# Patient Record
Sex: Male | Born: 1935 | Race: White | Hispanic: No | Marital: Single | Smoking: Never smoker
Health system: Southern US, Community
[De-identification: ages and names within clinical notes are randomized; demographics above are authoritative.]

## PROBLEM LIST (undated history)

## (undated) DIAGNOSIS — N400 Enlarged prostate without lower urinary tract symptoms: Secondary | ICD-10-CM

## (undated) DIAGNOSIS — E039 Hypothyroidism, unspecified: Secondary | ICD-10-CM

## (undated) DIAGNOSIS — F0390 Unspecified dementia without behavioral disturbance: Secondary | ICD-10-CM

## (undated) DIAGNOSIS — E079 Disorder of thyroid, unspecified: Secondary | ICD-10-CM

## (undated) DIAGNOSIS — I482 Chronic atrial fibrillation, unspecified: Secondary | ICD-10-CM

## (undated) DIAGNOSIS — G4733 Obstructive sleep apnea (adult) (pediatric): Secondary | ICD-10-CM

## (undated) DIAGNOSIS — N1831 Chronic kidney disease, stage 3a: Secondary | ICD-10-CM

## (undated) DIAGNOSIS — I2699 Other pulmonary embolism without acute cor pulmonale: Secondary | ICD-10-CM

## (undated) DIAGNOSIS — E119 Type 2 diabetes mellitus without complications: Secondary | ICD-10-CM

---

## 2008-06-29 ENCOUNTER — Ambulatory Visit: Payer: Self-pay | Admitting: Oncology

## 2008-07-05 ENCOUNTER — Ambulatory Visit: Payer: Self-pay | Admitting: Oncology

## 2008-07-30 ENCOUNTER — Ambulatory Visit: Payer: Self-pay | Admitting: Oncology

## 2008-10-07 ENCOUNTER — Emergency Department: Payer: Self-pay | Admitting: Emergency Medicine

## 2008-10-20 ENCOUNTER — Emergency Department: Payer: Self-pay | Admitting: Emergency Medicine

## 2008-11-15 ENCOUNTER — Ambulatory Visit: Payer: Self-pay | Admitting: Unknown Physician Specialty

## 2013-10-30 ENCOUNTER — Inpatient Hospital Stay: Payer: Self-pay | Admitting: Internal Medicine

## 2013-10-30 LAB — BASIC METABOLIC PANEL
Anion Gap: 9 (ref 7–16)
BUN: 35 mg/dL — ABNORMAL HIGH (ref 7–18)
CALCIUM: 8.8 mg/dL (ref 8.5–10.1)
CO2: 21 mmol/L (ref 21–32)
Chloride: 97 mmol/L — ABNORMAL LOW (ref 98–107)
Creatinine: 2.3 mg/dL — ABNORMAL HIGH (ref 0.60–1.30)
EGFR (African American): 30 — ABNORMAL LOW
GFR CALC NON AF AMER: 26 — AB
GLUCOSE: 377 mg/dL — AB (ref 65–99)
OSMOLALITY: 279 (ref 275–301)
Potassium: 5.9 mmol/L — ABNORMAL HIGH (ref 3.5–5.1)
Sodium: 127 mmol/L — ABNORMAL LOW (ref 136–145)

## 2013-10-30 LAB — HEPATIC FUNCTION PANEL A (ARMC)
Albumin: 3.6 g/dL (ref 3.4–5.0)
Alkaline Phosphatase: 54 U/L
Bilirubin, Direct: 0.2 mg/dL (ref 0.00–0.20)
Bilirubin,Total: 0.7 mg/dL (ref 0.2–1.0)
SGOT(AST): 128 U/L — ABNORMAL HIGH (ref 15–37)
SGPT (ALT): 55 U/L (ref 12–78)
Total Protein: 7.9 g/dL (ref 6.4–8.2)

## 2013-10-30 LAB — CBC WITH DIFFERENTIAL/PLATELET
BASOS ABS: 0 10*3/uL (ref 0.0–0.1)
Basophil %: 0.2 %
Eosinophil #: 0 10*3/uL (ref 0.0–0.7)
Eosinophil %: 0 %
HCT: 48.6 % (ref 40.0–52.0)
HGB: 16.3 g/dL (ref 13.0–18.0)
LYMPHS PCT: 2.4 %
Lymphocyte #: 0.3 10*3/uL — ABNORMAL LOW (ref 1.0–3.6)
MCH: 31.3 pg (ref 26.0–34.0)
MCHC: 33.6 g/dL (ref 32.0–36.0)
MCV: 93 fL (ref 80–100)
MONO ABS: 0.3 x10 3/mm (ref 0.2–1.0)
MONOS PCT: 3.1 %
NEUTROS ABS: 10.5 10*3/uL — AB (ref 1.4–6.5)
Neutrophil %: 94.3 %
Platelet: 163 10*3/uL (ref 150–440)
RBC: 5.2 10*6/uL (ref 4.40–5.90)
RDW: 14.9 % — AB (ref 11.5–14.5)
WBC: 11.1 10*3/uL — AB (ref 3.8–10.6)

## 2013-10-30 LAB — URINALYSIS, COMPLETE
BILIRUBIN, UR: NEGATIVE
Bacteria: NONE SEEN
Glucose,UR: >=500 mg/dL (ref 0–75)
Hyaline Cast: 10
LEUKOCYTE ESTERASE: NEGATIVE
Nitrite: NEGATIVE
Ph: 5 (ref 4.5–8.0)
Protein: 30
Specific Gravity: 1.024 (ref 1.003–1.030)
Squamous Epithelial: NONE SEEN

## 2013-10-30 LAB — TROPONIN I

## 2013-10-30 LAB — CK: CK, Total: 7364 U/L — ABNORMAL HIGH (ref 35–232)

## 2013-10-30 LAB — LIPASE, BLOOD: Lipase: 283 U/L (ref 73–393)

## 2013-10-30 IMAGING — CR DG CHEST 2V
1 series · 2 of 2 positions shown · non-contrast
Comparison: None.

CLINICAL DATA: Weakness.  Fever.

EXAM:
CHEST  2 VIEW

[Series 1: x chest ap · 0.14mm/px · 2 of 2 slices shown]
[im 1/2]
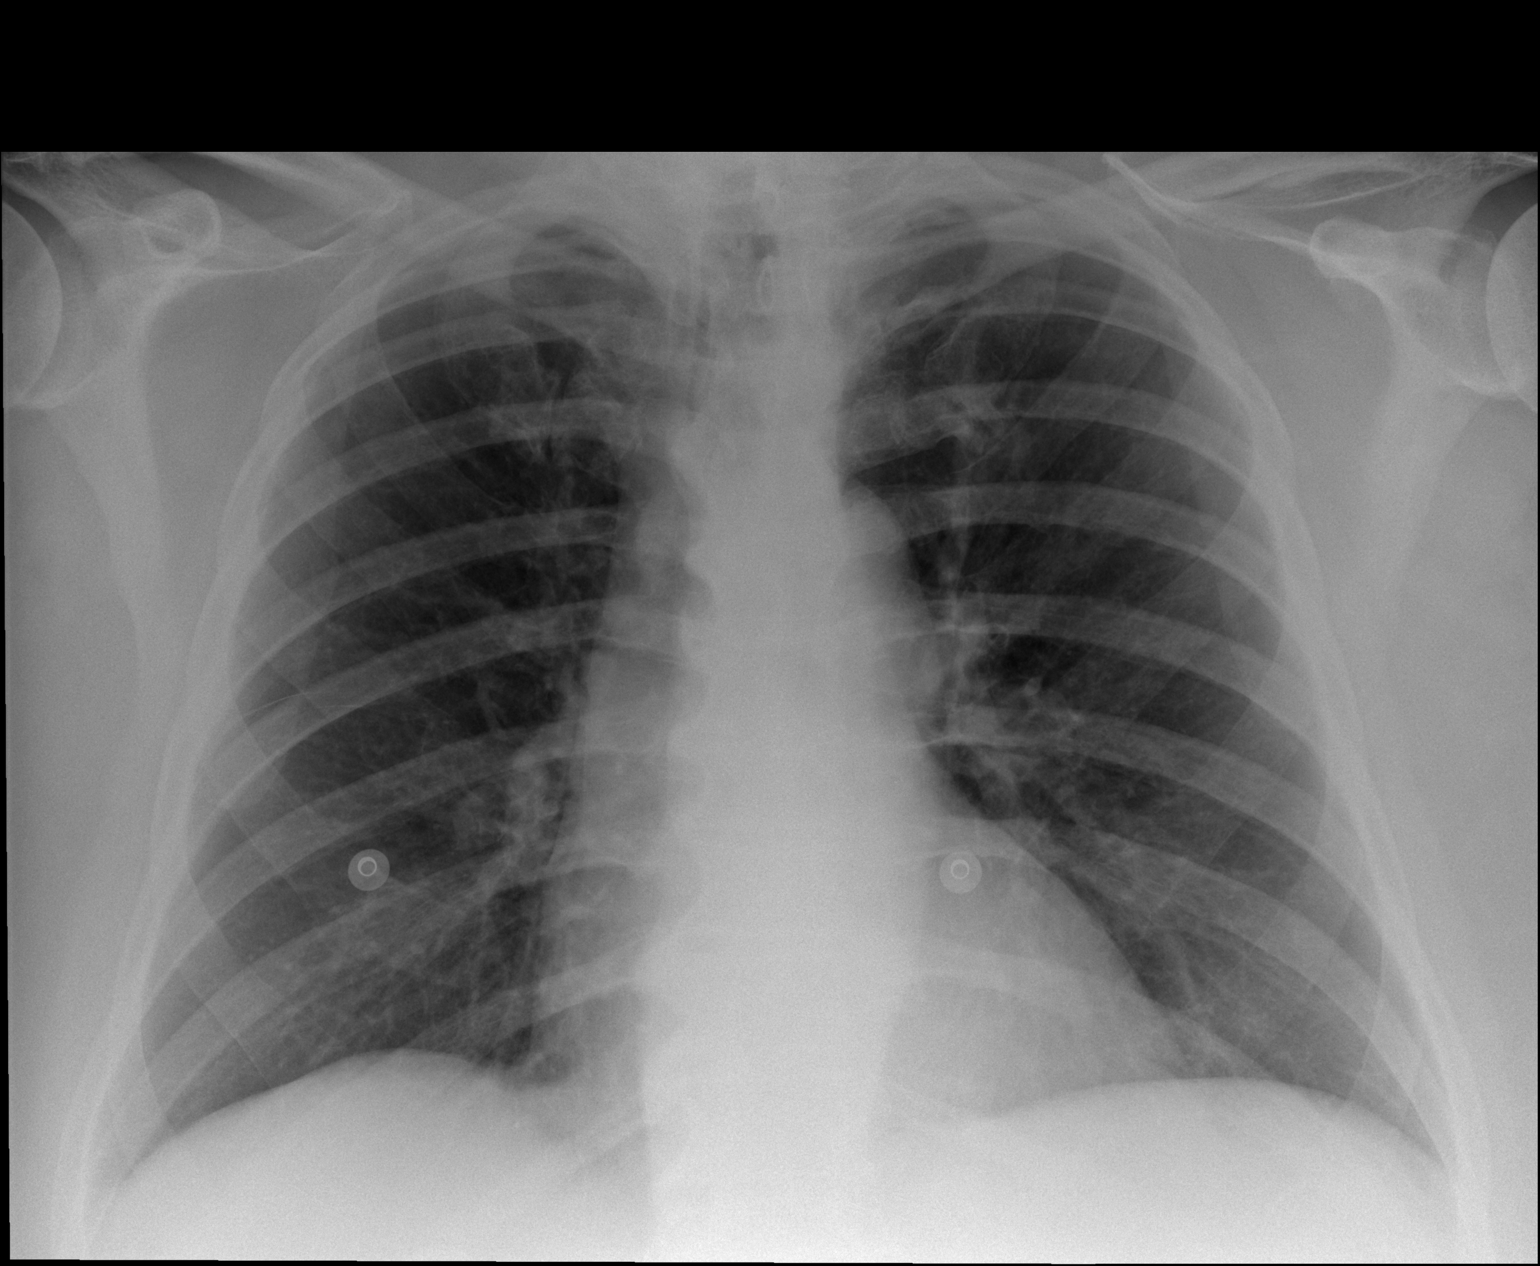
[im 2/2]
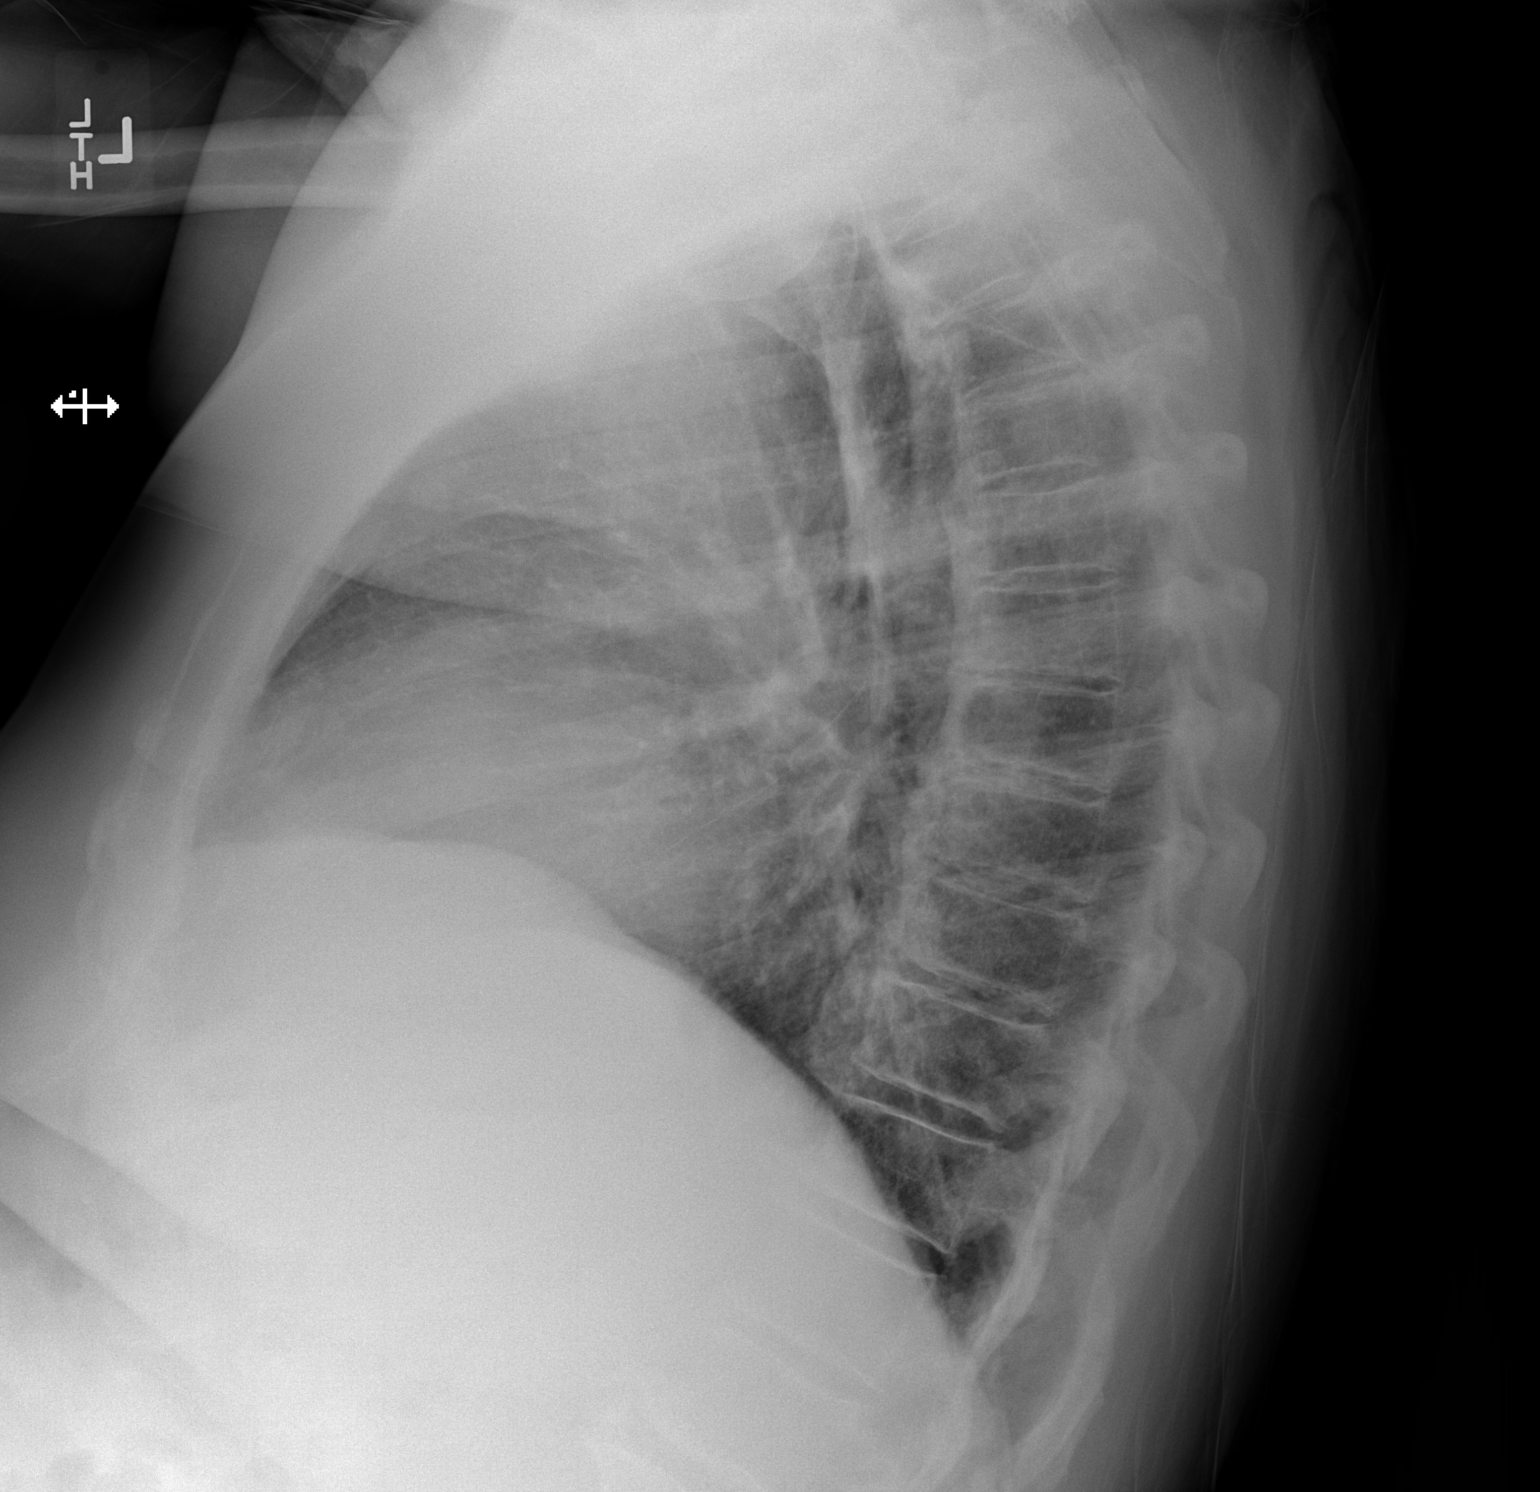

[2 of 2 positions shown; findings below may reference images not displayed]

FINDINGS: The heart size and mediastinal contours are within normal limits.
Both lungs are clear. The bony thorax is intact.
IMPRESSION: No active cardiopulmonary disease.

## 2013-10-31 LAB — CBC WITH DIFFERENTIAL/PLATELET
Basophil #: 0 10*3/uL (ref 0.0–0.1)
Basophil %: 0.3 %
EOS PCT: 0.4 %
Eosinophil #: 0 10*3/uL (ref 0.0–0.7)
HCT: 42.6 % (ref 40.0–52.0)
HGB: 14.2 g/dL (ref 13.0–18.0)
Lymphocyte #: 1.2 10*3/uL (ref 1.0–3.6)
Lymphocyte %: 18.8 %
MCH: 30.9 pg (ref 26.0–34.0)
MCHC: 33.2 g/dL (ref 32.0–36.0)
MCV: 93 fL (ref 80–100)
MONO ABS: 0.4 x10 3/mm (ref 0.2–1.0)
Monocyte %: 6.5 %
Neutrophil #: 4.9 10*3/uL (ref 1.4–6.5)
Neutrophil %: 74 %
Platelet: 127 10*3/uL — ABNORMAL LOW (ref 150–440)
RBC: 4.58 10*6/uL (ref 4.40–5.90)
RDW: 14.6 % — ABNORMAL HIGH (ref 11.5–14.5)
WBC: 6.6 10*3/uL (ref 3.8–10.6)

## 2013-10-31 LAB — COMPREHENSIVE METABOLIC PANEL
ALK PHOS: 43 U/L — AB
AST: 326 U/L — AB (ref 15–37)
Albumin: 2.8 g/dL — ABNORMAL LOW (ref 3.4–5.0)
Anion Gap: 3 — ABNORMAL LOW (ref 7–16)
BUN: 26 mg/dL — ABNORMAL HIGH (ref 7–18)
Bilirubin,Total: 0.5 mg/dL (ref 0.2–1.0)
Calcium, Total: 8 mg/dL — ABNORMAL LOW (ref 8.5–10.1)
Chloride: 104 mmol/L (ref 98–107)
Co2: 25 mmol/L (ref 21–32)
Creatinine: 1.5 mg/dL — ABNORMAL HIGH (ref 0.60–1.30)
EGFR (African American): 50 — ABNORMAL LOW
GFR CALC NON AF AMER: 43 — AB
Glucose: 168 mg/dL — ABNORMAL HIGH (ref 65–99)
Osmolality: 273 (ref 275–301)
Potassium: 4.1 mmol/L (ref 3.5–5.1)
SGPT (ALT): 94 U/L — ABNORMAL HIGH (ref 12–78)
SODIUM: 132 mmol/L — AB (ref 136–145)
Total Protein: 6.4 g/dL (ref 6.4–8.2)

## 2013-10-31 LAB — CK: CK, Total: 13228 U/L — ABNORMAL HIGH (ref 35–232)

## 2013-10-31 LAB — CLOSTRIDIUM DIFFICILE(ARMC)

## 2013-11-01 LAB — CBC WITH DIFFERENTIAL/PLATELET
Basophil #: 0 10*3/uL (ref 0.0–0.1)
Basophil %: 0.2 %
EOS ABS: 0.1 10*3/uL (ref 0.0–0.7)
Eosinophil %: 1 %
HCT: 42 % (ref 40.0–52.0)
HGB: 13.8 g/dL (ref 13.0–18.0)
LYMPHS ABS: 2.3 10*3/uL (ref 1.0–3.6)
Lymphocyte %: 28.8 %
MCH: 30.5 pg (ref 26.0–34.0)
MCHC: 32.8 g/dL (ref 32.0–36.0)
MCV: 93 fL (ref 80–100)
MONOS PCT: 7.7 %
Monocyte #: 0.6 x10 3/mm (ref 0.2–1.0)
Neutrophil #: 4.9 10*3/uL (ref 1.4–6.5)
Neutrophil %: 62.3 %
Platelet: 128 10*3/uL — ABNORMAL LOW (ref 150–440)
RBC: 4.53 10*6/uL (ref 4.40–5.90)
RDW: 14.4 % (ref 11.5–14.5)
WBC: 7.9 10*3/uL (ref 3.8–10.6)

## 2013-11-01 LAB — COMPREHENSIVE METABOLIC PANEL
ANION GAP: 2 — AB (ref 7–16)
Albumin: 2.8 g/dL — ABNORMAL LOW (ref 3.4–5.0)
Alkaline Phosphatase: 45 U/L
BUN: 17 mg/dL (ref 7–18)
Bilirubin,Total: 0.4 mg/dL (ref 0.2–1.0)
CHLORIDE: 106 mmol/L (ref 98–107)
CO2: 27 mmol/L (ref 21–32)
Calcium, Total: 8.5 mg/dL (ref 8.5–10.1)
Creatinine: 1.38 mg/dL — ABNORMAL HIGH (ref 0.60–1.30)
EGFR (Non-African Amer.): 48 — ABNORMAL LOW
GFR CALC AF AMER: 56 — AB
GLUCOSE: 177 mg/dL — AB (ref 65–99)
Osmolality: 276 (ref 275–301)
Potassium: 4.5 mmol/L (ref 3.5–5.1)
SGOT(AST): 277 U/L — ABNORMAL HIGH (ref 15–37)
SGPT (ALT): 98 U/L — ABNORMAL HIGH (ref 12–78)
Sodium: 135 mmol/L — ABNORMAL LOW (ref 136–145)
Total Protein: 6.4 g/dL (ref 6.4–8.2)

## 2013-11-01 LAB — CK: CK, TOTAL: 6962 U/L — AB

## 2013-11-02 LAB — COMPREHENSIVE METABOLIC PANEL
ALK PHOS: 50 U/L
ANION GAP: 14 (ref 7–16)
Albumin: 2.7 g/dL — ABNORMAL LOW (ref 3.4–5.0)
BUN: 15 mg/dL (ref 7–18)
Bilirubin,Total: 0.4 mg/dL (ref 0.2–1.0)
CALCIUM: 8.3 mg/dL — AB (ref 8.5–10.1)
Chloride: 106 mmol/L (ref 98–107)
Co2: 16 mmol/L — ABNORMAL LOW (ref 21–32)
Creatinine: 1.25 mg/dL (ref 0.60–1.30)
EGFR (African American): >60
GFR CALC NON AF AMER: 54 — AB
GLUCOSE: 160 mg/dL — AB (ref 65–99)
Osmolality: 276 (ref 275–301)
POTASSIUM: 4.1 mmol/L (ref 3.5–5.1)
SGOT(AST): 240 U/L — ABNORMAL HIGH (ref 15–37)
SGPT (ALT): 93 U/L — ABNORMAL HIGH (ref 12–78)
Sodium: 136 mmol/L (ref 136–145)
Total Protein: 6.2 g/dL — ABNORMAL LOW (ref 6.4–8.2)

## 2013-11-02 LAB — CBC WITH DIFFERENTIAL/PLATELET
BASOS PCT: 0.4 %
Basophil #: 0 10*3/uL (ref 0.0–0.1)
EOS PCT: 4.6 %
Eosinophil #: 0.3 10*3/uL (ref 0.0–0.7)
HCT: 41.7 % (ref 40.0–52.0)
HGB: 13.6 g/dL (ref 13.0–18.0)
LYMPHS ABS: 2.2 10*3/uL (ref 1.0–3.6)
Lymphocyte %: 30.1 %
MCH: 30.2 pg (ref 26.0–34.0)
MCHC: 32.7 g/dL (ref 32.0–36.0)
MCV: 92 fL (ref 80–100)
MONO ABS: 0.6 x10 3/mm (ref 0.2–1.0)
MONOS PCT: 8.5 %
NEUTROS ABS: 4.2 10*3/uL (ref 1.4–6.5)
Neutrophil %: 56.4 %
Platelet: 132 10*3/uL — ABNORMAL LOW (ref 150–440)
RBC: 4.52 10*6/uL (ref 4.40–5.90)
RDW: 14.8 % — ABNORMAL HIGH (ref 11.5–14.5)
WBC: 7.4 10*3/uL (ref 3.8–10.6)

## 2013-11-02 LAB — CK: CK, Total: 4582 U/L — ABNORMAL HIGH

## 2013-11-06 ENCOUNTER — Inpatient Hospital Stay: Payer: Self-pay | Admitting: Internal Medicine

## 2013-11-06 LAB — URINALYSIS, COMPLETE
Bacteria: NEGATIVE
Bilirubin,UR: NEGATIVE
Blood: NEGATIVE
Glucose,UR: >=500 mg/dL (ref 0–75)
KETONE: NEGATIVE
LEUKOCYTE ESTERASE: NEGATIVE
Nitrite: NEGATIVE
PH: 5 (ref 4.5–8.0)
PROTEIN: NEGATIVE
RBC,UR: NONE SEEN /HPF (ref 0–5)
SPECIFIC GRAVITY: 1.018 (ref 1.003–1.030)

## 2013-11-06 LAB — BASIC METABOLIC PANEL
Anion Gap: 3 — ABNORMAL LOW (ref 7–16)
BUN: 22 mg/dL — ABNORMAL HIGH (ref 7–18)
CALCIUM: 8.9 mg/dL (ref 8.5–10.1)
CHLORIDE: 100 mmol/L (ref 98–107)
CO2: 26 mmol/L (ref 21–32)
Creatinine: 1.54 mg/dL — ABNORMAL HIGH (ref 0.60–1.30)
EGFR (African American): 49 — ABNORMAL LOW
GFR CALC NON AF AMER: 42 — AB
Glucose: 201 mg/dL — ABNORMAL HIGH (ref 65–99)
Osmolality: 268 (ref 275–301)
POTASSIUM: 4.7 mmol/L (ref 3.5–5.1)
Sodium: 129 mmol/L — ABNORMAL LOW (ref 136–145)

## 2013-11-06 LAB — CBC
HCT: 42.6 % (ref 40.0–52.0)
HGB: 14.2 g/dL (ref 13.0–18.0)
MCH: 30.5 pg (ref 26.0–34.0)
MCHC: 33.4 g/dL (ref 32.0–36.0)
MCV: 91 fL (ref 80–100)
Platelet: 201 10*3/uL (ref 150–440)
RBC: 4.67 10*6/uL (ref 4.40–5.90)
RDW: 14.6 % — ABNORMAL HIGH (ref 11.5–14.5)
WBC: 10 10*3/uL (ref 3.8–10.6)

## 2013-11-06 LAB — TROPONIN I: Troponin-I: <0.02 ng/mL

## 2013-11-06 IMAGING — CR DG CHEST 1V PORT
1 series · 1 of 1 positions shown · non-contrast
Comparison: [DATE].

CLINICAL DATA: Lower limb weakness with cough and unregulated blood
sugars. History of hypertension and diabetes.

EXAM:
PORTABLE CHEST - 1 VIEW

[ap]
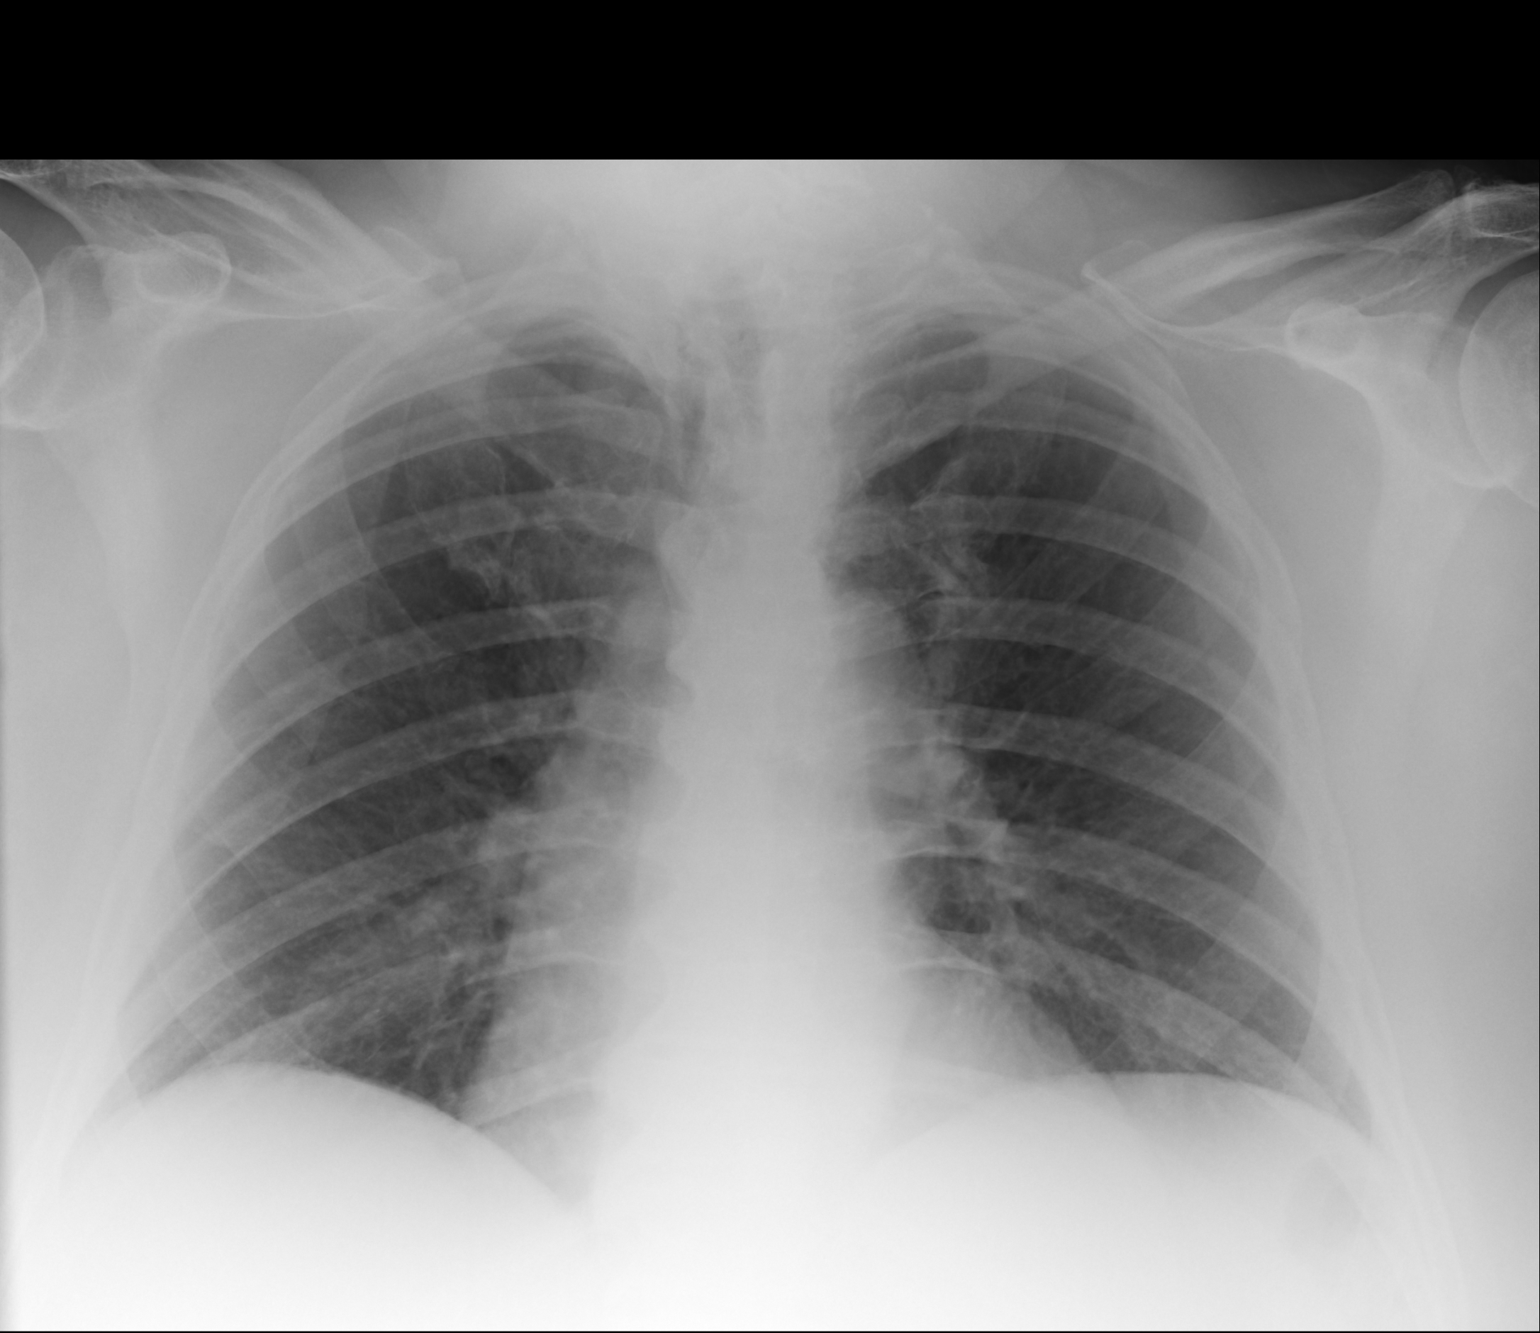

[1 of 1 positions shown; findings below may reference images not displayed]

FINDINGS: [NX] hr. There are persistent low lung volumes with mildly increased
bibasilar atelectasis. No consolidation, edema or significant
pleural effusion is seen. The heart size and mediastinal contours
are stable. There are osteophytes throughout the thoracic spine.
IMPRESSION: Lower lung volumes with increased basilar atelectasis. No evidence
of pneumonia.

## 2013-11-07 LAB — CBC WITH DIFFERENTIAL/PLATELET
BASOS PCT: 0.5 %
Basophil #: 0 10*3/uL (ref 0.0–0.1)
Eosinophil #: 0.1 10*3/uL (ref 0.0–0.7)
Eosinophil %: 1.5 %
HCT: 40.9 % (ref 40.0–52.0)
HGB: 13.8 g/dL (ref 13.0–18.0)
LYMPHS PCT: 25.9 %
Lymphocyte #: 2.1 10*3/uL (ref 1.0–3.6)
MCH: 31.2 pg (ref 26.0–34.0)
MCHC: 33.8 g/dL (ref 32.0–36.0)
MCV: 92 fL (ref 80–100)
Monocyte #: 1.1 x10 3/mm — ABNORMAL HIGH (ref 0.2–1.0)
Monocyte %: 13 %
Neutrophil #: 4.8 10*3/uL (ref 1.4–6.5)
Neutrophil %: 59.1 %
Platelet: 189 10*3/uL (ref 150–440)
RBC: 4.43 10*6/uL (ref 4.40–5.90)
RDW: 14.5 % (ref 11.5–14.5)
WBC: 8.1 10*3/uL (ref 3.8–10.6)

## 2013-11-07 LAB — LIPID PANEL
Cholesterol: 173 mg/dL (ref 0–200)
HDL Cholesterol: 38 mg/dL — ABNORMAL LOW (ref 40–60)
Ldl Cholesterol, Calc: 106 mg/dL — ABNORMAL HIGH (ref 0–100)
Triglycerides: 147 mg/dL (ref 0–200)
VLDL CHOLESTEROL, CALC: 29 mg/dL (ref 5–40)

## 2013-11-07 LAB — BASIC METABOLIC PANEL
Anion Gap: 4 — ABNORMAL LOW (ref 7–16)
BUN: 17 mg/dL (ref 7–18)
CALCIUM: 8.7 mg/dL (ref 8.5–10.1)
CREATININE: 1.37 mg/dL — AB (ref 0.60–1.30)
Chloride: 101 mmol/L (ref 98–107)
Co2: 27 mmol/L (ref 21–32)
EGFR (African American): 56 — ABNORMAL LOW
GFR CALC NON AF AMER: 48 — AB
GLUCOSE: 149 mg/dL — AB (ref 65–99)
Osmolality: 269 (ref 275–301)
Potassium: 4.1 mmol/L (ref 3.5–5.1)
SODIUM: 132 mmol/L — AB (ref 136–145)

## 2013-11-07 LAB — TSH: THYROID STIMULATING HORM: 1.89 u[IU]/mL

## 2013-11-07 LAB — RAPID INFLUENZA A&B ANTIGENS (ARMC ONLY)

## 2013-11-09 LAB — BASIC METABOLIC PANEL
ANION GAP: 7 (ref 7–16)
BUN: 17 mg/dL (ref 7–18)
Calcium, Total: 8.5 mg/dL (ref 8.5–10.1)
Chloride: 99 mmol/L (ref 98–107)
Co2: 25 mmol/L (ref 21–32)
Creatinine: 1.28 mg/dL (ref 0.60–1.30)
EGFR (African American): >60
GFR CALC NON AF AMER: 53 — AB
Glucose: 188 mg/dL — ABNORMAL HIGH (ref 65–99)
OSMOLALITY: 269 (ref 275–301)
POTASSIUM: 4 mmol/L (ref 3.5–5.1)
Sodium: 131 mmol/L — ABNORMAL LOW (ref 136–145)

## 2013-11-10 ENCOUNTER — Encounter: Payer: Self-pay | Admitting: Internal Medicine

## 2013-11-27 ENCOUNTER — Encounter: Payer: Self-pay | Admitting: Internal Medicine

## 2013-12-01 LAB — HEMOGLOBIN A1C: Hemoglobin A1C: 9 % — ABNORMAL HIGH (ref 4.2–6.3)

## 2014-03-13 DIAGNOSIS — N183 Chronic kidney disease, stage 3 unspecified: Secondary | ICD-10-CM | POA: Insufficient documentation

## 2014-11-08 ENCOUNTER — Observation Stay: Payer: Self-pay | Admitting: Internal Medicine

## 2015-01-20 NOTE — H&P (Signed)
PATIENT NAME:  Cory [name], Cory [name] MR#:  [id number] DATE OF BIRTH:  1930-10-04  DATE OF ADMISSION:  10/30/2013  An 81 year old male presents with acute renal failure, diarrhea, and near syncope with fever and chills. He was in his usual state of health, until Friday when he started having some loose bowels and epigastric pain, yesterday it started. Had 3 to 4 bouts of diarrhea this morning. Had fever and chills with more explosive diarrhea creating lethargy and lightheadedness with near syncope. The patient really could not get off the floor. At baseline, creatinine 1.6 and it takes quite a bit of work for him to stand up due to knee arthritis. Now his creatinine is up to 2.6, potassium 5.6 with his continuing diarrheal illness, acute renal failure and rhabdomyolysis with CK of 7000, he will be admitted for further evaluation and treatment.   PAST MEDICAL HISTORY: Diabetes mellitus, insulin requiring. Hyperlipidemia. CKD 3, baseline creatinine 1.6.   PAST SURGICAL HISTORY: Right cataract.   ALLERGIES: None.   MEDICATIONS: Lantus 15 units daily. NovoLog insulin 16 units before breakfast 8 units before supper, Flomax 0.4 mg daily. Aspirin 81 mg daily. Quinapril 20 mg daily, simvastatin 40 mg at bedtime.  Omega 3 1200 mg daily. Calcium vitamin D b.i.d.   SOCIAL HISTORY: Married, occasional alcohol. No smoking.   FAMILY HISTORY: Significant for diabetes, heart disease.   REVIEW OF SYSTEMS: No chest pain, minimal abdominal pain. No current chills, otherwise negative.   PHYSICAL EXAMINATION:  VITAL SIGNS: Blood pressure 118/70, pulse 100 and regular.  HEENT: Normal oropharynx. TMs normal.  NECK: No adenopathy.  LUNGS: Clear.   HEART: Regular rhythm. No audible murmur.  ABDOMEN: Good bowel sounds, soft mild epigastric tenderness. No rebound tenderness.  EXTREMITIES: No edema.  SKIN: No rash.  NEUROLOGIC: Nonfocal. Good hand grip.   LABS: White count elevated. Creatinine at 2.3, potassium 5.6. Chest  x-ray is a normal. Sugar 377, CK 73, 64.   ASSESSMENT AND PLAN: 1. Acute renal failure, related to volume depletion and rhabdomyolysis. Acute volume replacement with normal saline, that should resolve his hyponatremia, hyperkalemia and renal dysfunction. Baseline creatinine 1.6. Follow CKs closely. No sign of acidosis. Elevated SGOT likely from the viral illness. We will re-evaluate that in the morning as well. 2. Diarrheal illness: Checked for C. difficile. Empiric Cipro and Flagyl, IV Protonix.  3. Diabetes-insulin requiring, with very little p.o. intake. We will do sliding scale insulin and follow closely. Overall prognosis is guarded.    ____________________________ Cory PentonMark F. Reise Gladney, Cory [name] mfm:sg D: 10/30/2013 12:25:36 ET T: 10/30/2013 14:01:13 ET JOB#: [id number]  cc: Cory PentonMark F. Dariel Pellecchia, Cory [name], <Dictator> Cory [name] ELECTRONICALLY SIGNED 10/31/2013 8:02

## 2015-01-20 NOTE — H&P (Signed)
PATIENT NAME:  Cory [name], Cory [name] MR#:  [id number] DATE OF BIRTH:  1931/01/19  DATE OF ADMISSION:  11/06/2013  PRIMARY CARE PHYSICIAN: Yates Decamp, MD   CHIEF COMPLAINT: Generalized weakness, frequent falls.   HISTORY OF PRESENT ILLNESS: Cory [name] is an 81 year old morbidly obese male who was recently admitted on 1st of February 2015 and was discharged 5th of February 2015 for frequent falls, rhabdomyolysis, and syncope. The patient was having severe generalized weakness at the time, however, there was some improvement with physical therapy. Concerning this the patient was sent home with home health. During the admission, the patient had rhabdomyolysis with a CK of 7000 and creatinine of 2.6. The patient also states that the patient has poorly controlled blood sugars. Per family, the patient is noncompliant with the diet. Having polyuria, polydipsia. With IV fluids the patient's creatinine improved to 1.54. Since the discharge, the patient was unable to participate in the physical therapy, continued to become more dependent on wife who was unable to care for the patient. Concerning this the patient is brought to the Emergency Department. The patient has mild cough. No productive sputum. The patient is found to have mild hyponatremia of 129. The patient's baseline creatinine is 136. No obvious sign of infections are found.   PAST MEDICAL HISTORY: 1.  Diabetes mellitus, insulin-dependent.  2.  Hypertension.  3.  Hyperlipidemia.  4.  Chronic kidney disease with baseline creatinine of 1.6.  5.  Morbid obesity.   PAST SURGICAL HISTORY: Right cataract.   ALLERGIES: No known drug allergies.   HOME MEDICATIONS: 1.  Lantus 15 units daily.  2.  NovoLog 16 units before breakfast, 8 units before supper.  3.  Flomax 0.4 mg daily.  4.  Aspirin 81 mg daily.  5.  Quinapril 20 mg daily.  6.  Simvastatin 40 mg at bedtime.  7.  Omega 3 fatty acids 1200 mg daily.  8.  Calcium with vitamin D 2 times a day.    SOCIAL HISTORY: No history of smoking, drinking alcohol, or using illicit drugs. Married. Lives with his wife.   FAMILY HISTORY: Diabetes mellitus and hypertension.   REVIEW OF SYSTEMS: CONSTITUTIONAL: Severe generalized weakness. No weight loss.  EYES: No change in vision.  ENT: No change in hearing. No sore throat. RESPIRATORY: Has cough and shortness of breath.  CARDIOVASCULAR: No chest pain, palpitations. GENITOURINARY: No nausea, vomiting, abdominal pain.  ENDOCRINE: Has polyuria and polydipsia. Has diabetes mellitus, poorly controlled.  SKIN: No rash or lesions.  HEMATOLOGIC: No easy bruising or bleeding.  MUSCULOSKELETAL: Has generalized body aches. NEUROLOGIC: Has generalized weakness. No focal signs of any weakness.   PHYSICAL EXAMINATION: GENERAL: This is a well-built, well-nourished morbidly obese male lying down in the bed, not in distress.  VITAL SIGNS: Temperature 98.1, pulse 88, blood pressure 133/60, respiratory rate 16, oxygen saturation is 95% on room air.  HEENT: Head normocephalic, atraumatic. Eyes: No scleral icterus. Conjunctivae normal. Pupils equal and react to light. Extraocular movements are intact. Mucous membranes moist. No pharyngeal erythema.  NECK: Supple. No lymphadenopathy. No JVD. No carotid bruit.  CHEST: Has no focal tenderness.  LUNGS: Bilaterally clear to auscultation.  HEART: S1 and S2 regular. No murmurs are heard.  EXTREMITIES: Left lower extremity has more pedal edema than the right. ABDOMEN: Bowel sounds present. Soft, nontender, nondistended. Could not appreciate any hepatosplenomegaly secondary to patient's obesity. NEUROLOGIC: The patient is alert and oriented to place, person and time. Cranial nerves II through XII intact. Motor 5/5 in  upper and lower extremities.  SKIN: No rash or lesions.  MUSCULOSKELETAL: Good range of motion in all the extremities.   LABORATORY AND DIAGNOSTICS: CBC: WBC 11.1, hemoglobin 16.3, platelet count 163.  CMP: BUN , creatinine 1.54, sodium 129. The rest of all the values are within normal limits.   Chest x-ray, 1 view, portable: Lower lung volumes with increased basilar atelectasis. No evidence of pneumonia.   ASSESSMENT AND PLAN: Cory [name] is an 81 year old male who comes to the Emergency Department with complaints of severe generalized weakness.  1.  Severe debility. Will involve physical therapy and occupational therapy. Most likely secondary to deconditioning although poorly controlled blood sugars also could have contributed to this. The patient's family wants him to be admitted to skilled nursing facility.   2.  Hyponatremia. Most likely secondary to hypovolemic hyponatremia. However, the patient's urine sodium may not be accurate as the patient is on Lasix. Will hold the Lasix and follow up.  3.  Obesity. Counseled with the patient regarding diet and exercise.  4.  Diabetes mellitus, insulin-dependent. Continue the current dose of the insulin and follow up.  5.  Keep the patient on deep vein thrombosis prophylaxis with Lovenox.   TIME SPENT: 50 minutes.  ____________________________ Susa GriffinsPadmaja Jaxxon Naeem, MD pv:sb D: 11/06/2013 23:16:11 ET T: 11/07/2013 08:43:38 ET JOB#: [id number]  cc: Susa GriffinsPadmaja Tahirah Sara, MD, <Dictator> John B. Danne HarborWalker III, MD Clerance LavPADMAJA Ahlivia Salahuddin MD ELECTRONICALLY SIGNED 12/01/2013 1:20

## 2015-01-20 NOTE — Discharge Summary (Signed)
PATIENT NAME:  Cory [name], Cory [name] MR#:  [id number] DATE OF BIRTH:  01-27-1931  DATE OF ADMISSION:  10/30/2013 DATE OF DISCHARGE:  11/03/2013  HISTORY OF PRESENT ILLNESS: Cory [name] was an 81 year old diabetic gentleman, who several days prior to admission, developed chills, fever and diarrhea. He had a near-syncopal episode and presented to the Emergency Room after his wife could not get him up off the floor. In the ER, he was found to have a creatinine of 2.6 with a potassium of 5.6. He also had a CPK of 7000. He was therefore admitted with acute renal failure and rhabdomyolysis.   PAST MEDICAL HISTORY: Notable for insulin-dependent diabetes mellitus type 2 for which he was followed by Dr. Tedd Sias. He also had a history of hyperlipidemia. The patient had known mild chronic kidney disease with a baseline creatinine of 1.6.   ALLERGIES: THE PATIENT HAD NO KNOWN DRUG ALLERGIES.   MEDICATIONS ON ADMISSION: Included Lantus insulin 15 units daily, NovoLog insulin 16 units before breakfast and 8 units before supper, Flomax 0.4 mg daily, aspirin 81 mg daily, quinapril 20 mg daily, simvastatin 40 mg at bedtime, omega-3 fish oil 1200 mg daily and calcium plus vitamin D supplement b.i.d.   PHYSICAL EXAMINATION: The patient's admission vital signs showed a blood pressure of 118/70 with a pulse 100. He was noted to have dry mucous membranes. There was soft mild epigastric tenderness with no rebound. The remainder of the examination was unremarkable, as described by the admitting physician.   DIAGNOSTIC DATA: The patient's admission CBC showed a hemoglobin of 16.3 with a hematocrit of 48.6. White count was 11,100. Platelet count was 163,000. Admission basic metabolic panel showed a random blood sugar of 377. BUN was 35 with a creatinine of 2.3. Sodium was 127. Potassium was 5.9. Chloride was 97. Estimated GFR was 26. Liver function studies were notable only for an SGOT of 128. Lipase was 283. Admission urinalysis showed  1+ ketones. There was 3+ blood on the dipstick, but the microscopic was unremarkable. Admission EKG showed a sinus rhythm with first-degree AV block. There was also bifascicular block. There was evidence of a possible old lateral infarction. Admission chest x-ray showed no active disease.   HOSPITAL COURSE: The patient was admitted to the regular medical floor, where he was rehydrated with IV fluids for his acute renal failure due to the azotemia and also for his rhabdomyolysis. His regular insulin was discontinued, and he was placed on a sliding scale while he was acutely ill. The patient responded rather rapidly to the treatment as noted above. He was seen by physical therapy and ambulated while he was in the hospital. The patient was also seen in consultation by his endocrinologist, who did adjust his insulin doses at the time of discharge.   DISCHARGE DIAGNOSES:  1. Acute viral gastroenteritis.  2. Acute renal failure secondary to #1.  3. Acute rhabdomyolysis.  4. Insulin-dependent type 2 diabetes.   DISPOSITION: The patient is being sent home on a diabetic diet with activity as tolerated. He was set up to be seen by home health at the time of discharge.   DISCHARGE MEDICATIONS: Unfortunately, the computer system is presently frozen, and I cannot load up the discharge medications. An addendum will be attached to this dictation once the system is back up and operable.    ____________________________ Letta Pate. Danne Harbor, MD jbw:lb D: 11/18/2013 11:52:00 ET T: 11/18/2013 12:46:16 ET JOB#: [id number]  cc: Jonny Ruiz B. Danne Harbor, MD, <Dictator> Elmo Putt  III MD ELECTRONICALLY SIGNED 11/21/2013 7:37

## 2015-01-20 NOTE — Discharge Summary (Signed)
PATIENT NAME:  Cory [name], Cory [name] MR#:  [id number] DATE OF BIRTH:  07/24/1931  DATE OF ADMISSION:  11/06/2013 DATE OF DISCHARGE:  11/10/2013  HISTORY OF PRESENT ILLNESS: Mr. Cory [name] is an 81 year old diabetic who had been admitted on the 1st of February and discharged on the 5th after an admission for generalized weakness and frequent falls secondary to a gastroenteritis. He was sent home with home health. He was doing well at the time of discharge and according to his wife was eating and drinking well after discharge, but on the day of admission was feeling extremely weak and she found him on the floor and was unable to get him up. He was brought to the Emergency Room where he was found to have hyponatremia of 129. He was found to be dead weight and was therefore admitted for further therapy and physical reconditioning.   The patient's past medical history was notable for insulin-dependent diabetes mellitus, essential hypertension, hyperlipidemia, chronic kidney disease with a baseline creatinine 1.6, and morbid obesity.   The patient had no known drug allergies.   The patient's medications included aspirin 81 mg daily, Flomax 0.4 mg daily, NovoLog 16 units before breakfast and 8 units before supper, and Lantus 15 units daily. In addition, the patient was on quinapril 20 mg daily, simvastatin 40 mg at bedtime, omega-3 fatty acids 120 mg daily and calcium with vitamin D 1 tablet b.i.d.   The patient's admission vital signs revealed a temperature of 98, a pulse of 88, a blood pressure of 133/60, and a respiratory rate of 16. Pulse ox was 95% on room air. In general, the patient was morbidly obese. He had a little peripheral edema that appeared to be stasis, greater on the right. The remainder of the exam was basically unremarkable with the exception of the patient's generalized weakness.   Admission CBC showed a hemoglobin of 14.2 with a hematocrit of 42.6. White count was 10,000. Platelet count was 201,000.  Admission basic metabolic panel showed a random blood sugar of 201, a BUN of 22, a creatinine of 1.54, a sodium of 129, a potassium of 4.7, a chloride of 100, and a CO2 of 26. Estimated GFR was 42. Admission urinalysis was unremarkable. Screening for influenza A and B was negative. The EKG showed a sinus rhythm with bifascicular block. Premature atrial complexes were noted. Admission chest x-ray showed bibasilar atelectasis, but was otherwise unremarkable.   HOSPITAL COURSE: The patient was admitted to the regular medical floor where he was rehydrated with IV fluids. He was placed on normal saline to bring his sodium back up. He was seen in consultation by physical therapy for reconditioning. After admission he did run a low-grade fever. He was noted to have a productive cough. As noted above, chest x-ray was unremarkable, but he was placed on Levaquin for suspected bronchitis. The patient was kept on a sliding scale of insulin during his hospitalization. He will need to be placed back on his regular insulin regimen once he is back up and ambulatory. The patient's final metabolic panel prior to discharge showed a BUN of 17 with a creatinine 1.28. Sodium was 131. Potassium was 4. Chloride was 99. CO2 was 25.   DISCHARGE DIAGNOSES: 1.  Acute hyponatremia secondary to failure to thrive.  2.  Acute bronchitis.  3.  Insulin-dependent diabetes mellitus.  4.  Hypertension.   DISCHARGE MEDICATIONS: 1.  Aspirin 81 mg daily.  2.  Quinapril 20 mg daily.  3.  Flomax 0.4 mg daily.  4.  Simvastatin 40 mg at bedtime.  5.  Hycodan 5 mL every 6 hours p.r.n. cough.  6.  Levaquin 250 mg daily for an additional 7 days.  7.  Tylenol 650 mg every 4 hours as needed.  8.  Nystatin cream applied to effected area t.i.d.   PLAN: The patient is being transferred on an 1800 calorie low fat, low cholesterol diet. He will be continued on sliding scale until he is back fully ambulatory. It is anticipated that the patient will  eventually return home. He is a FULL code.  ____________________________ Letta Pate. Danne Harbor, MD jbw:sb D: 11/10/2013 07:49:03 ET T: 11/10/2013 08:04:45 ET JOB#: [id number]  cc: Jonny Ruiz B. Danne Harbor, MD, <Dictator> Elmo Putt III MD ELECTRONICALLY SIGNED 11/13/2013 14:24

## 2015-01-20 NOTE — Consult Note (Signed)
PATIENT NAME:  LEVELL, TAVANO MR#:  [id number] DATE OF BIRTH:  May 27, 1931  DATE OF ADMISSION:  10/30/2013 DATE OF CONSULTATION:  11/01/2013  REFERRING PHYSICIAN:  Lisette Grinder, MD CONSULTING PHYSICIAN:  A. Lavone Orn, MD  CHIEF COMPLAINT: Diabetes.   HISTORY OF PRESENT ILLNESS: This is an 81 year old male who was admitted for rhabdomyolysis and acute renal failure on February 01. He had woke from sleep and went to the bathroom. After using the bathroom, he stood and fell to the ground. He was unable to get up. He explains he had severe weakness. He tried for several hours to get up and struggled until his wife found him. Throughout the day prior he was in his usual state of health. He is fairly sedentary but had taken his dog out several times for a brief walk. On being brought to the ER, he was found to have creatinine elevated at 2.6, hyperkalemia and an elevated CK of 7000, in addition to an elevated blood sugar of 377. He has been treated with IV fluids with improvement in his renal function and CK level. For diabetes, he has been given NovoLog insulin sliding scale. As an outpatient, his treatment was Lantus 20 units subQ daily and NovoLog 10 units at breakfast and 8 units at supper. This regimen was recently changed from a regimen of Humalog 75/25 mix, 12 units at breakfast and 15 units at supper; Victoza 0.6 mg daily and glipizide 10 mg daily. He states since his prior medications for diabetes were stopped and replaced with Lantus and NovoLog, sugars have been consistently high. He was having diarrhea with use of Victoza, which has signifcantly improved in recent weeks.  PAST MEDICAL HISTORY: 1.  Type 2 diabetes.  2.  Hyperlipidemia.  3.  Renal insufficiency   PAST SURGICAL HISTORY:   1.  Cataract repair.   CURRENT MEDICATIONS: 1.  NovoLog insulin sliding scale.  2.  Ciprofloxacin 400 mg q.12 hours.  3.  Flagyl 500 mg q.8 hours.  4.  Protonix 40 mg q.12 hours.  5.  Heparin 5000 units  subQ q.12 hours.  6.  Normal saline 0.9% at 150 mL/hour.   ALLERGIES: No known drug allergies.   FAMILY HISTORY: Positive for diabetes and heart disease.   SOCIAL HISTORY: The patient is married. No tobacco use. Drinks occasional alcohol, about once per 2 weeks.   REVIEW OF SYSTEMS:  GENERAL: No weight loss. No fever.  HEENT: No blurred vision. No sore throat.  NECK: No neck pain. No dysphasia.  CARDIAC: No chest pain. No palpitations.  PULMONARY: No cough. No shortness of breath.  ABDOMEN: No abdominal pain. He was having diarrhea in recent weeks; however, that improved since stopping Victoza on 10/03/2013.  ENDOCRINE:  No heat or cold intolerance.  GENITOURINARY: No dysuria or hematuria.  EXTREMITIES: No leg swelling.  HEMATOLOGIC: No easy bruisability or recent bleeding.   PHYSICAL EXAMINATION: VITAL SIGNS: Height 70 inches, weight 263 pounds, BMI 37.8. Temp 97.8, pulse 65, respirations 18, blood pressure 145/78, pulse ox 97% on room air.  GENERAL: Obese white male in no acute distress.  HEENT: EOMI. Oropharynx is clear.  NECK: Supple.  CARDIAC: Regular rate and rhythm. No carotid bruit. PULMONARY: Clear to auscultation bilaterally. No wheeze.  ABDOMEN: Diffusely soft, nontender, nondistended.  EXTREMITIES: No peripheral edema is present. Full range of motion in all extremities.  NEUROLOGIC: No tremor of outstretched hands. No dysarthria. Alert and oriented x 3. PSYCHIATRIC: Calm and cooperative.    LABORATORY DATA:  Glucose 177, creatinine 1.38, sodium 135, potassium 4.5, bicarb 27, chloride 106. EGFR 48. Creatinine kinase 6962. AST 277, ALT 98. Hematocrit 42%, WBC 7.9, platelets 128. C. difficile stool study negative.   ASSESSMENT: An 81 year old male admitted with rhabdomyolysis after a prolonged period of immobility due to a fall. Also with diabetes, which has been uncontrolled since recently changing his outpatient diabetes regimen.   RECOMMENDATIONS: 1.  Recommend  restarting mixed insulin. He was taking Humalog 75/25 mix; however, this is not available here in the hospital. Will instead give NovoLog 70/30 mix and started a dose of 8 units b.i.d. and consider titrating this up if needed. If he tolerates a regular diet and needs additional medication, I can consider adding back the glipizide tomorrow. On discharge, I expect him to continue with the Humalog 75/25 mix, in addition to glipizide. Again, the Lantus and NovoLog insulin will be stopped. He can be continued on his NovoLog insulin sliding scale.   I will follow along with you. Thank you for the kind request for consultation.   ____________________________ A. Lavone Orn, MD ams:dmm D: 11/01/2013 21:18:41 ET T: 11/01/2013 22:08:48 ET JOB#: [id number]  cc: A. Lavone Orn, MD, <Dictator> Sherlon Handing MD ELECTRONICALLY SIGNED 11/02/2013 8:29

## 2015-01-20 NOTE — Consult Note (Signed)
Chief Complaint and History:  Referring Physician Dr. Dan HumphreysWalker   Chief Complaint Diabetes   Allergies:  No Known Allergies:   Assessment/Plan:  Assessment/Plan 81 yo M well known to me with type 2 diabetes -- he was admitted 2/1 for rhabdo and acute renal failure after a fall at home with weakness and remained on the floor for about 4 hours. Reviewed his diabetes regimen. Sugars are now in the 170-240 range. Pt was examined and interviewed.  A/ Uncontrolled diabetes Rhabdo and acute renal failure  P/ Start insulin 70/30 mix with 8 units bid. Plan to DC out pt regimen of lantus + NovoLog and resume use of mix insulin.  Full consult to follow   Electronic Signatures: Raj JanusSolum, Anna M (MD)  (Signed 03-Feb-15 17:40)  Authored: Chief Complaint and History, ALLERGIES, Assessment/Plan   Last Updated: 03-Feb-15 17:40 by Raj JanusSolum, Anna M (MD)

## 2015-01-28 NOTE — H&P (Signed)
PATIENT NAME:  Cory [name], Cory [name] MR#:  [id number] DATE OF BIRTH:  December 21, 1930  DATE OF ADMISSION:  11/08/2014  PRIMARY CARE PROVIDER: Dr. Dan HumphreysWalker.   EMERGENCY DEPARTMENT REFERRING PHYSICIAN: Sheran FavaMark R. Fanny BienQuale, MD   CHIEF COMPLAINT: Weakness.   HISTORY OF PRESENT ILLNESS: The patient is an 81 year old with history of diabetes, hypertension, hyperlipidemia, chronic kidney disease, morbid obesity, who was in his usual state of health yesterday. However, last night, he developed nausea, vomiting, and diarrhea, and had multiple bouts of the diarrhea and throwing up. Earlier today, he was trying to walk and slipped onto the floor, and his wife had a hard time getting him up. The patient had difficulty with getting up; therefore, he was brought in by EMS. The patient feels very weak, but denies any chest pain or shortness of breath. He did have some chills yesterday, but no fever. He denies any abdominal pain.   PAST MEDICAL HISTORY: Significant for diabetes type 2, hypertension, hyperlipidemia, chronic kidney disease with baseline creatinine 1.6, morbid obesity.   PAST SURGICAL HISTORY: Status post right cataract surgery.   ALLERGIES: None.   MEDICATIONS: Multivitamin 1 tablet p.o. daily, Lantus 28 units at bedtime, Humalog 3 times a day, sliding scale, fish oil 1000 mg daily, coenzyme-Q10 200 one tablet p.o. daily, aspirin 81 one tablet p.o. daily.   SOCIAL HISTORY: No history of smoking, drinking alcohol, or illicit drug use. Lives with his wife.   FAMILY HISTORY: Positive for diabetes and hypertension.   REVIEW OF SYSTEMS:  CONSTITUTIONAL: Complains of fatigue, weakness. No weight loss, no weight gain.  EYES: No blurred or double vision.  ENT: No tinnitus. No ear pain. No hearing loss. No seasonal or year-round allergies.  RESPIRATORY: Denies any cough, wheezing, hemoptysis. No COPD.  CARDIOVASCULAR: Denies any chest pain, orthopnea, or edema.  GASTROINTESTINAL: No nausea, vomiting, diarrhea. No  abdominal pain. No hematemesis. No guarding. No IBS. No jaundice.  GENITOURINARY: Denies any dysuria, hematuria, renal colic, or frequency.  ENDOCRINE: Denies any polyuria, nocturia, or thyroid problems.  HEMATOLOGIC AND LYMPHATIC: Denies anemia, easy bruisability, or bleeding.  SKIN: No acne. No rash.  MUSCULOSKELETAL: Denies any pain in the neck, back, or shoulder.  NEUROLOGIC: No CVA, TIA, or seizures.  PSYCHIATRIC: No anxiety, insomnia.   PHYSICAL EXAMINATION: VITAL SIGNS: Temperature 98.5, pulse 96, respirations 16, blood pressure 133/70.  GENERAL: The patient is a well-developed, obese male in no acute distress.  HEENT: Head atraumatic, normocephalic. Pupils equally round, reactive to light and accommodation. There is no conjunctival pallor, no scleral icterus. Nasal exam shows no drainage or ulceration.  OROPHARYNX: Clear without any exudate.  NECK: Supple without any thyromegaly.  CARDIOVASCULAR: Regular rate and rhythm. No murmurs, rubs, clicks, or gallops.  LUNGS: Clear to auscultation bilaterally without any rales, rhonchi, wheezing.  ABDOMEN: Soft, nontender, nondistended. Positive bowel sounds x 4. No hepatosplenomegaly.  EXTREMITIES: No clubbing, cyanosis, edema.  SKIN: No rash.  LYMPH NODES: Nonpalpable.  NEUROLOGICAL: Cranial nerves II through XII grossly intact. No focal deficits.  PSYCHIATRIC: Not anxious or depressed.   LABORATORY DATA: Glucose 191 BUN 18, creatinine 1.58, sodium 134, potassium 5.0, chloride 101, CO2 of 25, calcium 8.4. Troponin less than 0.02. LFTs: Total protein 7.0, albumin 3.2, bilirubin total 0.5, alkaline phosphatase 65, AST 41, ALT 34. WBC 9.2, hemoglobin 13.9, platelet count was 160,000.   ASSESSMENT AND PLAN: The patient is an 81 year old white male who was sick with nausea, vomiting, and diarrhea. This morning, difficulty with ambulation.  1. Generalized weakness,  likely due to dehydration. At this time, we will give him IV fluids, PT  evaluation, and treatment.  2. Diabetes. The patient will be placed on sliding scale, continued on his Lantus.  3. Chronic renal failure. Appears to be stable.  4. Hypertension. We will continue quinapril as taking at home.  5. Miscellaneous. The patient will be on Lovenox for deep vein thrombosis prophylaxis.   TIME SPENT: 45 minutes.    ____________________________ Lacie Scotts Allena Katz, MD shp:mw D: 11/08/2014 17:57:17 ET T: 11/08/2014 18:31:19 ET JOB#: [id number]  cc: Camilla Skeen H. Allena Katz, MD, <Dictator> Charise Carwin MD ELECTRONICALLY SIGNED 11/17/2014 15:53

## 2015-01-28 NOTE — Discharge Summary (Signed)
PATIENT NAME:  Cory [name], Cory [name] MR#:  [id number] DATE OF BIRTH:  13-Oct-1930  DATE OF ADMISSION:  11/08/2014 DATE OF DISCHARGE:  11/09/2014  FINAL DIAGNOSES:  1. Dehydration.  2. Viral gastroenteritis likely as source of number 1.   3. Chronic kidney disease stage III.  4. Adult onset diabetes mellitus, uncontrolled.  5. Hypertension.  6. Hyperlipidemia.  7. Morbid obesity.  8. Osteoarthritis.   HISTORY AND PHYSICAL: Please see dictated admission history and physical.   HOSPITAL COURSE: The patient was admitted after having episodes of nausea, vomiting, diarrhea, although did not have any significant trouble with diarrhea over the course of his hospitalization, nausea and vomiting resolved over the stay as well. Oral intake was noted to be improved. Physical therapy ambulated the patient, and he required a walker. He required rest breaks due to fatigue, although distance-wise he was able to do reasonably well. His wife relates that he has had worsening overall ambulation for some time, and had done well with physical therapy previously but stopped doing the exercises they had shown him as soon as they signed off.   At this time he will be discharged home in stable condition with his physical activity to be up with a walker as tolerated. He should follow a 2 gram sodium, 1800 calorie ADA diet. He should check his sugar 3 times a day and record this. We will anticipate him following up with Dr. Yates DecampJohn Walker in 1-2 weeks. Home health physical therapy and nursing was arranged for the patient to improve ambulation and endurance as well as to monitor blood pressure, glucose, and oral intake.   DISCHARGE MEDICATIONS:  1. Aspirin 81 mg p.o. daily.  2. Simvastatin 40 mg p.o. daily.  3. Coenzyme Q10, 200 mg p.o. daily.  4. Humalog by sliding scale 3 times a day.  5. Multivitamin 1 p.o. daily.  6. Quinapril 20 mg p.o. daily.  7. Tamsulosin 0.4 mg p.o. at bedtime.  8. Lantus 28 units subcutaneous at  bedtime.  9. Ondansetron 4 mg oral disintegrating strips 3 times a day as needed for nausea or vomiting.   He was instructed to hold zinc and fish oil at this time as he has been having some trouble with loose stool chronically intermittently.    ____________________________ Lynnea FerrierBert J. Klein III, MD bjk:bu D: 11/09/2014 13:01:13 ET T: 11/09/2014 17:09:18 ET JOB#: [id number]  cc: Lynnea FerrierBert J. Klein III, MD, <Dictator> John B. Danne HarborWalker III, MD Daniel NonesBERT KLEIN MD ELECTRONICALLY SIGNED 11/13/2014 13:22

## 2015-09-03 ENCOUNTER — Encounter: Payer: Self-pay | Admitting: *Deleted

## 2015-09-03 ENCOUNTER — Emergency Department
Admission: EM | Admit: 2015-09-03 | Discharge: 2015-09-03 | Disposition: A | Discharge status: Home / Self Care | Payer: Medicare Other | Attending: Emergency Medicine | Admitting: Emergency Medicine

## 2015-09-03 DIAGNOSIS — N189 Chronic kidney disease, unspecified: Secondary | ICD-10-CM | POA: Diagnosis not present

## 2015-09-03 DIAGNOSIS — R1084 Generalized abdominal pain: Secondary | ICD-10-CM | POA: Diagnosis not present

## 2015-09-03 DIAGNOSIS — E1165 Type 2 diabetes mellitus with hyperglycemia: Secondary | ICD-10-CM | POA: Diagnosis not present

## 2015-09-03 DIAGNOSIS — R197 Diarrhea, unspecified: Secondary | ICD-10-CM | POA: Insufficient documentation

## 2015-09-03 DIAGNOSIS — R112 Nausea with vomiting, unspecified: Secondary | ICD-10-CM | POA: Insufficient documentation

## 2015-09-03 DIAGNOSIS — E871 Hypo-osmolality and hyponatremia: Secondary | ICD-10-CM | POA: Insufficient documentation

## 2015-09-03 DIAGNOSIS — R739 Hyperglycemia, unspecified: Secondary | ICD-10-CM

## 2015-09-03 DIAGNOSIS — N289 Disorder of kidney and ureter, unspecified: Secondary | ICD-10-CM

## 2015-09-03 HISTORY — DX: Type 2 diabetes mellitus without complications: E11.9

## 2015-09-03 LAB — COMPREHENSIVE METABOLIC PANEL
ALBUMIN: 4.2 g/dL (ref 3.5–5.0)
ALT: 35 U/L (ref 17–63)
ANION GAP: 10 (ref 5–15)
AST: 43 U/L — ABNORMAL HIGH (ref 15–41)
Alkaline Phosphatase: 50 U/L (ref 38–126)
BUN: 27 mg/dL — ABNORMAL HIGH (ref 6–20)
CO2: 22 mmol/L (ref 22–32)
Calcium: 8.8 mg/dL — ABNORMAL LOW (ref 8.9–10.3)
Chloride: 101 mmol/L (ref 101–111)
Creatinine, Ser: 1.55 mg/dL — ABNORMAL HIGH (ref 0.61–1.24)
GFR calc Af Amer: 46 mL/min — ABNORMAL LOW (ref >60)
GFR calc non Af Amer: 40 mL/min — ABNORMAL LOW (ref >60)
GLUCOSE: 265 mg/dL — AB (ref 65–99)
POTASSIUM: 5.6 mmol/L — AB (ref 3.5–5.1)
SODIUM: 133 mmol/L — AB (ref 135–145)
Total Bilirubin: 0.8 mg/dL (ref 0.3–1.2)
Total Protein: 7.4 g/dL (ref 6.5–8.1)

## 2015-09-03 LAB — CBC WITH DIFFERENTIAL/PLATELET
BASOS ABS: 0 10*3/uL (ref 0–0.1)
Basophils Relative: 0 %
Eosinophils Absolute: 0.2 10*3/uL (ref 0–0.7)
Eosinophils Relative: 2 %
HEMATOCRIT: 48 % (ref 40.0–52.0)
Hemoglobin: 16.2 g/dL (ref 13.0–18.0)
LYMPHS ABS: 0.3 10*3/uL — AB (ref 1.0–3.6)
LYMPHS PCT: 4 %
MCH: 31.1 pg (ref 26.0–34.0)
MCHC: 33.7 g/dL (ref 32.0–36.0)
MCV: 92.3 fL (ref 80.0–100.0)
MONO ABS: 0.4 10*3/uL (ref 0.2–1.0)
MONOS PCT: 5 %
NEUTROS ABS: 7.5 10*3/uL — AB (ref 1.4–6.5)
Neutrophils Relative %: 89 %
Platelets: 166 10*3/uL (ref 150–440)
RBC: 5.2 MIL/uL (ref 4.40–5.90)
RDW: 14.4 % (ref 11.5–14.5)
WBC: 8.4 10*3/uL (ref 3.8–10.6)

## 2015-09-03 LAB — BASIC METABOLIC PANEL
Anion gap: 6 (ref 5–15)
BUN: 25 mg/dL — ABNORMAL HIGH (ref 6–20)
CHLORIDE: 106 mmol/L (ref 101–111)
CO2: 22 mmol/L (ref 22–32)
CREATININE: 1.42 mg/dL — AB (ref 0.61–1.24)
Calcium: 7.8 mg/dL — ABNORMAL LOW (ref 8.9–10.3)
GFR calc non Af Amer: 44 mL/min — ABNORMAL LOW (ref >60)
GFR, EST AFRICAN AMERICAN: 52 mL/min — AB (ref >60)
Glucose, Bld: 212 mg/dL — ABNORMAL HIGH (ref 65–99)
Potassium: 5.7 mmol/L — ABNORMAL HIGH (ref 3.5–5.1)
Sodium: 134 mmol/L — ABNORMAL LOW (ref 135–145)

## 2015-09-03 LAB — LIPASE, BLOOD: Lipase: 64 U/L — ABNORMAL HIGH (ref 11–51)

## 2015-09-03 MED ORDER — SODIUM CHLORIDE 0.9 % IV BOLUS (SEPSIS)
1000.0000 mL | Freq: Once | INTRAVENOUS | Status: AC
Start: 2015-09-03 — End: 2015-09-03
  Administered 2015-09-03: 1000 mL via INTRAVENOUS

## 2015-09-03 MED ORDER — LOPERAMIDE HCL 2 MG PO CAPS
4.0000 mg | ORAL_CAPSULE | Freq: Once | ORAL | Status: AC
  Administered 2015-09-03: 4 mg via ORAL
  Filled 2015-09-03: qty 2

## 2015-09-03 MED ORDER — ONDANSETRON HCL 4 MG/2ML IJ SOLN
4.0000 mg | Freq: Once | INTRAMUSCULAR | Status: AC
  Administered 2015-09-03: 4 mg via INTRAVENOUS
  Filled 2015-09-03: qty 2

## 2015-09-03 MED ORDER — TRAMADOL HCL 50 MG PO TABS
25.0000 mg | ORAL_TABLET | Freq: Once | ORAL | Status: AC
  Administered 2015-09-03: 25 mg via ORAL
  Filled 2015-09-03: qty 1

## 2015-09-03 MED ORDER — LOPERAMIDE HCL 2 MG PO CAPS: 4.0000 mg | ORAL_CAPSULE | Freq: Once | ORAL | Status: DC

## 2015-09-03 MED ORDER — SODIUM CHLORIDE 0.9 % IV BOLUS (SEPSIS)
1000.0000 mL | Freq: Once | INTRAVENOUS | Status: AC
  Administered 2015-09-03: 1000 mL via INTRAVENOUS

## 2015-09-03 MED ORDER — ONDANSETRON 4 MG PO TBDP: 4.0000 mg | ORAL_TABLET | Freq: Three times a day (TID) | ORAL | Status: DC | PRN

## 2015-09-03 NOTE — ED Provider Notes (Signed)
[facility] Emergency Department Provider Note  ____________________________________________  Time seen: Approximately 12:35 PM  I have reviewed the triage vital signs and the nursing notes.   HISTORY  Chief Complaint Nausea; Emesis; and Diarrhea    HPI Cory [name] is a 81 y.o. male with a history of DM 2 on insulin presenting with 2 days of nausea vomiting and diarrhea with abdominal pain. Patient states that he ate pork chops and mashed potatoes at [facility] [address] yesterday and approximately 3 hours later developed nonbloody vomiting and nonbloody watery diarrhea. He has had approximate 6 episodes of each. He has had some associated diffuse nonfocal sharp abdominal pains. He has not had any fever, chills, or urinary symptoms. He has not had any chest pain or shortness of breath. He has no known sick contacts and has not traveled outside of the [place].   Past Medical History  Diagnosis Date  . Diabetes mellitus without complication (HCC)     There are no active problems to display for this patient.   History reviewed. No pertinent past surgical history.  Current Outpatient Rx  Name  Route  Sig  Dispense  Refill  . loperamide (IMODIUM) 2 MG capsule   Oral   Take 2 capsules (4 mg total) by mouth once.   15 capsule   0   . ondansetron (ZOFRAN ODT) 4 MG disintegrating tablet   Oral   Take 1 tablet (4 mg total) by mouth every 8 (eight) hours as needed for nausea or vomiting.   20 tablet   0     Allergies Review of patient's allergies indicates no known allergies.  History reviewed. No pertinent family history.  Social History Social History  Substance Use Topics  . Smoking status: Never Smoker   . Smokeless tobacco: None  . Alcohol Use: None    Review of Systems Constitutional: No fever/chills. No lightheadedness or syncope. Eyes: No visual changes. ENT: No sore throat. Cardiovascular: Denies chest pain, palpitations. Respiratory:  Denies shortness of breath.  No cough. Gastrointestinal: Positive generalized abdominal pain.  Positive nausea, positive vomiting.  No diarrhea.  No constipation. Genitourinary: Negative for dysuria. No testicular or scrotal pain. Musculoskeletal: Negative for back pain. Skin: Negative for rash. Neurological: Negative for headaches, focal weakness or numbness.  10-point ROS otherwise negative.  ____________________________________________   PHYSICAL EXAM:  VITAL SIGNS: ED Triage Vitals  Enc Vitals Group     BP 09/03/15 1219 123/66 mmHg     Pulse Rate 09/03/15 1219 108     Resp 09/03/15 1219 18     Temp 09/03/15 1219 98.7 F (37.1 C)     Temp Source 09/03/15 1219 Oral     SpO2 09/03/15 1219 97 %     Weight 09/03/15 1219 290 lb (131.543 kg)     Height 09/03/15 1219  (1.778 m)     Head Cir --      Peak Flow --      Pain Score 09/03/15 1219 3     Pain Loc --      Pain Edu? --      Excl. in GC? --     Constitutional: Alert and oriented. Well appearing and in no acute distress. Answer question appropriately. Eyes: Conjunctivae are normal.  EOMI. Head: Atraumatic. Nose: No congestion/rhinnorhea. Mouth/Throat: Mucous membranes are dry..  Neck: No stridor.  Supple.  No meningismus. Cardiovascular: Normal rate, regular rhythm. No murmurs, rubs or gallops.  Respiratory: Normal respiratory effort.  No retractions.  Lungs CTAB.  No wheezes, rales or ronchi. Gastrointestinal: Abdomen is soft, obese, and minimally distended. There is no reproducible tenderness to palpation. There is no guarding or rebound, no peritoneal signs, and a negative Murphy sign. Musculoskeletal: No LE edema.  Neurologic:  Normal speech and language. No gross focal neurologic deficits are appreciated.  Skin:  Skin is warm, dry and intact. No rash noted. Psychiatric: Mood and affect are normal. Speech and behavior are normal.  Normal judgement  ____________________________________________   LABS (all  labs ordered are listed, but only abnormal results are displayed)  Labs Reviewed  COMPREHENSIVE METABOLIC PANEL - Abnormal; Notable for the following:    Sodium 133 (*)    Potassium 5.6 (*)    Glucose, Bld 265 (*)    BUN 27 (*)    Creatinine, Ser 1.55 (*)    Calcium 8.8 (*)    AST 43 (*)    GFR calc non Af Amer 40 (*)    GFR calc Af Amer 46 (*)    All other components within normal limits  CBC WITH DIFFERENTIAL/PLATELET - Abnormal; Notable for the following:    Neutro Abs 7.5 (*)    Lymphs Abs 0.3 (*)    All other components within normal limits  LIPASE, BLOOD - Abnormal; Notable for the following:    Lipase 64 (*)    All other components within normal limits  BASIC METABOLIC PANEL - Abnormal; Notable for the following:    Sodium 134 (*)    Potassium 5.7 (*)    Glucose, Bld 212 (*)    BUN 25 (*)    Creatinine, Ser 1.42 (*)    Calcium 7.8 (*)    GFR calc non Af Amer 44 (*)    GFR calc Af Amer 52 (*)    All other components within normal limits  URINALYSIS COMPLETEWITH MICROSCOPIC ([facility] ONLY)   ____________________________________________  EKG  ED ECG REPORT I, Rockne MenghiniNorman, Anne-Caroline, the attending physician, personally viewed and interpreted this ECG.   Date: 09/03/2015  EKG Time: 1249  Rate: 106  Rhythm: Poor baseline tracing but it appears the patient has a sinus tachycardia.  Axis: Leftward  Intervals:right bundle branch block  ST&T Change: Nonspecific T-wave inversions in V1. Otherwise no ischemic changes.  EKG is compared to 24/16 where the patient has a similar morphology with long-standing right bundle branch block. No significant changes from prior. ____________________________________________  RADIOLOGY  No results found.  ____________________________________________   PROCEDURES  Procedure(s) performed: None  Critical Care performed: No ____________________________________________   INITIAL IMPRESSION / ASSESSMENT AND PLAN / ED  COURSE  Pertinent labs & imaging results that were available during my care of the patient were reviewed by me and considered in my medical decision making (see chart for details).   81 y.o. male with a history of diabetes presenting with acute onset of nausea vomiting and diarrhea [facility] He has some mild associated diffuse abdominal discomfort and no focality on exam. The most likely etiology is a foodborne illness. A viral GI illness is also possible. There are no findings on exam that are consistent with an acute surgical pathology however I will continue serial abdominal exams after his symptom at a treatment.  ----------------------------------------- 1:23 PM on 09/03/2015 -----------------------------------------  The patient has not had any additional symptoms since his arrival to the hospital. His labs do show a mild bump in his creatinine, which she has had in the past. This is likely due to dehydration  from his nausea and vomiting with diarrhea. He is mildly hyponatremic as well. I will plan to replete his fluids and reevaluate these numbers as I anticipate they will improve. His lipase is 64 and his exam is reassuring and I do not feel this is pancreatitis. He has a normal white blood cell count, no focality in his exam and history that is consistent with a foodborne illness. I do not feel it is in his best interest and it would be risky for his kidneys to do CT imaging at this time.  ----------------------------------------- 1:55 PM on 09/03/2015 -----------------------------------------  ED ECG REPORT I, Rockne Menghini, the attending physician, personally viewed and interpreted this ECG.   Date: 09/03/2015  EKG Time: 1350  Rate: 93  Rhythm: normal sinus rhythm  Axis: Leftward  Intervals:right bundle branch block  ST&T Change: Nonspecific T-wave inversion in V1. There is no peaked ST changes. He has a mildly widened QRS which is typical of his  chronic right bundle branch block.  ----------------------------------------- 2:26 PM on 09/03/2015 -----------------------------------------  The patient continues to feel better. He has tolerated by mouth in the emergency department. I'll plan to repeat his lab work and if he is improving, plan discharge home with close PMD follow-up.  ----------------------------------------- 3:17 PM on 09/03/2015 -----------------------------------------  The patient continues to feel well and has not had any episodes of vomiting or diarrhea here. His repeat labs show a stable potassium but this set of labs was hemolyzed so it is likely lower. He has no hyperkalemic changes on his EKG. His creatinine is improving with IV fluids. I've given him strict instructions about aggressive fluid hydration at home and follow-up with Dr. Dan Humphreys tomorrow for repeat testing and evaluation.  He understands return precautions as well as follow-up instructions.  ____________________________________________  FINAL CLINICAL IMPRESSION(S) / ED DIAGNOSES  Final diagnoses:  Hyperglycemia  Hyponatremia  Acute on chronic renal insufficiency (HCC)  Nausea vomiting and diarrhea      NEW MEDICATIONS STARTED DURING THIS VISIT:  New Prescriptions   LOPERAMIDE (IMODIUM) 2 MG CAPSULE    Take 2 capsules (4 mg total) by mouth once.   ONDANSETRON (ZOFRAN ODT) 4 MG DISINTEGRATING TABLET    Take 1 tablet (4 mg total) by mouth every 8 (eight) hours as needed for nausea or vomiting.     Rockne Menghini, MD 09/03/15 [phone]

## 2015-09-03 NOTE — Discharge Instructions (Signed)
Please use Zofran for nausea and vomiting and loperamide for diarrhea. You may take a clear liquid diet for the next 12-24 hours, then advance to a full liquid diet for one to 2 days. Afterwards, you may advance to a BRAT diet as tolerated.  Please make an appointment to follow up with your primary care physician tomorrow. He will need to have your potassium and her kidney function rechecked.  Please return to the emergency department if you develop abdominal pain, fever, inability to keep down fluids, lightheadedness or fainting, or any other symptoms concerning to you.

## 2015-09-03 NOTE — ED Notes (Signed)
Pt arrives via EMS for nausea, vomiting and diaherra, states he ate at [employer] last night and since then felt bad, states the last time he threw up was about 1 hour ago

## 2016-01-23 DIAGNOSIS — E034 Atrophy of thyroid (acquired): Secondary | ICD-10-CM | POA: Insufficient documentation

## 2016-07-29 DIAGNOSIS — G4733 Obstructive sleep apnea (adult) (pediatric): Secondary | ICD-10-CM | POA: Insufficient documentation

## 2016-10-17 ENCOUNTER — Emergency Department: Payer: Medicare Other

## 2016-10-17 ENCOUNTER — Encounter: Payer: Self-pay | Admitting: Urgent Care

## 2016-10-17 ENCOUNTER — Observation Stay
Admission: EM | Admit: 2016-10-17 | Discharge: 2016-10-20 | Disposition: A | Discharge status: Home / Self Care | Payer: Medicare Other | Attending: Internal Medicine | Admitting: Internal Medicine

## 2016-10-17 DIAGNOSIS — Z8249 Family history of ischemic heart disease and other diseases of the circulatory system: Secondary | ICD-10-CM | POA: Insufficient documentation

## 2016-10-17 DIAGNOSIS — E039 Hypothyroidism, unspecified: Secondary | ICD-10-CM | POA: Insufficient documentation

## 2016-10-17 DIAGNOSIS — E1122 Type 2 diabetes mellitus with diabetic chronic kidney disease: Secondary | ICD-10-CM | POA: Diagnosis not present

## 2016-10-17 DIAGNOSIS — N183 Chronic kidney disease, stage 3 (moderate): Secondary | ICD-10-CM | POA: Insufficient documentation

## 2016-10-17 DIAGNOSIS — S76912A Strain of unspecified muscles, fascia and tendons at thigh level, left thigh, initial encounter: Secondary | ICD-10-CM | POA: Diagnosis not present

## 2016-10-17 DIAGNOSIS — N400 Enlarged prostate without lower urinary tract symptoms: Secondary | ICD-10-CM | POA: Insufficient documentation

## 2016-10-17 DIAGNOSIS — Z794 Long term (current) use of insulin: Secondary | ICD-10-CM | POA: Diagnosis not present

## 2016-10-17 DIAGNOSIS — E785 Hyperlipidemia, unspecified: Secondary | ICD-10-CM | POA: Diagnosis not present

## 2016-10-17 DIAGNOSIS — G4733 Obstructive sleep apnea (adult) (pediatric): Secondary | ICD-10-CM | POA: Diagnosis not present

## 2016-10-17 DIAGNOSIS — M1611 Unilateral primary osteoarthritis, right hip: Secondary | ICD-10-CM | POA: Insufficient documentation

## 2016-10-17 DIAGNOSIS — R2681 Unsteadiness on feet: Secondary | ICD-10-CM

## 2016-10-17 DIAGNOSIS — Z79899 Other long term (current) drug therapy: Secondary | ICD-10-CM | POA: Diagnosis not present

## 2016-10-17 DIAGNOSIS — W000XXA Fall on same level due to ice and snow, initial encounter: Secondary | ICD-10-CM | POA: Diagnosis not present

## 2016-10-17 DIAGNOSIS — R52 Pain, unspecified: Secondary | ICD-10-CM | POA: Diagnosis present

## 2016-10-17 DIAGNOSIS — Y93K1 Activity, walking an animal: Secondary | ICD-10-CM | POA: Insufficient documentation

## 2016-10-17 DIAGNOSIS — M79605 Pain in left leg: Secondary | ICD-10-CM

## 2016-10-17 DIAGNOSIS — Z87891 Personal history of nicotine dependence: Secondary | ICD-10-CM | POA: Diagnosis not present

## 2016-10-17 LAB — BASIC METABOLIC PANEL
ANION GAP: 9 (ref 5–15)
BUN: 25 mg/dL — ABNORMAL HIGH (ref 6–20)
CHLORIDE: 102 mmol/L (ref 101–111)
CO2: 23 mmol/L (ref 22–32)
Calcium: 9.2 mg/dL (ref 8.9–10.3)
Creatinine, Ser: 1.6 mg/dL — ABNORMAL HIGH (ref 0.61–1.24)
GFR calc non Af Amer: 38 mL/min — ABNORMAL LOW (ref >60)
GFR, EST AFRICAN AMERICAN: 44 mL/min — AB (ref >60)
Glucose, Bld: 177 mg/dL — ABNORMAL HIGH (ref 65–99)
POTASSIUM: 4.2 mmol/L (ref 3.5–5.1)
SODIUM: 134 mmol/L — AB (ref 135–145)

## 2016-10-17 IMAGING — CR DG FEMUR 2+V*L*
1 series · 5 of 5 positions shown · non-contrast
Comparison: None.

CLINICAL DATA: Distal left femoral pain after slip and fall on ice

EXAM:
LEFT FEMUR 2 VIEWS

[Series 1: dg femur min 2 views left · 0.14mm/px · 5 of 5 slices shown]
[im 1/5]
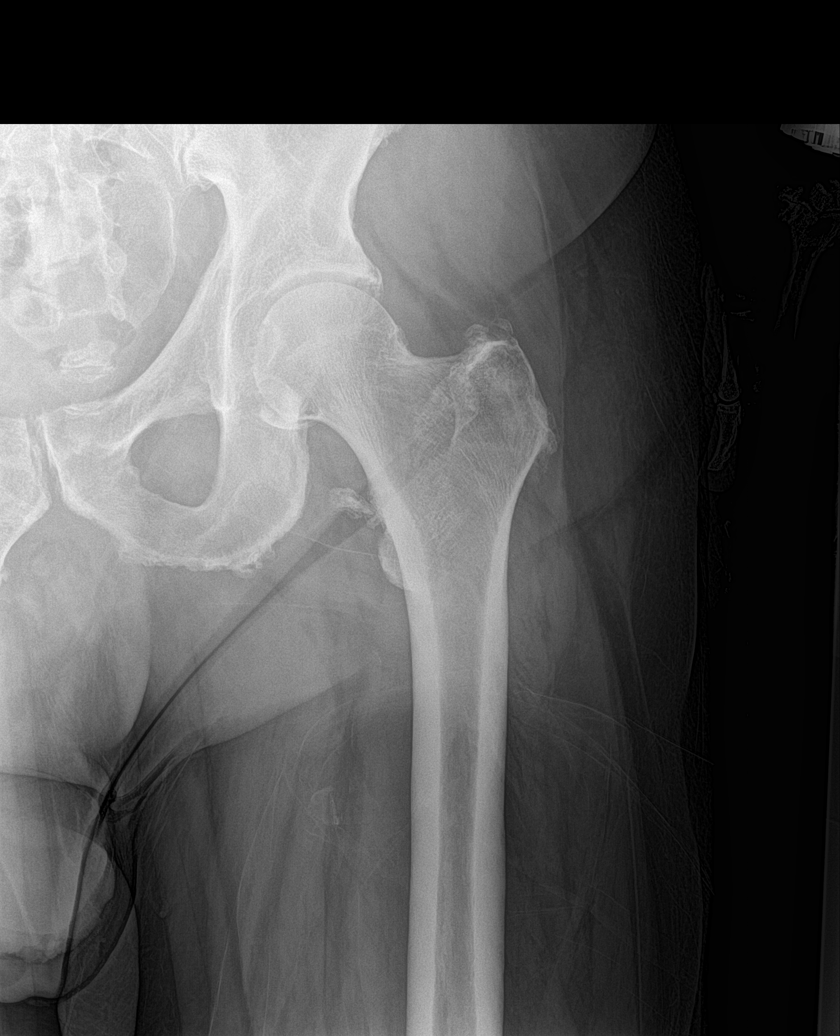
[im 2/5]
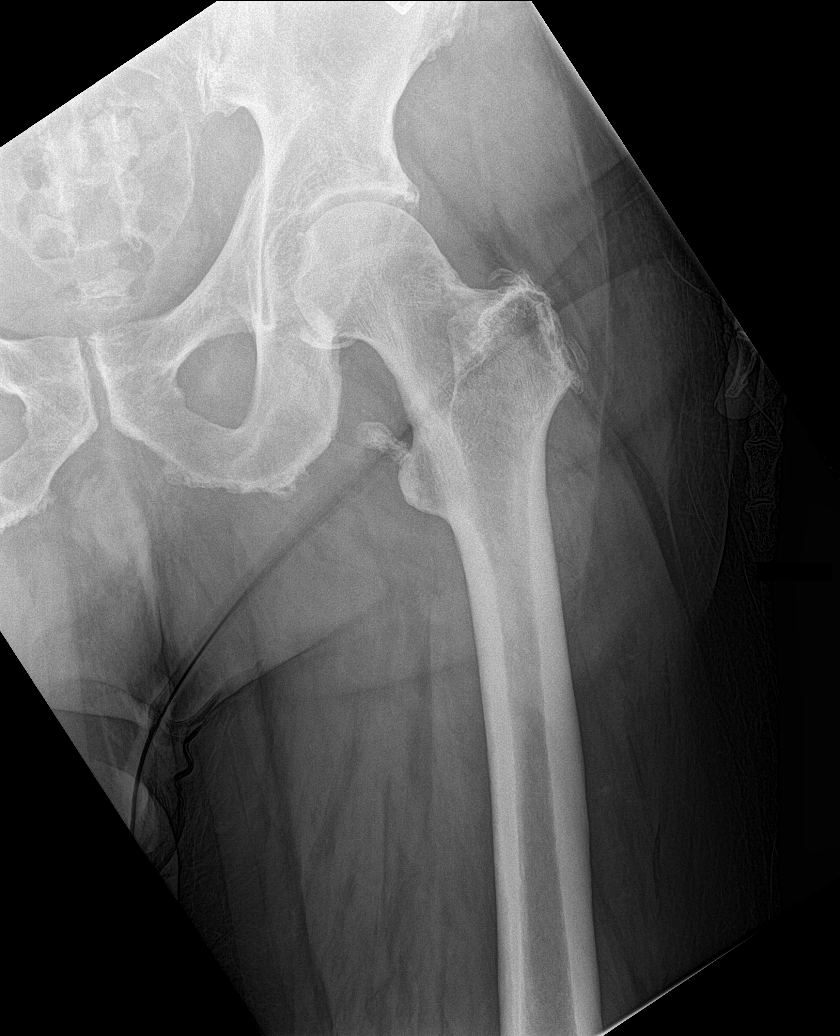
[im 3/5]
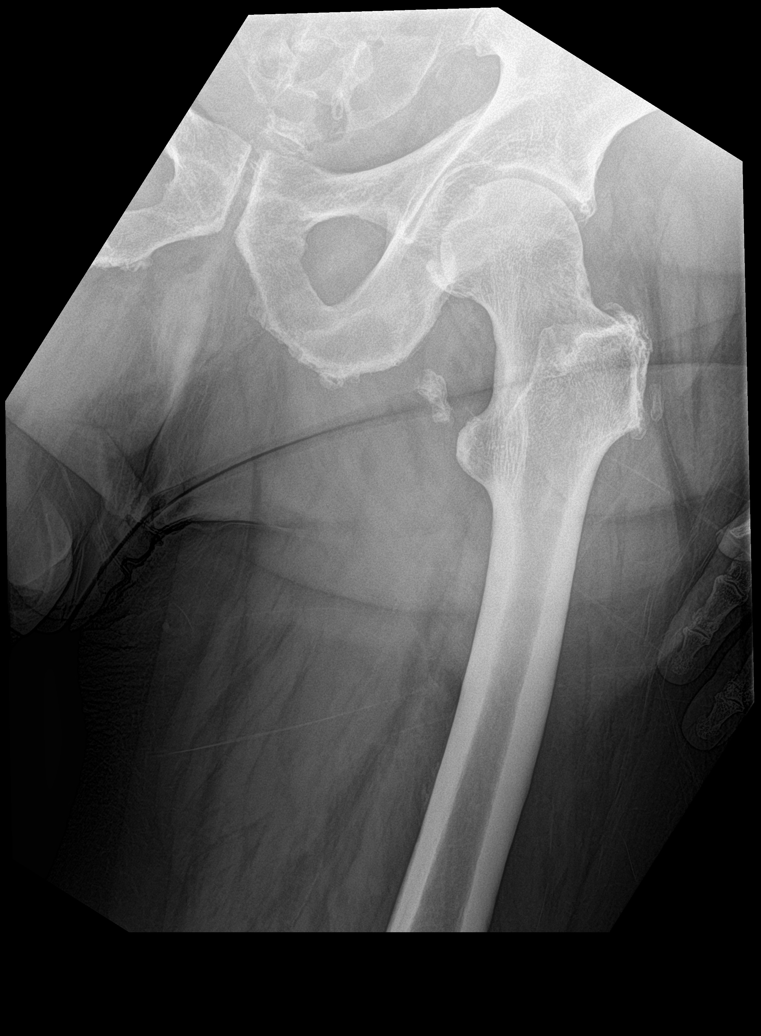
[im 4/5]
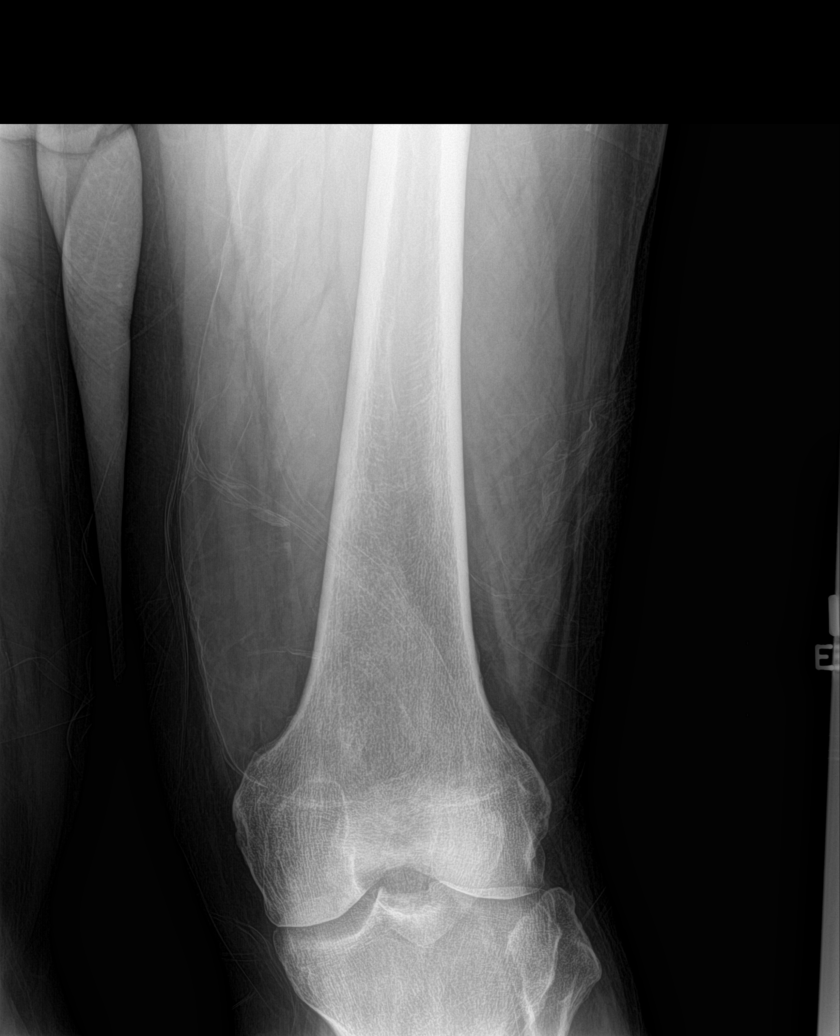
[im 5/5]
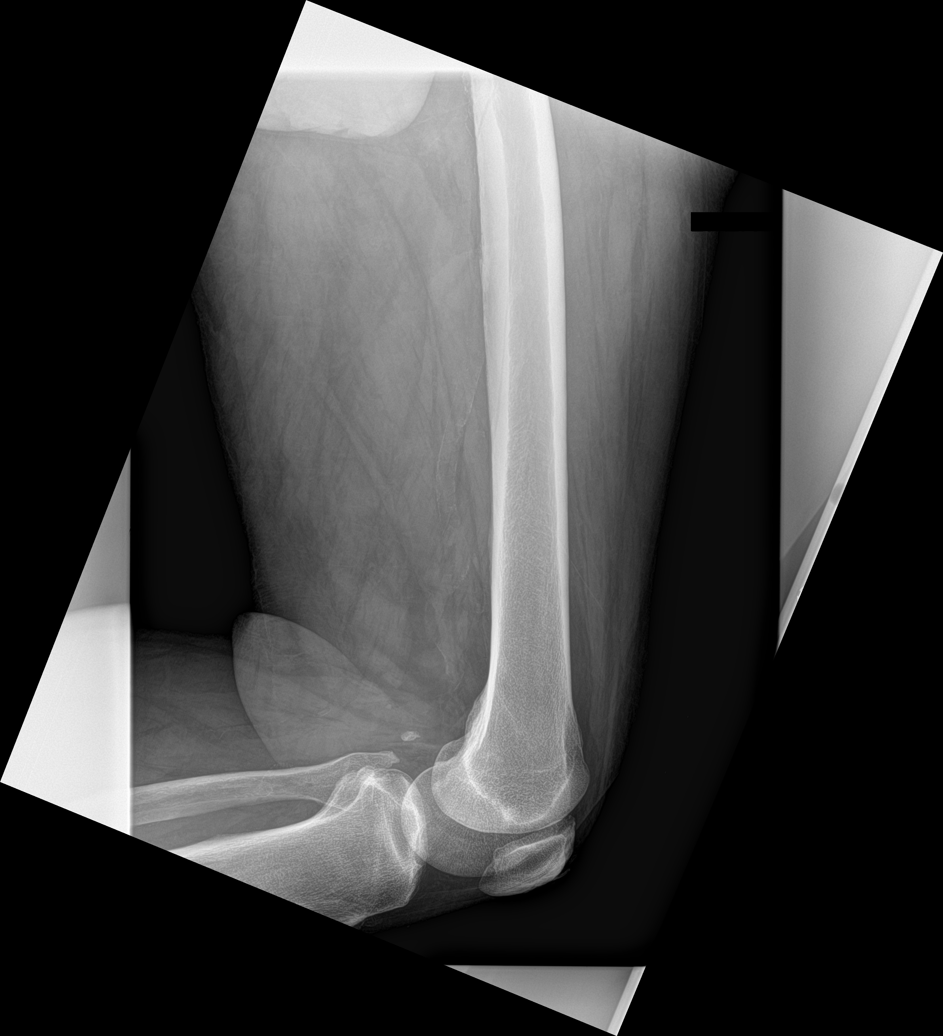

[5 of 5 positions shown; findings below may reference images not displayed]

FINDINGS: There is no evidence of acute left femoral fracture. The hip and
knee joints are maintained. No joint effusion. Calcific tendinopathy
along the course of the left iliopsoas. Enthesophytes are noted off
the left greater trochanter and ischial tuberosity. The pubic rami
are intact. Soft tissues are unremarkable.
IMPRESSION: No acute fracture.  Iliopsoas calcific tendinopathy.

## 2016-10-17 MED ORDER — OXYCODONE-ACETAMINOPHEN 5-325 MG PO TABS: 1.0000 | ORAL_TABLET | Freq: Four times a day (QID) | ORAL | 0 refills | Status: DC | PRN

## 2016-10-17 MED ORDER — MORPHINE SULFATE (PF) 4 MG/ML IV SOLN
4.0000 mg | Freq: Once | INTRAVENOUS | Status: AC
  Administered 2016-10-17: 4 mg via INTRAVENOUS

## 2016-10-17 MED ORDER — ONDANSETRON HCL 4 MG/2ML IJ SOLN
INTRAMUSCULAR | Status: AC
  Administered 2016-10-17: 4 mg via INTRAVENOUS
  Filled 2016-10-17: qty 2

## 2016-10-17 MED ORDER — MORPHINE SULFATE (PF) 4 MG/ML IV SOLN
INTRAVENOUS | Status: AC
  Administered 2016-10-17: 4 mg via INTRAVENOUS
  Filled 2016-10-17: qty 1

## 2016-10-17 MED ORDER — ONDANSETRON HCL 4 MG/2ML IJ SOLN
4.0000 mg | Freq: Once | INTRAMUSCULAR | Status: AC
  Administered 2016-10-17: 4 mg via INTRAVENOUS

## 2016-10-17 MED ORDER — SODIUM CHLORIDE 0.9 % IV SOLN
Freq: Once | INTRAVENOUS | Status: AC
  Administered 2016-10-17: 23:00:00 via INTRAVENOUS

## 2016-10-17 NOTE — ED Notes (Signed)
Dr. Darnelle CatalanMalinda to bedside to discuss POC with this patient and his spouse.

## 2016-10-17 NOTE — ED Notes (Signed)
MD notified this RN that patient's results from the performed plain films of his femur were negative. MD wanting patient to be gotten OOB and ambulated using a walker for assistance. RN to bedside to make patient aware. Patient states, "I cant do that. There is a muscle or tendon or something in there that is torn. I dont know about me walking". Of note, patient is recently s/p two doses of Morphine, with the last dose being given less than 15 minutes ago. Will give patient some additional time before attempting ambulation trial.

## 2016-10-17 NOTE — ED Notes (Signed)
PO fluids provided at this time per patient request; 240cc water consumed.

## 2016-10-17 NOTE — ED Notes (Signed)
Patient back from radiology. Patient states, "They moved me around so much over there. They had me moving in ways I told them hurt. They undid everything that you have done. My pain is back to an 8 now". MD made aware. RN reviewed BMP results with MD. VSS; current BO 128/87. MD with [facility] for Morphine 4mg  IVP; [facility] and carried by emergency department nursing staff.

## 2016-10-17 NOTE — Discharge Instructions (Signed)
Try to keep the leg up as much as possible. Use a walker to get around when you need to. Take the Percocet 1 pill 4 times a day as needed for pain. Remember it can make you constipated and woozy. Be careful not to fall. Do not drive on the Percocet. Use ice tonight on the area and heat tomorrow. He can put heat 20 minutes every hour or so. Do not fall asleep with the use of ice or the heat on his you can get burns. I would not apply either for more than 20 minutes at a time. Return for worse pain swelling or bruising or any other problems. Follow-up with the orthopedic surgeon. Call his office Monday morning to tell him your in the ER and have severe pain behind your leg after fall. He should be able to arrange to see you in the week.

## 2016-10-17 NOTE — ED Notes (Signed)
Pain reassessed; currently 3/10. Patient remains CAO x 4; very talkative. Will continue to monitor.

## 2016-10-17 NOTE — ED Triage Notes (Signed)
Patient presents to the ED via EMS from home. Patient with c/o pain in his hamstring area. He reports that he was walking the dog and slipped on the ice, which caused him to fall. Patient reports inability to bear weight. EMS placed IV and gave Toradol 60mg  IVP. (+) PMS noted distal to injury.

## 2016-10-17 NOTE — H&P (Signed)
History and Physical   [facility] [place] @ [facility] Admission History and Physical [employer], D.O.    Patient Name: Cory [name] MR#: [id number] Date of Birth: 12/03/1930 Date of Admission: 10/17/2016  Referring MD/NP/PA: Dr. Darnelle Catalan Primary Care Physician: Rafael Bihari, MD Outpatient Specialists: Dr. Tedd Sias  Patient coming from: Home  Chief Complaint: Leg pain s/p fall  HPI: Cory [name] is a 83 y.o. male with a known history of diabetes, hyperlipidemia, hypothyroidism, osteoarthritis the right hip, CK D stage III and obstructive sleep apnea was in a usual state of health until this afternoon when he sustained a mechanical fall. Patient states that his hat fell of his head and he went to try to catch it and he fell forward landing on his hands and knees. He experienced immediate excruciating pain behind the left leg about 6 inches above the posterior knee.Marland Kitchen He reports that he was able to crawl to his arrival he called for his wife. He was unable to stand or walk and was transported to the emergency department via ambulance. Patient denies any preceding symptoms as dizziness, lightheadedness, chest pain, shortness of breath. Patient denies fevers/chills, weakness, dizziness, chest pain, shortness of breath, N/V/C/D, abdominal pain, dysuria/frequency, changes in mental status.    Otherwise there has been no change in status. Patient has been taking medication as prescribed and there has been no recent change in medication or diet.  No recent antibiotics.  There has been no recent illness, hospitalizations, travel or sick contacts.    In the emergency department the patient received 8 mg of morphine and 1 tab of Roxicet 5/325 as well as Zofran and normal saline. When attempting to mobilize patient continued to suffer from severe pain and was unable to ambulate therefore hospitalists were contacted requesting admission for pain control.   Review of Systems:  CONSTITUTIONAL:  No fever/chills, fatigue, weakness, weight gain/loss, headache. EYES: No blurry or double vision. ENT: No tinnitus, postnasal drip, redness or soreness of the oropharynx. RESPIRATORY: No cough, dyspnea, wheeze.  No hemoptysis.  CARDIOVASCULAR: No chest pain, palpitations, syncope, orthopnea. No lower extremity edema.  GASTROINTESTINAL: No nausea, vomiting, abdominal pain, diarrhea, constipation.  No hematemesis, melena or hematochezia. GENITOURINARY: No dysuria, frequency, hematuria. ENDOCRINE: No polyuria or nocturia. No heat or cold intolerance. HEMATOLOGY: No anemia, bruising, bleeding. INTEGUMENTARY: No rashes, ulcers, lesions. MUSCULOSKELETAL: No arthritis, gout, dyspnea. Positive posterior left leg pain NEUROLOGIC: No numbness, tingling, ataxia, seizure-type activity, weakness. PSYCHIATRIC: No anxiety, depression, insomnia.   Past Medical History:  Diagnosis Date  . Diabetes mellitus without complication (HCC)     History reviewed. No pertinent surgical history.  Patient smoked pipes and cigars for 15 years and quit in 1975 and drinks about one drink per month.  No Known Allergies  Father type 2 diabetes, heart disease, died at age 39 Sr. with heart disease died at age 52 of an MI Family history has been reviewed and confirmed with patient.   Prior to Admission medications   Medication Sig Start Date End Date Taking? Authorizing Provider  co-enzyme Q-10 30 MG capsule Take 30 mg by mouth daily.   Yes Historical Provider, MD  HUMALOG KWIKPEN 100 UNIT/ML KiwkPen Inject 25-30 Units into the skin 2 (two) times daily. 25 units with lunch and 30 units at supper. 08/18/16  Yes Historical Provider, MD  LANTUS SOLOSTAR 100 UNIT/ML Solostar Pen Inject 45 Units into the skin daily at 10 pm.  08/18/16  Yes Historical Provider, MD  pyridoxine (  B-6) 100 MG tablet Take 100 mg by mouth daily.   Yes Historical Provider, MD  quinapril (ACCUPRIL) 20 MG tablet Take 1 tablet by mouth daily.  09/21/16  Yes Historical Provider, MD  simvastatin (ZOCOR) 40 MG tablet Take 1 tablet by mouth daily. 09/26/16  Yes Historical Provider, MD  SYNTHROID 50 MCG tablet Take 1 tablet by mouth daily. 09/26/16  Yes Historical Provider, MD  tamsulosin (FLOMAX) 0.4 MG CAPS capsule Take 1 capsule by mouth daily. 07/29/16  Yes Historical Provider, MD  traMADol (ULTRAM) 50 MG tablet Take 50 mg by mouth every 6 (six) hours as needed.   Yes Historical Provider, MD  loperamide (IMODIUM) 2 MG capsule Take 2 capsules (4 mg total) by mouth once. Patient not taking: Reported on 10/17/2016 09/03/15   Rockne Menghini, MD  ondansetron (ZOFRAN ODT) 4 MG disintegrating tablet Take 1 tablet (4 mg total) by mouth every 8 (eight) hours as needed for nausea or vomiting. Patient not taking: Reported on 10/17/2016 09/03/15   Rockne Menghini, MD  oxyCODONE-acetaminophen (ROXICET) 5-325 MG tablet Take 1 tablet by mouth every 6 (six) hours as needed. 10/17/16 10/17/17  Arnaldo Natal, MD    Physical Exam: Vitals:   10/17/16 2013 10/17/16 2030 10/17/16 2100 10/17/16 2130  BP: 128/87 140/67 137/63 131/66  Pulse:  67 63 67  Resp:      Temp:      TempSrc:      SpO2:  93% 97% 98%  Weight:      Height:        GENERAL: 83 y.o.-year-old Obese white male patient, well-developed, well-nourished lying in the bed in no acute distress.  Pleasant and cooperative.   HEENT: Head atraumatic, normocephalic. Pupils equal, round, reactive to light and accommodation. No scleral icterus. Extraocular muscles intact. Nares are patent. Oropharynx is clear. Mucus membranes moist. NECK: Supple, full range of motion. No JVD, no bruit heard. No thyroid enlargement, no tenderness, no cervical lymphadenopathy. CHEST: Normal breath sounds bilaterally. No wheezing, rales, rhonchi or crackles. No use of accessory muscles of respiration.  No reproducible chest wall tenderness.  CARDIOVASCULAR: S1, S2 normal. No murmurs, rubs, or gallops. Cap refill  <2 seconds. Pulses intact distally.  ABDOMEN: Soft, nondistended, nontender. No rebound, guarding, rigidity. Normoactive bowel sounds present in all four quadrants. No organomegaly or mass. EXTREMITIES: There is point tenderness in the mid point of the left hamstring. There is no bony tenderness. Range of motion is limited secondary to pain. There is no hematoma, ecchymosis or bruising. He has full range of motion and 5 out of 5 strength in bilateral lower extremities in all directions. No pedal edema, cyanosis, or clubbing. No calf tenderness or Homan's sign.  NEUROLOGIC: The patient is alert and oriented x 3. Cranial nerves II through XII are grossly intact with no focal sensorimotor deficit. Muscle strength 5/5 in all extremities. Sensation intact. Gait not checked. PSYCHIATRIC:  Normal affect, mood, thought content. SKIN: Warm, dry, and intact without obvious rash, lesion, or ulcer.   Labs on Admission:  CBC: No results for input(s): WBC, NEUTROABS, HGB, HCT, MCV, PLT in the last 168 hours. Basic Metabolic Panel:  Recent Labs Lab 10/17/16 1942  NA 134*  K 4.2  CL 102  CO2 23  GLUCOSE 177*  BUN 25*  CREATININE 1.60*  CALCIUM 9.2   GFR: Estimated Creatinine Clearance: 44.5 mL/min (by C-G formula based on SCr of 1.6 mg/dL (H)). Liver Function Tests: No results for input(s): AST, ALT, ALKPHOS, BILITOT,  PROT, ALBUMIN in the last 168 hours. No results for input(s): LIPASE, AMYLASE in the last 168 hours. No results for input(s): AMMONIA in the last 168 hours. Coagulation Profile: No results for input(s): INR, PROTIME in the last 168 hours. Cardiac Enzymes: No results for input(s): CKTOTAL, CKMB, CKMBINDEX, TROPONINI in the last 168 hours. BNP (last 3 results) No results for input(s): PROBNP in the last 8760 hours. HbA1C: No results for input(s): HGBA1C in the last 72 hours. CBG: No results for input(s): GLUCAP in the last 168 hours. Lipid Profile: No results for input(s): CHOL,  HDL, LDLCALC, TRIG, CHOLHDL, LDLDIRECT in the last 72 hours. Thyroid Function Tests: No results for input(s): TSH, T4TOTAL, FREET4, T3FREE, THYROIDAB in the last 72 hours. Anemia Panel: No results for input(s): VITAMINB12, FOLATE, FERRITIN, TIBC, IRON, RETICCTPCT in the last 72 hours. Urine analysis:    Component Value Date/Time   COLORURINE YELLOW 11/06/2013 1847   APPEARANCEUR Clear 11/06/2013 1847   LABSPEC 1.018 11/06/2013 1847   PHURINE 5.0 11/06/2013 1847   GLUCOSEU >=500 11/06/2013 1847   HGBUR Negative 11/06/2013 1847   BILIRUBINUR Negative 11/06/2013 1847   KETONESUR Negative 11/06/2013 1847   PROTEINUR Negative 11/06/2013 1847   NITRITE Negative 11/06/2013 1847   LEUKOCYTESUR Negative 11/06/2013 1847   Sepsis Labs: @LABRCNTIP (procalcitonin:4,lacticidven:4) )No results found for this or any previous visit (from the past 240 hour(s)).   Radiological Exams on Admission: Dg Femur Min 2 Views Left  Result Date: 10/17/2016 CLINICAL DATA:  Distal left femoral pain after slip and fall on ice EXAM: LEFT FEMUR 2 VIEWS COMPARISON:  None. FINDINGS: There is no evidence of acute left femoral fracture. The hip and knee joints are maintained. No joint effusion. Calcific tendinopathy along the course of the left iliopsoas. Enthesophytes are noted off the left greater trochanter and ischial tuberosity. The pubic rami are intact. Soft tissues are unremarkable. IMPRESSION: No acute fracture.  Iliopsoas calcific tendinopathy. Electronically Signed   By: Tollie Eth M.D.   On: 10/17/2016 20:27    Assessment/Plan Active Problems:   Intractable pain    This is a 83 y.o. male with a history ofdiabetes, hyperlipidemia, hypothyroidism, osteoarthritis the right hip, CK D stage III and obstructive sleep apnea  now being admitted with: 1. Intractable pain left thigh pain s/p mechanical fall.  -There is no evidence of any tenderness or bony injury. However given patient's gait instability and severe  intractable pain we will admit for observation - Pain control - PT eval in AM -Consider MRI if pain not adequately controlled and or if there is evidence of soft tissue injury.  2. Hypotension - Gentle IVFs  3. DM - Continue home meds including Humalog, Lantus, quinapril  4. Hyperlipidemia -Continue Zocor  5. History of hypothyroid -Continue Synthroid  6. History of BPH -Continue Flomax  7. History of osteoarthritis of the hip -Continue Ultram -Recommend discontinuing Aleve given history of chronic kidney disease  8. CK D stage III -Creatinine near baseline -Gentle IV fluid hydration and repeat BMP in a.m.  Admission status: Observation IV Fluids: NS Diet/Nutrition: HH, CC Consults called: PT  DVT Px: Lovenox, SCDs and early ambulation. Code Status: Full Code  Disposition Plan: To home in <24 hours   All the records are reviewed and case discussed with ED provider. Management plans discussed with the patient and/or family who express understanding and agree with plan of care.  Verona Hartshorn D.O. on 10/17/2016 at 10:38 PM Between 7am to 6pm - Pager - [phone]  After 6pm go to [email or web address] - Social research officer, governmentpassword EPAS [facility] [place] [facility] Office [phone] CC: Primary care physician; Rafael BihariWALKER III, JOHN B, MD   10/17/2016, 10:38 PM

## 2016-10-17 NOTE — ED Provider Notes (Addendum)
[facility] Emergency Department Provider Note   ____________________________________________   First MD Initiated Contact with Patient 10/17/16 1943     (approximate)  I have reviewed the triage vital signs and the nursing notes.   HISTORY  Chief Complaint Fall and Leg Pain    HPI Cory [name] is a 83 y.o. male patient walking his dog slipped on the ice and fell. He complains of a lot of pain behind the left leg in the middle of the thigh. Patient reports she was able to make it back into the house but when EMS came to get him he could not walk any longer. He has no pain anteriorly.   Past Medical History:  Diagnosis Date  . Diabetes mellitus without complication (HCC)     There are no active problems to display for this patient.   History reviewed. No pertinent surgical history.  Prior to Admission medications   Medication Sig Start Date End Date Taking? Authorizing Provider  HUMALOG KWIKPEN 100 UNIT/ML KiwkPen  08/18/16   Historical Provider, MD  LANTUS SOLOSTAR 100 UNIT/ML Solostar Pen  08/18/16   Historical Provider, MD  loperamide (IMODIUM) 2 MG capsule Take 2 capsules (4 mg total) by mouth once. 09/03/15   Anne-Caroline Sharma Covert, MD  ondansetron (ZOFRAN ODT) 4 MG disintegrating tablet Take 1 tablet (4 mg total) by mouth every 8 (eight) hours as needed for nausea or vomiting. 09/03/15   Rockne Menghini, MD  oxyCODONE-acetaminophen (ROXICET) 5-325 MG tablet Take 1 tablet by mouth every 6 (six) hours as needed. 10/17/16 10/17/17  Arnaldo Natal, MD  quinapril (ACCUPRIL) 20 MG tablet Take 1 tablet by mouth daily. 09/21/16   Historical Provider, MD  simvastatin (ZOCOR) 40 MG tablet Take 1 tablet by mouth daily. 09/26/16   Historical Provider, MD  SYNTHROID 50 MCG tablet Take 1 tablet by mouth daily. 09/26/16   Historical Provider, MD  tamsulosin (FLOMAX) 0.4 MG CAPS capsule Take 1 capsule by mouth daily. 07/29/16   Historical Provider, MD     Allergies Patient has no known allergies.  No family history on file.  Social History Social History  Substance Use Topics  . Smoking status: Never Smoker  . Smokeless tobacco: Never Used  . Alcohol use Yes    Review of Systems Constitutional: No fever/chills Eyes: No visual changes. ENT: No sore throat. Cardiovascular: Denies chest pain. Respiratory: Denies shortness of breath. Gastrointestinal: No abdominal pain.  No nausea, no vomiting.  No diarrhea.  No constipation. Genitourinary: Negative for dysuria. Musculoskeletal: Negative for back pain. Skin: Negative for rash. Neurological: Negative for headaches, focal weakness or numbness.  10-point ROS otherwise negative.  ____________________________________________   PHYSICAL EXAM:  VITAL SIGNS: ED Triage Vitals [10/17/16 1930]  Enc Vitals Group     BP (!) 141/70     Pulse Rate 84     Resp 18     Temp 97.6 F (36.4 C)     Temp Source Oral     SpO2 97 %     Weight 270 lb (122.5 kg)     Height 5\' 8"  (1.727 m)     Head Circumference      Peak Flow      Pain Score 6     Pain Loc      Pain Edu?      Excl. in GC?     Constitutional: Alert and oriented. Well appearing and in no acute distress. Eyes: Conjunctivae are normal. PERRL. EOMI. Head: Atraumatic.  Nose: No congestion/rhinnorhea. Mouth/Throat: Mucous membranes are moist.  Oropharynx non-erythematous. Neck: No stridor Cardiovascular: Normal rate, regular rhythm. Grossly normal heart sounds.  Good peripheral circulation. Respiratory: Normal respiratory effort.  No retractions. Lungs CTAB. Gastrointestinal: Soft and nontender. No distention. No abdominal bruits. No CVA tenderness. Musculoskeletal:Patient has no bony tenderness in his legs. Has good capillary refill. Sensation distally. Patient has pain on palpation in the middle of the posterior thigh and the left leg. There is no bruising. There is no swelling. Neurologic:  Normal speech and language.  No gross focal neurologic deficits are appreciated.  Skin:  Skin is warm, dry and intact. No rash noted. Psychiatric: Mood and affect are normal. Speech and behavior are normal.  ____________________________________________   LABS (all labs ordered are listed, but only abnormal results are displayed)  Labs Reviewed  BASIC METABOLIC PANEL - Abnormal; Notable for the following:       Result Value   Sodium 134 (*)    Glucose, Bld 177 (*)    BUN 25 (*)    Creatinine, Ser 1.60 (*)    GFR calc non Af Amer 38 (*)    GFR calc Af Amer 44 (*)    All other components within normal limits   ____________________________________________  EKG   ____________________________________________  RADIOLOGY   ____________________________________________   PROCEDURES  Procedure(s) performed:  Procedures  Critical Care performed:   ____________________________________________   INITIAL IMPRESSION / ASSESSMENT AND PLAN / ED COURSE  Pertinent labs & imaging results that were available during my care of the patient were reviewed by me and considered in my medical decision making (see chart for detai   Patient unable to bear weight says it hurts to much he is very large his wife is not.        ____________________________________________   FINAL CLINICAL IMPRESSION(S) / ED DIAGNOSES  Final diagnoses:  Left leg pain      NEW MEDICATIONS STARTED DURING THIS VISIT:  New Prescriptions   OXYCODONE-ACETAMINOPHEN (ROXICET) 5-325 MG TABLET    Take 1 tablet by mouth every 6 (six) hours as needed.     Note:  This document was prepared using Dragon voice recognition software and may include unintentional dictation errors.    Arnaldo NatalPaul F Jerilyn Gillaspie, MD 10/17/16 2221    Arnaldo NatalPaul F Quiana Cobaugh, MD 10/17/16 2221

## 2016-10-17 NOTE — ED Notes (Signed)
Patient reports that pain is well controlled as long as extremity is stationary; currently rates pain 1/10. Daughter at bedside; expresses concerns related to patient's mobility at home. Inquiring about POC. RN advised that admitting physician would discuss further imaging with them at the time of consult. Daughter goes on to state "even if nothing is found to be wrong, will they support the leg until he heals"? RN advised patient that immobilization could be considered, however this would reduce his mobility as well. Spouse concerned about ability of patient to get into their "high bed" at home. Asking about home health for DME until he is well.

## 2016-10-17 NOTE — ED Notes (Signed)
Patient refusing to get OOB and attempt ambulation trial citing that he will be in too much pain. RN has tried, as has an EDT, however patient verbalizes that he does not feel as if his leg will support him. This information has been communicated to Dr. Darnelle CatalanMalinda; MD to speak with patient. Of note, patient reporting that his pain is well controlled at this time,

## 2016-10-17 NOTE — ED Notes (Signed)
Dr. Lenord FellersHuglemeyer in to see and assess patient for admission to inpatient unit at this time.

## 2016-10-17 NOTE — ED Notes (Signed)
Patient with noted oxygen desaturation following second dose of IV Morphine. CAO x 4, however not breathing as deeply as he would normally. Patient encouraged to take deep breaths through his nose; sat improve to 98%. When left unstimulated, patient noted to desat to 88% on RA. Patient placed on supplemental oxygen at 2L via [place]. Saturations observed by RN; remain consistently in the upper 90s. Will continue to monitor.

## 2016-10-18 LAB — CBC
HCT: 38.9 % — ABNORMAL LOW (ref 40.0–52.0)
Hemoglobin: 13.2 g/dL (ref 13.0–18.0)
MCH: 31.2 pg (ref 26.0–34.0)
MCHC: 34 g/dL (ref 32.0–36.0)
MCV: 91.6 fL (ref 80.0–100.0)
PLATELETS: 153 10*3/uL (ref 150–440)
RBC: 4.25 MIL/uL — AB (ref 4.40–5.90)
RDW: 14.6 % — AB (ref 11.5–14.5)
WBC: 9.2 10*3/uL (ref 3.8–10.6)

## 2016-10-18 LAB — GLUCOSE, CAPILLARY
GLUCOSE-CAPILLARY: 179 mg/dL — AB (ref 65–99)
Glucose-Capillary: 185 mg/dL — ABNORMAL HIGH (ref 65–99)
Glucose-Capillary: 106 mg/dL — ABNORMAL HIGH (ref 65–99)
Glucose-Capillary: 127 mg/dL — ABNORMAL HIGH (ref 65–99)

## 2016-10-18 MED ORDER — OXYCODONE HCL 5 MG PO TABS
5.0000 mg | ORAL_TABLET | ORAL | Status: DC | PRN
  Administered 2016-10-18 (×2): 5 mg via ORAL
  Filled 2016-10-18 (×3): qty 1

## 2016-10-18 MED ORDER — SODIUM CHLORIDE 0.9 % IV SOLN
INTRAVENOUS | Status: DC
  Administered 2016-10-18: 01:00:00 via INTRAVENOUS

## 2016-10-18 MED ORDER — INSULIN LISPRO 100 UNIT/ML (KWIKPEN): 25.0000 [IU] | PEN_INJECTOR | Freq: Two times a day (BID) | SUBCUTANEOUS | Status: DC

## 2016-10-18 MED ORDER — ACETAMINOPHEN 650 MG RE SUPP: 650.0000 mg | Freq: Four times a day (QID) | RECTAL | Status: DC | PRN

## 2016-10-18 MED ORDER — INSULIN ASPART 100 UNIT/ML ~~LOC~~ SOLN
25.0000 [IU] | Freq: Every day | SUBCUTANEOUS | Status: DC
  Administered 2016-10-18: 25 [IU] via SUBCUTANEOUS
  Filled 2016-10-18: qty 25

## 2016-10-18 MED ORDER — COENZYME Q10 30 MG PO CAPS: 30.0000 mg | ORAL_CAPSULE | Freq: Every day | ORAL | Status: DC

## 2016-10-18 MED ORDER — OXYCODONE HCL 5 MG PO TABS
5.0000 mg | ORAL_TABLET | ORAL | Status: DC | PRN
  Administered 2016-10-18: 5 mg via ORAL
  Filled 2016-10-18: qty 1

## 2016-10-18 MED ORDER — MAGNESIUM CITRATE PO SOLN: 1.0000 | Freq: Once | ORAL | Status: DC | PRN

## 2016-10-18 MED ORDER — ACETAMINOPHEN 325 MG PO TABS: 650.0000 mg | ORAL_TABLET | Freq: Four times a day (QID) | ORAL | Status: DC | PRN

## 2016-10-18 MED ORDER — SIMVASTATIN 20 MG PO TABS
40.0000 mg | ORAL_TABLET | Freq: Every day | ORAL | Status: DC
  Administered 2016-10-18 – 2016-10-20 (×3): 40 mg via ORAL
  Filled 2016-10-18 (×3): qty 2

## 2016-10-18 MED ORDER — INSULIN ASPART 100 UNIT/ML ~~LOC~~ SOLN
15.0000 [IU] | Freq: Every day | SUBCUTANEOUS | Status: DC
  Administered 2016-10-19: 15 [IU] via SUBCUTANEOUS
  Filled 2016-10-18: qty 15

## 2016-10-18 MED ORDER — TAMSULOSIN HCL 0.4 MG PO CAPS
0.4000 mg | ORAL_CAPSULE | Freq: Every day | ORAL | Status: DC
  Administered 2016-10-18 – 2016-10-20 (×3): 0.4 mg via ORAL
  Filled 2016-10-18 (×3): qty 1

## 2016-10-18 MED ORDER — LEVOTHYROXINE SODIUM 50 MCG PO TABS
50.0000 ug | ORAL_TABLET | Freq: Every day | ORAL | Status: DC
  Administered 2016-10-18 – 2016-10-20 (×3): 50 ug via ORAL
  Filled 2016-10-18 (×3): qty 1

## 2016-10-18 MED ORDER — LOPERAMIDE HCL 2 MG PO CAPS
4.0000 mg | ORAL_CAPSULE | Freq: Once | ORAL | Status: DC
Start: 2016-10-18 — End: 2016-10-20

## 2016-10-18 MED ORDER — ENOXAPARIN SODIUM 40 MG/0.4ML ~~LOC~~ SOLN
40.0000 mg | Freq: Two times a day (BID) | SUBCUTANEOUS | Status: DC
  Administered 2016-10-18 – 2016-10-20 (×6): 40 mg via SUBCUTANEOUS
  Filled 2016-10-18 (×6): qty 0.4

## 2016-10-18 MED ORDER — SENNOSIDES-DOCUSATE SODIUM 8.6-50 MG PO TABS: 1.0000 | ORAL_TABLET | Freq: Every evening | ORAL | Status: DC | PRN

## 2016-10-18 MED ORDER — QUINAPRIL HCL 10 MG PO TABS
20.0000 mg | ORAL_TABLET | Freq: Every day | ORAL | Status: DC
  Administered 2016-10-19 – 2016-10-20 (×2): 20 mg via ORAL
  Filled 2016-10-18 (×3): qty 2

## 2016-10-18 MED ORDER — INSULIN GLARGINE 100 UNIT/ML ~~LOC~~ SOLN
45.0000 [IU] | Freq: Every day | SUBCUTANEOUS | Status: DC
  Administered 2016-10-18 – 2016-10-19 (×3): 45 [IU] via SUBCUTANEOUS
  Filled 2016-10-18 (×4): qty 0.45

## 2016-10-18 MED ORDER — METHOCARBAMOL 500 MG PO TABS
500.0000 mg | ORAL_TABLET | Freq: Three times a day (TID) | ORAL | Status: DC
  Administered 2016-10-18 – 2016-10-20 (×6): 500 mg via ORAL
  Filled 2016-10-18 (×6): qty 1

## 2016-10-18 MED ORDER — ONDANSETRON 4 MG PO TBDP
4.0000 mg | ORAL_TABLET | Freq: Three times a day (TID) | ORAL | Status: DC | PRN
  Filled 2016-10-18: qty 1

## 2016-10-18 MED ORDER — METHOCARBAMOL 500 MG PO TABS: 500.0000 mg | ORAL_TABLET | Freq: Four times a day (QID) | ORAL | Status: DC | PRN

## 2016-10-18 MED ORDER — BISACODYL 5 MG PO TBEC: 5.0000 mg | DELAYED_RELEASE_TABLET | Freq: Every day | ORAL | Status: DC | PRN

## 2016-10-18 MED ORDER — LIDOCAINE 5 % EX PTCH
1.0000 | MEDICATED_PATCH | CUTANEOUS | Status: DC
Start: 2016-10-18 — End: 2016-10-20
  Administered 2016-10-18 – 2016-10-20 (×3): 1 via TRANSDERMAL
  Filled 2016-10-18 (×4): qty 1

## 2016-10-18 MED ORDER — INSULIN ASPART 100 UNIT/ML ~~LOC~~ SOLN
12.0000 [IU] | Freq: Every day | SUBCUTANEOUS | Status: DC
  Administered 2016-10-19 – 2016-10-20 (×2): 12 [IU] via SUBCUTANEOUS
  Filled 2016-10-18 (×2): qty 12

## 2016-10-18 MED ORDER — VITAMIN B-6 50 MG PO TABS
100.0000 mg | ORAL_TABLET | Freq: Every day | ORAL | Status: DC
  Administered 2016-10-18 – 2016-10-20 (×3): 100 mg via ORAL
  Filled 2016-10-18 (×3): qty 2

## 2016-10-18 MED ORDER — TRAMADOL HCL 50 MG PO TABS
50.0000 mg | ORAL_TABLET | Freq: Four times a day (QID) | ORAL | Status: DC | PRN
  Administered 2016-10-18 – 2016-10-20 (×2): 50 mg via ORAL
  Filled 2016-10-18 (×2): qty 1

## 2016-10-18 MED ORDER — OXYCODONE HCL 5 MG PO TABS: 10.0000 mg | ORAL_TABLET | ORAL | Status: DC | PRN

## 2016-10-18 MED ORDER — INSULIN ASPART 100 UNIT/ML ~~LOC~~ SOLN
30.0000 [IU] | Freq: Every day | SUBCUTANEOUS | Status: DC
  Filled 2016-10-18: qty 30

## 2016-10-18 MED ORDER — ALBUTEROL SULFATE (2.5 MG/3ML) 0.083% IN NEBU: 2.5000 mg | INHALATION_SOLUTION | Freq: Four times a day (QID) | RESPIRATORY_TRACT | Status: DC | PRN

## 2016-10-18 NOTE — Progress Notes (Signed)
Pt's capillary blood glucose dropped 79 points between lunchtime and dinner. MD notified as pt is currently ordered to receive an additional 30units of novolog with his dinner. Per MD will hold today's dinnertime insulin and [facility] Per MD will continue pt's home dose of lantus.

## 2016-10-18 NOTE — Progress Notes (Signed)
[facility] [place] [facility]   PATIENT NAME: Cory [name]    MR#:  [id number]  DATE OF BIRTH:  25-Aug-1931  SUBJECTIVE:   Came in after patient had a fall while trying to walk his dog and catches hat and was flying later to face front without any external injuries. Appears to have pulled a muscle in the back of his thigh. REVIEW OF SYSTEMS:   Review of Systems  Constitutional: Negative for chills, fever and weight loss.  HENT: Negative for ear discharge, ear pain and nosebleeds.   Eyes: Negative for blurred vision, pain and discharge.  Respiratory: Negative for sputum production, shortness of breath, wheezing and stridor.   Cardiovascular: Negative for chest pain, palpitations, orthopnea and PND.  Gastrointestinal: Negative for abdominal pain, diarrhea, nausea and vomiting.  Genitourinary: Negative for frequency and urgency.  Musculoskeletal: Positive for falls and joint pain. Negative for back pain.  Neurological: Positive for weakness. Negative for sensory change, speech change and focal weakness.  Psychiatric/Behavioral: Negative for depression and hallucinations. The patient is not nervous/anxious.    Tolerating Diet: Yes Tolerating PT: Rehabilitation   DRUG ALLERGIES:  No Known Allergies  VITALS:  Blood pressure (!) 133/52, pulse (!) 55, temperature 98.1 F (36.7 C), temperature source Oral, resp. rate 18, height 5\' 8"  (1.727 m), weight 126.1 kg (277 lb 14.4 oz), SpO2 96 %.  PHYSICAL EXAMINATION:   Physical Exam  GENERAL:  83 y.o.-year-old patient lying in the bed with no acute distress. Obese EYES: Pupils equal, round, reactive to light and accommodation. No scleral icterus. Extraocular muscles intact.  HEENT: Head atraumatic, normocephalic. Oropharynx and nasopharynx clear.  NECK:  Supple, no jugular venous distention. No thyroid enlargement, no tenderness.  LUNGS: Normal breath sounds bilaterally, no wheezing, rales, rhonchi. No use of  accessory muscles of respiration.  CARDIOVASCULAR: S1, S2 normal. No murmurs, rubs, or gallops.  ABDOMEN: Soft, nontender, nondistended. Bowel sounds present. No organomegaly or mass.  EXTREMITIES: No cyanosis, clubbing or edema b/l.   Point tenderness over the left hamstring posteriorly no bruise hematoma. Good range of motion at hip and knee and ankle joint NEUROLOGIC: Cranial nerves II through XII are intact. No focal Motor or sensory deficits b/l.   PSYCHIATRIC:  patient is alert and oriented x 3.  SKIN: No obvious rash, lesion, or ulcer.   LABORATORY PANEL:  CBC  Recent Labs Lab 10/18/16 0344  WBC 9.2  HGB 13.2  HCT 38.9*  PLT 153    Chemistries   Recent Labs Lab 10/17/16 1942  NA 134*  K 4.2  CL 102  CO2 23  GLUCOSE 177*  BUN 25*  CREATININE 1.60*  CALCIUM 9.2   Cardiac Enzymes No results for input(s): TROPONINI in the last 168 hours. RADIOLOGY:  Dg Femur Min 2 Views Left  Result Date: 10/17/2016 CLINICAL DATA:  Distal left femoral pain after slip and fall on ice EXAM: LEFT FEMUR 2 VIEWS COMPARISON:  None. FINDINGS: There is no evidence of acute left femoral fracture. The hip and knee joints are maintained. No joint effusion. Calcific tendinopathy along the course of the left iliopsoas. Enthesophytes are noted off the left greater trochanter and ischial tuberosity. The pubic rami are intact. Soft tissues are unremarkable. IMPRESSION: No acute fracture.  Iliopsoas calcific tendinopathy. Electronically Signed   By: Tollie Ethavid  Kwon M.D.   On: 10/17/2016 20:27   ASSESSMENT AND PLAN:  83 y.o. male with a history ofdiabetes, hyperlipidemia, hypothyroidism, osteoarthritis the right hip, CK D  stage III and obstructive sleep apnea  now being admitted with: 1. Intractable pain left thigh pain s/p mechanical fall.  -There is no evidence of any tenderness or bony injury. However given patient's gait instability and severe intractable pain we will admit for observation - Pain  control - PT eval Recommends rehabilitation social worker consulted -Added muscle relaxants  2. Hypotension - Gentle IVFs Resolved  3. DM - Continue home meds including Humalog, Lantus, quinapril  4. Hyperlipidemia -Continue Zocor  5. History of hypothyroid -Continue Synthroid  6. History of BPH -Continue Flomax  7. History of osteoarthritis of the hip -Continue Ultram -Recommend discontinuing Aleve given history of chronic kidney disease  8. CK D stage III -Creatinine near baseline  Discharge once rehabilitation bed available  Case discussed with Care Management/Social Worker. Management plans discussed with the patient, family and they are in agreement.  CODE STATUS: Full DVT Prophylaxis: Lovenox TOTAL TIME TAKING CARE OF THIS PATIENT: 30 minutes.  >50% time spent on counselling and coordination of care  POSSIBLE D/C IN one to 2 DAYS, DEPENDING ON CLINICAL CONDITION. And bed availability at rehabilitation place  Note: This dictation was prepared with Dragon dictation along with smaller phrase technology. Any transcriptional errors that result from this process are unintentional.  Kelsa Jaworowski M.D on 10/18/2016 at 2:16 PM  Between 7am to 6pm - Pager - [phone]  After 6pm go to [email or web address] - password [email or web address] [facility]  [place] [facility]  Office  [phone]  CC: Primary care physician; Rafael Bihari, MD

## 2016-10-18 NOTE — Evaluation (Signed)
Physical Therapy Evaluation Patient Details Name: Cory [name] MRN: [id number] DOB: 01-May-1931 Today's Date: 10/18/2016   History of Present Illness  Pt is a 83 y/o M who presented s/p fall experiencing excruciating pain behind his L leg ~6 inches superior to posterior knee. Imaging of L femur revealed no acute fracture, iliopsoas calcific tendinopathy.  Per chart review, consider MRI if pain not adequately controlled and or if there is evidence of soft tissue injury.    Clinical Impression  Pt admitted with above diagnosis. Pt currently with functional limitations due to the deficits listed below (see PT Problem List). Cory [name] reports excruciating pain with any mobility of LLE throughout session.  Pt sliding LLE behind him as he ambulates 20 ft in room and reports "seeing stars" due to pain.  He requires min assist for sit<>stand and stand pivot transfers and close min guard assist for safe short distance ambulation.  Given pt's current mobility status and the fact that his wife is unable to provide any physical assist at home, recommending SNF at d/c.  Pt will benefit from skilled PT to increase their independence and safety with mobility to allow discharge to the venue listed below.      Follow Up Recommendations SNF    Equipment Recommendations  None recommended by PT    Recommendations for Other Services       Precautions / Restrictions Precautions Precautions: Fall Restrictions Weight Bearing Restrictions: No      Mobility  Bed Mobility Overal bed mobility: Needs Assistance Bed Mobility: Supine to Sit     Supine to sit: Min guard;HOB elevated     General bed mobility comments: Heavy use of bed rail with increased time and effort.  Transfers Overall transfer level: Needs assistance Equipment used: Rolling walker (2 wheeled) Transfers: Sit to/from [employer] Sit to Stand: Min assist Stand pivot transfers: Min assist       General transfer  comment: Assist to boost to standing and to remain steady as pt reports excruciating pain with any mobility of LLE.  Pt WBing heavily through RW to pivot to chair.  Ambulation/Gait Ambulation/Gait assistance: Min guard Ambulation Distance (Feet): 20 Feet Assistive device: Rolling walker (2 wheeled) Gait Pattern/deviations: Step-to pattern;Decreased stride length;Decreased stance time - left;Decreased step length - right;Decreased step length - left;Antalgic;Decreased weight shift to left;Trunk flexed Gait velocity: decreased Gait velocity interpretation: <1.8 ft/sec, indicative of risk for recurrent falls General Gait Details: Flexed posture as pt WBing heavily through BUEs on RW due to pain in LLE with any mobility.  Pt sliding LLE behind RW reporting that he cannot bring it forward.  Pt reports "seeing stars" due to pain while ambulating and was instructed to return to chair.    Stairs            Wheelchair Mobility    Modified Rankin (Stroke Patients Only)       Balance Overall balance assessment: Needs assistance;History of Falls Sitting-balance support: No upper extremity supported;Feet supported Sitting balance-Leahy Scale: Good     Standing balance support: Bilateral upper extremity supported;During functional activity Standing balance-Leahy Scale: Poor Standing balance comment: Relies on RW for support                             Pertinent Vitals/Pain Pain Assessment: 0-10 Pain Score: 9  Pain Location: posterior L thigh with mobility Pain Descriptors / Indicators: Aching;Grimacing;Guarding;Moaning Pain Intervention(s): Limited activity within patient's tolerance;Monitored during  session;Repositioned;Premedicated before session    Home Living Family/patient expects to be discharged to:: Private residence Living Arrangements: Spouse/significant other Available Help at Discharge: Family;Available PRN/intermittently (wife unable to provide physical  assist) Type of Home: House Home Access: Stairs to enter Entrance Stairs-Rails: None Entrance Stairs-Number of Steps: 1 Home Layout: Laundry or work area in basement;Two level;Able to live on main level with bedroom/bathroom Home Equipment: Dan HumphreysWalker - 2 wheels;Cane - single point      Prior Function Level of Independence: Independent         Comments: Pt independent PTA and pt denies any other falls in the past 6 months.  He walks his dog 4 miles each day.     Hand Dominance        Extremity/Trunk Assessment   Upper Extremity Assessment Upper Extremity Assessment: Overall WFL for tasks assessed    Lower Extremity Assessment Lower Extremity Assessment: LLE deficits/detail LLE Deficits / Details: limited assessment as pt in excruciating pain with light mobility.      Cervical / Trunk Assessment Cervical / Trunk Assessment: Kyphotic  Communication   Communication: No difficulties  Cognition Arousal/Alertness: Awake/alert Behavior During Therapy: WFL for tasks assessed/performed Overall Cognitive Status: Within Functional Limits for tasks assessed                      General Comments      Exercises     Assessment/Plan    PT Assessment Patient needs continued PT services  PT Problem List Decreased strength;Decreased activity tolerance;Decreased balance;Pain;Decreased knowledge of use of DME;Decreased safety awareness;Decreased mobility          PT Treatment Interventions DME instruction;Gait training;Stair training;Functional mobility training;Therapeutic activities;Therapeutic exercise;Balance training;Neuromuscular re-education;Patient/family education;Modalities;Manual techniques    PT Goals (Current goals can be found in the Care Plan section)  Acute Rehab PT Goals Patient Stated Goal: rehab before home PT Goal Formulation: With patient Time For Goal Achievement: 11/01/16 Potential to Achieve Goals: Good    Frequency 7X/week   Barriers to  discharge Inaccessible home environment;Decreased caregiver support Steps to enter home and wife unable to provide physical assist    Co-evaluation               End of Session Equipment Utilized During Treatment: Gait belt Activity Tolerance: Patient limited by pain Patient left: in chair;with call bell/phone within reach;with chair alarm set Nurse Communication: Mobility status    Functional Assessment Tool Used: Clinical Judgement Functional Limitation: Mobility: Walking and moving around Mobility: Walking and Moving Around Current Status ([id number](G8978): At least 20 percent but less than 40 percent impaired, limited or restricted Mobility: Walking and Moving Around Goal Status [phone]([id number]): At least 1 percent but less than 20 percent impaired, limited or restricted    Time: [id number] PT Time Calculation (min) (ACUTE ONLY): 22 min   Charges:   PT Evaluation $PT Eval Low Complexity: 1 Procedure     PT G Codes:   PT G-Codes **NOT FOR INPATIENT CLASS** Functional Assessment Tool Used: Clinical Judgement Functional Limitation: Mobility: Walking and moving around Mobility: Walking and Moving Around Current Status ([id number](G8978): At least 20 percent but less than 40 percent impaired, limited or restricted Mobility: Walking and Moving Around Goal Status [phone](G8979): At least 1 percent but less than 20 percent impaired, limited or restricted    Encarnacion ChuAshley Abashian PT, DPT 10/18/2016, 1:27 PM

## 2016-10-18 NOTE — Clinical Social Work Note (Signed)
Clinical Social Work Assessment  Patient Details  Name: Cory [name] MRN: [id number] Date of Birth: 06-05-1931  Date of referral:  10/18/16               Reason for consult:  Facility Placement                Permission sought to share information with:  Chartered certified accountant granted to share information::  Yes, Verbal Permission Granted  Name::        Agency::     Relationship::     Contact Information:     Housing/Transportation Living arrangements for the past 2 months:  Single Family Home Source of Information:  Patient Patient Interpreter Needed:  None Criminal Activity/Legal Involvement Pertinent to Current Situation/Hospitalization:  No - Comment as needed Significant Relationships:  Adult Children, Community Support Lives with:  Spouse Do you feel safe going back to the place where you live?  Yes Need for family participation in patient care:  No (Coment)  Care giving concerns:  PT recommends STR   Social Worker assessment / plan:  CSW met patient at bedside to explain role in dc planning and PT recommendations. Patient reported that he is in agreement with recommendation and gave verbal permission to conduct bed search. The patient reported that his preference is [facility].   At baseline, the patient ambulates with no assistance and is independent in all ADLs and IADLs. The patient resides in a single family home with his wife, and he reports that she is supportive. The patient also has a dog, and he indicates that the dog is docile and a minimal trip risk.    Employment status:  Retired Nurse, adult PT Recommendations:  Rachel / Referral to community resources:     Patient/Family's Response to care:  Patient thanked CSW for assistance and care.  Patient/Family's Understanding of and Emotional Response to Diagnosis, Current Treatment, and Prognosis:  Patient understands recommendation for  STR and is in agreement.  Emotional Assessment Appearance:  Appears stated age Attitude/Demeanor/Rapport:   (Very pleasant) Affect (typically observed):  Accepting, Appropriate, Pleasant Orientation:  Oriented to Self, Oriented to Place, Oriented to  Time, Oriented to Situation Alcohol / Substance use:  Never Used Psych involvement (Current and /or in the community):  No (Comment)  Discharge Needs  Concerns to be addressed:  Discharge Planning Concerns, Care Coordination Readmission within the last 30 days:  No Current discharge risk:  None Barriers to Discharge:  Continued Medical Work up   [employer], LCSW 10/18/2016, 4:39 PM

## 2016-10-18 NOTE — Progress Notes (Signed)
Pt's diastolic blood pressure is on the low side at 53 this morning. Per pt his "bottom number" on his blood pressure usually runs in the 70s and he "has never seen it this low". Pt denies SOB, CP, or dizziness. MD notified, will hold quinapril this AM.

## 2016-10-18 NOTE — Progress Notes (Signed)
PHARMACIST - PHYSICIAN ORDER COMMUNICATION  CONCERNING: P&T Medication Policy on Herbal Medications  DESCRIPTION:  This patient's order for:  Coenzyme Q-10  has been noted.  This product(s) is classified as an "herbal" or natural product. Due to a lack of definitive safety studies or FDA approval, nonstandard manufacturing practices, plus the potential risk of unknown drug-drug interactions while on inpatient medications, the Pharmacy and Therapeutics Committee does not permit the use of "herbal" or natural products of this type within [facility].   ACTION TAKEN: The pharmacy department is unable to verify this order at this time. Please reevaluate patient's clinical condition at discharge and address if the herbal or natural product(s) should be resumed at that time.   

## 2016-10-18 NOTE — NC FL2 (Signed)
[place] MEDICAID FL2 LEVEL OF CARE SCREENING TOOL     IDENTIFICATION  Patient Name: Cory [name] Birthdate: 1931/06/21 Sex: male Admission Date (Current Location): 10/17/2016  [place]   Facility and Address:  [facility], [address], [place], [place] [address]      Provider Number: [id number]  Attending Physician Name and Address:  Enedina FinnerSona Patel, MD  Relative Name and Phone Number:       Current Level of Care: Hospital Recommended Level of Care: Skilled Nursing Facility Prior Approval Number:    Date Approved/Denied: 11/08/13 PASRR Number: [id number]  Discharge Plan: SNF    Current Diagnoses: Patient Active Problem List   Diagnosis Date Noted  . Intractable pain 10/17/2016    Orientation RESPIRATION BLADDER Height & Weight     Self, Time, Situation, Place  Normal Continent Weight: 277 lb 14.4 oz (126.1 kg) Height:  5\' 8"  (172.7 cm)  BEHAVIORAL SYMPTOMS/MOOD NEUROLOGICAL BOWEL NUTRITION STATUS      Continent    AMBULATORY STATUS COMMUNICATION OF NEEDS Skin   Extensive Assist Verbally Normal                       Personal Care Assistance Level of Assistance  Bathing, Dressing, Feeding Bathing Assistance: Independent Feeding assistance: Independent Dressing Assistance: Limited assistance     Functional Limitations Info             SPECIAL CARE FACTORS FREQUENCY  PT (By licensed PT)     PT Frequency: Up to 5X per day, 5 days per week              Contractures Contractures Info: Present    Additional Factors Info                  Current Medications (10/18/2016):  This is the current hospital active medication list Current Facility-Administered Medications  Medication Dose Route Frequency Provider Last Rate Last Dose  . acetaminophen (TYLENOL) tablet 650 mg  650 mg Oral Q6H PRN Alexis Hugelmeyer, DO       Or  . acetaminophen (TYLENOL) suppository 650 mg  650 mg Rectal Q6H  PRN Alexis Hugelmeyer, DO      . albuterol (PROVENTIL) (2.5 MG/3ML) 0.083% nebulizer solution 2.5 mg  2.5 mg Nebulization Q6H PRN Alexis Hugelmeyer, DO      . bisacodyl (DULCOLAX) EC tablet 5 mg  5 mg Oral Daily PRN Alexis Hugelmeyer, DO      . enoxaparin (LOVENOX) injection 40 mg  40 mg Subcutaneous BID Alexis Hugelmeyer, DO   40 mg at 10/18/16 1033  . insulin aspart (novoLOG) injection 25 Units  25 Units Subcutaneous QAC lunch Alexis Hugelmeyer, DO   25 Units at 10/18/16 1309   And  . insulin aspart (novoLOG) injection 30 Units  30 Units Subcutaneous QAC supper Alexis Hugelmeyer, DO      . insulin glargine (LANTUS) injection 45 Units  45 Units Subcutaneous Q2200 Alexis Hugelmeyer, DO   45 Units at 10/18/16 0108  . levothyroxine (SYNTHROID, LEVOTHROID) tablet 50 mcg  50 mcg Oral QAC breakfast Alexis Hugelmeyer, DO   50 mcg at 10/18/16 0755  . lidocaine (LIDODERM) 5 % 1 patch  1 patch Transdermal Q24H Enedina FinnerSona Patel, MD   1 patch at 10/18/16 1203  . loperamide (IMODIUM) capsule 4 mg  4 mg Oral Once [employer] Hugelmeyer, DO      . magnesium citrate solution 1 Bottle  1 Bottle Oral  Once PRN [employer], DO      . methocarbamol (ROBAXIN) tablet 500 mg  500 mg Oral Q8H Enedina Finner, MD   500 mg at 10/18/16 1635  . ondansetron (ZOFRAN-ODT) disintegrating tablet 4 mg  4 mg Oral Q8H PRN Alexis Hugelmeyer, DO      . oxyCODONE (Oxy IR/ROXICODONE) immediate release tablet 5 mg  5 mg Oral Q4H PRN Enedina Finner, MD   5 mg at 10/18/16 1032  . pyridOXINE (VITAMIN B-6) tablet 100 mg  100 mg Oral Daily Alexis Hugelmeyer, DO   100 mg at 10/18/16 1032  . quinapril (ACCUPRIL) tablet 20 mg  20 mg Oral Daily Alexis Hugelmeyer, DO   Stopped at 10/18/16 1000  . senna-docusate (Senokot-S) tablet 1 tablet  1 tablet Oral QHS PRN Alexis Hugelmeyer, DO      . simvastatin (ZOCOR) tablet 40 mg  40 mg Oral Daily Alexis Hugelmeyer, DO   40 mg at 10/18/16 1032  . tamsulosin (FLOMAX) capsule 0.4 mg  0.4 mg Oral Daily Alexis Hugelmeyer,  DO   0.4 mg at 10/18/16 1032  . traMADol (ULTRAM) tablet 50 mg  50 mg Oral Q6H PRN Alexis Hugelmeyer, DO   50 mg at 10/18/16 0108     Discharge Medications: Please see discharge summary for a list of discharge medications.  Relevant Imaging Results:  Relevant Lab Results:   Additional Information SS# [id number]  Judi Cong, LCSW

## 2016-10-18 NOTE — Progress Notes (Signed)
Pharmacist - Prescriber Communication  Enoxaparin modified to 40 mg subcutaneously BID for BMI greater than 40.  Orianna Biskup A. [place], VermontPharm.D., BCPS Clinical Pharmacist 10/18/2016 [phone]

## 2016-10-19 LAB — GLUCOSE, CAPILLARY
GLUCOSE-CAPILLARY: 172 mg/dL — AB (ref 65–99)
GLUCOSE-CAPILLARY: 199 mg/dL — AB (ref 65–99)
Glucose-Capillary: 137 mg/dL — ABNORMAL HIGH (ref 65–99)
Glucose-Capillary: 140 mg/dL — ABNORMAL HIGH (ref 65–99)

## 2016-10-19 NOTE — Progress Notes (Signed)
[facility] [facility]   PATIENT NAME: Cory [name]    MR#:  [id number]  DATE OF BIRTH:  10-24-1930  SUBJECTIVE:    Came in after patient had a fall while trying to walk his dog and catches hat and was flying later to face front without any external injuries. Appears to have pulled a muscle in the back of his thigh. Patient feels better today  REVIEW OF SYSTEMS:   Review of Systems  Constitutional: Negative for chills, fever and weight loss.  HENT: Negative for ear discharge, ear pain and nosebleeds.   Eyes: Negative for blurred vision, pain and discharge.  Respiratory: Negative for sputum production, shortness of breath, wheezing and stridor.   Cardiovascular: Negative for chest pain, palpitations, orthopnea and PND.  Gastrointestinal: Negative for abdominal pain, diarrhea, nausea and vomiting.  Genitourinary: Negative for frequency and urgency.  Musculoskeletal: Positive for falls and joint pain. Negative for back pain.  Neurological: Positive for weakness. Negative for sensory change, speech change and focal weakness.  Psychiatric/Behavioral: Negative for depression and hallucinations. The patient is not nervous/anxious.    Tolerating Diet: Yes Tolerating PT: Rehabilitation   DRUG ALLERGIES:  No Known Allergies  VITALS:  Blood pressure (!) 145/53, pulse (!) 56, temperature 97.4 F (36.3 C), temperature source Oral, resp. rate 20, height 5\' 8"  (1.727 m), weight 126.1 kg (277 lb 14.4 oz), SpO2 93 %.  PHYSICAL EXAMINATION:   Physical Exam  GENERAL:  83 y.o.-year-old patient lying in the bed with no acute distress. Obese EYES: Pupils equal, round, reactive to light and accommodation. No scleral icterus. Extraocular muscles intact.  HEENT: Head atraumatic, normocephalic. Oropharynx and nasopharynx clear.  NECK:  Supple, no jugular venous distention. No thyroid enlargement, no tenderness.  LUNGS: Normal breath sounds bilaterally, no  wheezing, rales, rhonchi. No use of accessory muscles of respiration.  CARDIOVASCULAR: S1, S2 normal. No murmurs, rubs, or gallops.  ABDOMEN: Soft, nontender, nondistended. Bowel sounds present. No organomegaly or mass.  EXTREMITIES: No cyanosis, clubbing or edema b/l.   Point tenderness over the left hamstring posteriorly no bruise hematoma. Good range of motion at hip and knee and ankle joint NEUROLOGIC: Cranial nerves II through XII are intact. No focal Motor or sensory deficits b/l.   PSYCHIATRIC:  patient is alert and oriented x 3.  SKIN: No obvious rash, lesion, or ulcer.   LABORATORY PANEL:  CBC  Recent Labs Lab 10/18/16 0344  WBC 9.2  HGB 13.2  HCT 38.9*  PLT 153    Chemistries   Recent Labs Lab 10/17/16 1942  NA 134*  K 4.2  CL 102  CO2 23  GLUCOSE 177*  BUN 25*  CREATININE 1.60*  CALCIUM 9.2   Cardiac Enzymes No results for input(s): TROPONINI in the last 168 hours. RADIOLOGY:  Dg Femur Min 2 Views Left  Result Date: 10/17/2016 CLINICAL DATA:  Distal left femoral pain after slip and fall on ice EXAM: LEFT FEMUR 2 VIEWS COMPARISON:  None. FINDINGS: There is no evidence of acute left femoral fracture. The hip and knee joints are maintained. No joint effusion. Calcific tendinopathy along the course of the left iliopsoas. Enthesophytes are noted off the left greater trochanter and ischial tuberosity. The pubic rami are intact. Soft tissues are unremarkable. IMPRESSION: No acute fracture.  Iliopsoas calcific tendinopathy. Electronically Signed   By: Tollie Ethavid  Kwon M.D.   On: 10/17/2016 20:27   ASSESSMENT AND PLAN:  83 y.o. male with a history ofdiabetes, hyperlipidemia, hypothyroidism,  osteoarthritis the right hip, CK D stage III and obstructive sleep apnea  now being admitted with: 1. Intractable pain left thigh pain s/p mechanical fall.  -There is no evidence of any tenderness or bony injury. However given patient's gait instability and severe intractable pain we will  admit for observation - Pain control - PT eval Recommends rehabilitation social worker consulted -Added muscle relaxants  2. Hypotension - Gentle IVFs Resolved  3. DM - Continue home meds including Humalog, Lantus, quinapril  4. Hyperlipidemia -Continue Zocor  5. History of hypothyroid -Continue Synthroid  6. History of BPH -Continue Flomax  7. History of osteoarthritis of the hip -Continue Ultram -Recommend discontinuing Aleve given history of chronic kidney disease  8. CK D stage III -Creatinine near baseline  Discharge once rehabilitation bed available  Case discussed with Care Management/Social Worker. Management plans discussed with the patient, family and they are in agreement.  CODE STATUS: Full DVT Prophylaxis: Lovenox TOTAL TIME TAKING CARE OF THIS PATIENT: 30 minutes.  >50% time spent on counselling and coordination of care  POSSIBLE D/C IN one DAYS, DEPENDING ON CLINICAL CONDITION. And bed availability at rehabilitation place  Note: This dictation was prepared with Dragon dictation along with smaller phrase technology. Any transcriptional errors that result from this process are unintentional.  Mirha Brucato M.D on 10/19/2016 at 1:15 PM  Between 7am to 6pm - Pager - [phone]  After 6pm go to [email or web address] - password [email or web address]  [facility]  Office  [phone]  CC: Primary care physician; Rafael Bihari, MD

## 2016-10-19 NOTE — Care Management Obs Status (Signed)
MEDICARE OBSERVATION STATUS NOTIFICATION   Patient Details  Name: Cory [name] MRN: [id number] Date of Birth: [birth year]   Medicare Observation Status Notification Given:  Yes    Kylen Ismael A, RN 10/19/2016, 9:22 AM

## 2016-10-19 NOTE — Progress Notes (Signed)
Physical Therapy Treatment Patient Details Name: Cory [name] MRN: [id number] DOB: January 14, 1931 Today's Date: 10/19/2016    History of Present Illness Pt is a 83 y/o M who presented s/p fall experiencing excruciating pain behind his L leg ~6 inches superior to posterior knee. Imaging of L femur revealed no acute fracture, iliopsoas calcific tendinopathy.  Per chart review, consider MRI if pain not adequately controlled and or if there is evidence of soft tissue injury.    PT Comments    Pt reporting 0/10 posterior medial L thigh pain at rest in bed but pain increased to 7/10 ambulating short distance in room with RW; pain decreased down to 3-4/10 end of session with pt sitting in recliner (pt reporting pain reduced with L knee in flexed position).  Will continue to progress pt with strengthening and progressive functional mobility per pt tolerance.   Follow Up Recommendations  SNF     Equipment Recommendations  Rolling walker with 5" wheels    Recommendations for Other Services       Precautions / Restrictions Precautions Precautions: Fall Restrictions Weight Bearing Restrictions: No    Mobility  Bed Mobility Overal bed mobility: Needs Assistance Bed Mobility: Supine to Sit     Supine to sit: Supervision;HOB elevated     General bed mobility comments: Heavy use of bed rail with increased time and effort.  Transfers Overall transfer level: Needs assistance Equipment used: Rolling walker (2 wheeled) Transfers: Sit to/from [employer] Sit to Stand: Min assist Stand pivot transfers: Min assist       General transfer comment: assist to initiate stand; heavy UE support through RW; vc's for hand and feet placement  Ambulation/Gait Ambulation/Gait assistance: Min guard Ambulation Distance (Feet): 20 Feet Assistive device: Rolling walker (2 wheeled) Gait Pattern/deviations: Step-to pattern;Decreased stride length;Decreased stance time - left;Decreased step  length - right;Decreased step length - left;Antalgic;Decreased weight shift to left;Trunk flexed Gait velocity: decreased   General Gait Details: pt WB'ing through UE's heavily; step to gait pattern; limited distance d/t L posterior medial thigh pain increasing with distance   Stairs            Wheelchair Mobility    Modified Rankin (Stroke Patients Only)       Balance Overall balance assessment: Needs assistance;History of Falls Sitting-balance support: No upper extremity supported;Feet supported Sitting balance-Leahy Scale: Good     Standing balance support: Bilateral upper extremity supported;During functional activity (UE's support on RW) Standing balance-Leahy Scale: Fair Standing balance comment: significant UE support on RW                    Cognition Arousal/Alertness: Awake/alert Behavior During Therapy: WFL for tasks assessed/performed Overall Cognitive Status: Within Functional Limits for tasks assessed                      Exercises      General Comments  Pt agreeable to PT session.      Pertinent Vitals/Pain Pain Assessment: 0-10 Pain Score: 7  (7/10 with ambulation; 3-4/10 pain end of session) Pain Location: posterior L thigh with mobility Pain Descriptors / Indicators: Aching;Grimacing;Guarding;Moaning Pain Intervention(s): Limited activity within patient's tolerance;Monitored during session;Premedicated before session;Repositioned  Vitals (HR and O2 on room air) stable and WFL throughout treatment session.    Home Living                      Prior Function  PT Goals (current goals can now be found in the care plan section) Acute Rehab PT Goals Patient Stated Goal: rehab before home PT Goal Formulation: With patient Time For Goal Achievement: 11/01/16 Potential to Achieve Goals: Good Progress towards PT goals: Progressing toward goals    Frequency    Min 2X/week      PT Plan Frequency needs to  be updated    Co-evaluation             End of Session Equipment Utilized During Treatment: Gait belt Activity Tolerance: Patient limited by pain Patient left: in chair;with call bell/phone within reach;with chair alarm set     Time: [id number] PT Time Calculation (min) (ACUTE ONLY): 23 min  Charges:  $Gait Training: 8-22 mins $Therapeutic Activity: 8-22 mins                    G CodesHendricks Limes:      Jadin Kagel 10/19/2016, 2:40 PM Hendricks LimesEmily Raju Coppolino, PT [phone]

## 2016-10-20 LAB — CREATININE, SERUM
CREATININE: 1.37 mg/dL — AB (ref 0.61–1.24)
GFR calc Af Amer: 53 mL/min — ABNORMAL LOW (ref >60)
GFR calc non Af Amer: 46 mL/min — ABNORMAL LOW (ref >60)

## 2016-10-20 LAB — GLUCOSE, CAPILLARY: Glucose-Capillary: 275 mg/dL — ABNORMAL HIGH (ref 65–99)

## 2016-10-20 MED ORDER — METHOCARBAMOL 500 MG PO TABS: 500.0000 mg | ORAL_TABLET | Freq: Three times a day (TID) | ORAL | 0 refills | Status: DC

## 2016-10-20 MED ORDER — OXYCODONE-ACETAMINOPHEN 5-325 MG PO TABS: 1.0000 | ORAL_TABLET | Freq: Four times a day (QID) | ORAL | 0 refills | Status: AC | PRN

## 2016-10-20 MED ORDER — LIDOCAINE 5 % EX PTCH: 1.0000 | MEDICATED_PATCH | CUTANEOUS | 0 refills | Status: DC

## 2016-10-20 NOTE — Discharge Summary (Signed)
[facility] - [place] [facility]   PATIENT NAME: Cory [name]    MR#:  [id number]  DATE OF BIRTH:  1931-07-16  DATE OF ADMISSION:  10/17/2016 ADMITTING PHYSICIAN: Tonye Royalty, DO  DATE OF DISCHARGE:  PRIMARY CARE PHYSICIAN: Rafael Bihari, MD    ADMISSION DIAGNOSIS:  Left leg pain [M79.605]  DISCHARGE DIAGNOSIS:  Left posterior thigh muscle strain status post fall.   SECONDARY DIAGNOSIS:   Past Medical History:  Diagnosis Date  . Diabetes mellitus without complication [facility])     HOSPITAL COURSE:   83 y.o.malewith a history ofdiabetes, hyperlipidemia, hypothyroidism, osteoarthritis the right hip, CK D stage [name] and obstructive sleep apneanow being admitted with: 1. Intractable pain left thigh pains/p mechanical fall.  -There is no evidence of any tenderness or bony injury. - Pain control - PT eval Recommends rehabilitation  -social worker consulted -Added muscle relaxants  2. Hypotension - Gentle IVFs Resolved  3. DM - Continue home medsincluding Humalog, Lantus, quinapril  4. Hyperlipidemia -Continue Zocor  5. History of hypothyroid -Continue Synthroid  6. History of BPH -Continue Flomax  7. History of osteoarthritis of the hip -Continue Ultram -Recommend discontinuing Aleve given history of chronic kidney disease  8. CK D stage [name] -Creatinine near baseline  Discharge to rehabilitation if insurance approval obtained  CONSULTS OBTAINED:    DRUG ALLERGIES:  No Known Allergies  DISCHARGE MEDICATIONS:   Current Discharge Medication List    START taking these medications   Details  lidocaine (LIDODERM) 5 % Place 1 patch onto the skin daily. Remove & Discard patch within 12 hours or as directed by MD Qty: 30 patch, Refills: 0    methocarbamol (ROBAXIN) 500 MG tablet Take 1 tablet (500 mg total) by mouth every 8 (eight) hours. Qty: 30 tablet, Refills: 0    oxyCODONE-acetaminophen (ROXICET) 5-325  MG tablet Take 1-2 tablets by mouth every 6 (six) hours as needed. Qty: 20 tablet, Refills: 0      CONTINUE these medications which have NOT CHANGED   Details  co-enzyme Q-10 30 MG capsule Take 30 mg by mouth daily.    HUMALOG KWIKPEN 100 UNIT/ML KiwkPen Inject 25-30 Units into the skin 2 (two) times daily. 25 units with lunch and 30 units at supper.    LANTUS SOLOSTAR 100 UNIT/ML Solostar Pen Inject 45 Units into the skin daily at 10 pm.     pyridoxine (B-6) 100 MG tablet Take 100 mg by mouth daily.    quinapril (ACCUPRIL) 20 MG tablet Take 1 tablet by mouth daily.    simvastatin (ZOCOR) 40 MG tablet Take 1 tablet by mouth daily.    SYNTHROID 50 MCG tablet Take 1 tablet by mouth daily.    tamsulosin (FLOMAX) 0.4 MG CAPS capsule Take 1 capsule by mouth daily.    traMADol (ULTRAM) 50 MG tablet Take 50 mg by mouth every 6 (six) hours as needed.    loperamide (IMODIUM) 2 MG capsule Take 2 capsules (4 mg total) by mouth once. Qty: 15 capsule, Refills: 0    ondansetron (ZOFRAN ODT) 4 MG disintegrating tablet Take 1 tablet (4 mg total) by mouth every 8 (eight) hours as needed for nausea or vomiting. Qty: 20 tablet, Refills: 0        If you experience worsening of your admission symptoms, develop shortness of breath, life threatening emergency, suicidal or homicidal thoughts you must seek medical attention immediately by calling 911 or calling your MD immediately  if symptoms less  severe.  You Must read complete instructions/literature along with all the possible adverse reactions/side effects for all the Medicines you take and that have been prescribed to you. Take any new Medicines after you have completely understood and accept all the possible adverse reactions/side effects.   Please note  You were cared for by a hospitalist during your hospital stay. If you have any questions about your discharge medications or the care you received while you were in the hospital after you are  discharged, you can call the unit and asked to speak with the hospitalist on call if the hospitalist that took care of you is not available. Once you are discharged, your primary care physician will handle any further medical issues. Please note that NO REFILLS for any discharge medications will be authorized once you are discharged, as it is imperative that you return to your primary care physician (or establish a relationship with a primary care physician if you do not have one) for your aftercare needs so that they can reassess your need for medications and monitor your lab values. Today   SUBJECTIVE  Doing ok Pain improving. Working with PT  VITAL SIGNS:  Blood pressure (!) 147/67, pulse 60, temperature 98.2 F (36.8 C), temperature source Oral, resp. rate 19, height 5\' 8"  (1.727 m), weight 126.1 kg (277 lb 14.4 oz), SpO2 97 %.  I/O:   Intake/Output Summary (Last 24 hours) at 10/20/16 0744 Last data filed at 10/20/16 0345  Gross per 24 hour  Intake              840 ml  Output             1725 ml  Net             -885 ml    PHYSICAL EXAMINATION:  GENERAL:  83 y.o.-year-old patient lying in the bed with no acute distress.  EYES: Pupils equal, round, reactive to light and accommodation. No scleral icterus. Extraocular muscles intact.  HEENT: Head atraumatic, normocephalic. Oropharynx and nasopharynx clear.  NECK:  Supple, no jugular venous distention. No thyroid enlargement, no tenderness.  LUNGS: Normal breath sounds bilaterally, no wheezing, rales,rhonchi or crepitation. No use of accessory muscles of respiration.  CARDIOVASCULAR: S1, S2 normal. No murmurs, rubs, or gallops.  ABDOMEN: Soft, non-tender, non-distended. Bowel sounds present. No organomegaly or mass.  EXTREMITIES: No pedal edema, cyanosis, or clubbing.  NEUROLOGIC: Cranial nerves II through XII are intact. Muscle strength 5/5 in all extremities. Sensation intact. Gait not checked.  PSYCHIATRIC: The patient is alert and  oriented x 3.  SKIN: No obvious rash, lesion, or ulcer.   DATA REVIEW:   CBC   Recent Labs Lab 10/18/16 0344  WBC 9.2  HGB 13.2  HCT 38.9*  PLT 153    Chemistries   Recent Labs Lab 10/17/16 1942 10/20/16 0417  NA 134*  --   K 4.2  --   CL 102  --   CO2 23  --   GLUCOSE 177*  --   BUN 25*  --   CREATININE 1.60* 1.37*  CALCIUM 9.2  --     Microbiology Results   No results found for this or any previous visit (from the past 240 hour(s)).  RADIOLOGY:  No results found.   Management plans discussed with the patient, family and they are in agreement.  CODE STATUS:     Code Status Orders        Start     Ordered  10/18/16 0015  Full code  Continuous     10/18/16 0015    Code Status History    Date Active Date Inactive Code Status Order ID Comments User Context   This patient has a current code status but no historical code status.    Advance Directive Documentation   Flowsheet Row Most Recent Value  Type of Advance Directive  Living will, Healthcare Power of Attorney  Pre-existing out of facility DNR order (yellow form or pink MOST form)  No data  "MOST" Form in Place?  No data      TOTAL TIME TAKING CARE OF THIS PATIENT ;40 minutes.    Tamantha Saline M.D on 10/20/2016 at 7:44 AM  Between 7am to 6pm - Pager - [phone] After 6pm go to [email or web address] - password [email or web address] [facility]  [facility]  Office  [phone]  CC: Primary care physician; Rafael BihariWALKER [name], Cory B, MD

## 2016-10-20 NOTE — Care Management (Signed)
RNCM consult received for DME. Plan for patient to go to SNF- SNF can assist patient with DME when patient discharges. CSW will follow for now. Please contact RNCM if status changes.

## 2016-10-20 NOTE — Discharge Planning (Signed)
Patient IV removed. DC papers given, explained and educated.  Report given to Tamala SerAngela Walker, [facility] at [facility].  Pain scripts and packet sent with patient.  EMS contacted to transport.  Family informed discharge.  RN assessment and VS revealed stability for DC to SNIFF.  Waiting on EMS to arrive.

## 2016-10-20 NOTE — Clinical Social Work Placement (Signed)
   CLINICAL SOCIAL WORK PLACEMENT  NOTE  Date:  10/20/2016  Patient Details  Name: Cory [name] MRN: [id number] Date of Birth: 01/19/1931  Clinical Social Work is seeking post-discharge placement for this patient at the Skilled  Nursing Facility level of care (*CSW will initial, date and re-position this form in  chart as items are completed):  Yes   Patient/family provided with [place] Clinical Social Work Department's list of facilities offering this level of care within the geographic area requested by the patient (or if unable, by the patient's family).  Yes   Patient/family informed of their freedom to choose among providers that offer the needed level of care, that participate in Medicare, Medicaid or managed care program needed by the patient, have an available bed and are willing to accept the patient.      Patient/family informed of [facility]'s ownership interest in [facility] and [facility], as well as of the fact that they are under no obligation to receive care at these facilities.  PASRR submitted to EDS on       PASRR number received on       Existing PASRR number confirmed on 10/18/16     FL2 transmitted to all facilities in geographic area requested by pt/family on 10/18/16     FL2 transmitted to all facilities within larger geographic area on       Patient informed that his/her managed care company has contracts with or will negotiate with certain facilities, including the following:        Yes   Patient/family informed of bed offers received.  Patient chooses bed at  [facility] )     Physician recommends and patient chooses bed at      Patient to be transferred to  [facility] ) on 10/20/16.  Patient [facility] facility [facility]     Patient family notified on 10/20/16 of transfer.  Name of family member notified:   (Patient's wife [name] is aware of D/C today. )     PHYSICIAN       Additional  Comment:    _______________________________________________ Raymonda Pell, Darleen CrockerBailey M, LCSW 10/20/2016, 2:01 PM

## 2016-10-20 NOTE — Progress Notes (Signed)
Clinical Child psychotherapistocial Worker (CSW) presented bed offers to patient and he chose [facility]. [facility] will [facility] to room [facility]. RN will call report and arrange EMS for transport. CSW [facility] via [facility]. Patient is aware of above. CSW contacted patient's wife [name] and made her aware of above. Please reconsult if future social work needs arise. CSW signing off.   Baker Hughes IncorporatedBailey Mertis Mosher, LCSW [phone]

## 2016-10-20 NOTE — Progress Notes (Signed)
Shift assessment completed at 0815. Pt resting in bed, stating that he is unable to walk and will be bedbound due to a pulled hamstring, pt stating he wants to go to rehab. This Clinical research associatewriter explained that rehab depends on his insurance and that Child psychotherapistsocial worker will discuss this with him, but that normally patients with his admission classification are unable to go to rehab. Pt does not feel he can be expected to go home. Pt c/o pain to his L hip and leg with movement, no bruising or swelling noted. Assessment is benign. Report given to [facility]n at 1115.

## 2018-07-27 ENCOUNTER — Emergency Department: Payer: Medicare Other

## 2018-07-27 ENCOUNTER — Emergency Department
Admission: EM | Admit: 2018-07-27 | Discharge: 2018-07-27 | Disposition: A | Discharge status: Home / Self Care | Payer: Medicare Other | Attending: Emergency Medicine | Admitting: Emergency Medicine

## 2018-07-27 ENCOUNTER — Other Ambulatory Visit: Payer: Self-pay

## 2018-07-27 DIAGNOSIS — Y999 Unspecified external cause status: Secondary | ICD-10-CM | POA: Diagnosis not present

## 2018-07-27 DIAGNOSIS — Y929 Unspecified place or not applicable: Secondary | ICD-10-CM | POA: Insufficient documentation

## 2018-07-27 DIAGNOSIS — E119 Type 2 diabetes mellitus without complications: Secondary | ICD-10-CM | POA: Diagnosis not present

## 2018-07-27 DIAGNOSIS — X58XXXA Exposure to other specified factors, initial encounter: Secondary | ICD-10-CM | POA: Diagnosis not present

## 2018-07-27 DIAGNOSIS — S86812A Strain of other muscle(s) and tendon(s) at lower leg level, left leg, initial encounter: Secondary | ICD-10-CM | POA: Insufficient documentation

## 2018-07-27 DIAGNOSIS — Y939 Activity, unspecified: Secondary | ICD-10-CM | POA: Diagnosis not present

## 2018-07-27 DIAGNOSIS — Z79899 Other long term (current) drug therapy: Secondary | ICD-10-CM | POA: Insufficient documentation

## 2018-07-27 DIAGNOSIS — M79662 Pain in left lower leg: Secondary | ICD-10-CM | POA: Diagnosis not present

## 2018-07-27 DIAGNOSIS — S8992XA Unspecified injury of left lower leg, initial encounter: Secondary | ICD-10-CM | POA: Diagnosis present

## 2018-07-27 DIAGNOSIS — S86112A Strain of other muscle(s) and tendon(s) of posterior muscle group at lower leg level, left leg, initial encounter: Secondary | ICD-10-CM

## 2018-07-27 DIAGNOSIS — Z794 Long term (current) use of insulin: Secondary | ICD-10-CM | POA: Diagnosis not present

## 2018-07-27 MED ORDER — OXYCODONE-ACETAMINOPHEN 5-325 MG PO TABS
1.0000 | ORAL_TABLET | Freq: Once | ORAL | Status: AC
  Administered 2018-07-27: 1 via ORAL
  Filled 2018-07-27: qty 1

## 2018-07-27 MED ORDER — DICLOFENAC SODIUM 1 % TD GEL: 4.0000 g | Freq: Four times a day (QID) | TRANSDERMAL | 0 refills | Status: DC | PRN

## 2018-07-27 NOTE — ED Triage Notes (Signed)
Reports today with bilateral pain below knee, pain resolved after about an hour in right leg.  Left leg had continued to hurt in calf area.

## 2018-07-27 NOTE — ED Provider Notes (Signed)
[facility] Emergency Department Provider Note  ____________________________________________   First MD Initiated Contact with Patient 07/27/18 0335     (approximate)  I have reviewed the triage vital signs and the nursing notes.   HISTORY  Chief Complaint Leg Pain   HPI Cory [name] is a 84 y.o. male who self presents to the emergency department with pain to his proximal medial left calf that began earlier today.  Symptoms seem to come on gradually were slowly progressive and at their worst were moderate severity.  He denies any swelling to his leg.  No trauma.  No difficulty ambulating.  No history of the same.  No fevers or chills.  The pain is described as throbbing and aching.  Nonradiating.    Past Medical History:  Diagnosis Date  . Diabetes mellitus without complication [facility])     Patient Active Problem List   Diagnosis Date Noted  . Intractable pain 10/17/2016    No past surgical history on file.  Prior to Admission medications   Medication Sig Start Date End Date Taking? Authorizing Provider  co-enzyme Q-10 30 MG capsule Take 30 mg by mouth daily.    [provider]  diclofenac sodium (VOLTAREN) 1 % GEL Apply 4 g topically 4 (four) times daily as needed (pain). 07/27/18   Merrily Brittle, MD  HUMALOG KWIKPEN 100 UNIT/ML KiwkPen Inject 25-30 Units into the skin 2 (two) times daily. 25 units with lunch and 30 units at supper. 08/18/16   [provider]  LANTUS SOLOSTAR 100 UNIT/ML Solostar Pen Inject 45 Units into the skin daily at 10 pm.  08/18/16   [provider]  lidocaine (LIDODERM) 5 % Place 1 patch onto the skin daily. Remove & Discard patch within 12 hours or as directed by MD 10/20/16   Enedina Finner, MD  loperamide (IMODIUM) 2 MG capsule Take 2 capsules (4 mg total) by mouth once. Patient not taking: Reported on 10/17/2016 09/03/15   Rockne Menghini, MD  methocarbamol (ROBAXIN) 500 MG tablet Take 1 tablet  (500 mg total) by mouth every 8 (eight) hours. 10/20/16   Enedina Finner, MD  ondansetron (ZOFRAN ODT) 4 MG disintegrating tablet Take 1 tablet (4 mg total) by mouth every 8 (eight) hours as needed for nausea or vomiting. Patient not taking: Reported on 10/17/2016 09/03/15   Rockne Menghini, MD  pyridoxine (B-6) 100 MG tablet Take 100 mg by mouth daily.    [provider]  quinapril (ACCUPRIL) 20 MG tablet Take 1 tablet by mouth daily. 09/21/16   [provider]  simvastatin (ZOCOR) 40 MG tablet Take 1 tablet by mouth daily. 09/26/16   [provider]  SYNTHROID 50 MCG tablet Take 1 tablet by mouth daily. 09/26/16   [provider]  tamsulosin (FLOMAX) 0.4 MG CAPS capsule Take 1 capsule by mouth daily. 07/29/16   [provider]  traMADol (ULTRAM) 50 MG tablet Take 50 mg by mouth every 6 (six) hours as needed.    [provider]    Allergies Patient has no known allergies.  No family history on file.  Social History Social History   Tobacco Use  . Smoking status: Never Smoker  . Smokeless tobacco: Never Used  Substance Use Topics  . Alcohol use: Yes  . Drug use: Not on file    Review of Systems Constitutional: No fever/chills Cardiovascular: Denies chest pain. Respiratory: Denies shortness of breath. Gastrointestinal: No abdominal pain.  No nausea, no vomiting.  Musculoskeletal: Positive for calf pain Neurological: Negative for headaches, focal weakness or numbness.   ____________________________________________   PHYSICAL EXAM:  VITAL SIGNS: ED Triage Vitals  Enc Vitals Group     BP 07/27/18 0019 (!) 141/68     Pulse Rate 07/27/18 0019 67     Resp 07/27/18 0019 20     Temp 07/27/18 0019 97.6 F (36.4 C)     Temp Source 07/27/18 0019 Oral     SpO2 07/27/18 0019 93 %     Weight 07/27/18 0017 172 lb (78 kg)     Height 07/27/18 0017 5\' 8"  (1.727 m)     Head Circumference --      Peak Flow --      Pain Score  07/27/18 0017 6     Pain Loc --      Pain Edu? --      Excl. in GC? --     Constitutional: Alert and oriented x4 pleasant cooperative speaks in full clear sentences no diaphoresis Head: Atraumatic. Cardiovascular: Normal rate, regular rhythm. Grossly normal heart sounds.  Good peripheral circulation. Respiratory: Normal respiratory effort.  No retractions. Lungs CTAB and moving good air Gastrointestinal: Obese nontender Musculoskeletal: Legs are equal in size.  No edema appreciated.  Left knee no effusion extensor mechanism intact knee grossly stable.  He is quite tender over the proximal medial head of his gastrocnemius on the left Neurologic:  Normal speech and language. No gross focal neurologic deficits are appreciated. Skin:  Skin is warm, dry and intact. No rash noted. Psychiatric: Mood and affect are normal. Speech and behavior are normal.    ____________________________________________   DIFFERENTIAL includes but not limited to  DVT, Baker's cyst, septic joint, bursitis, muscle strain ____________________________________________   LABS (all labs ordered are listed, but only abnormal results are displayed)  Labs Reviewed - No data to display   __________________________________________  EKG   ____________________________________________  RADIOLOGY  Ultrasound of the left leg reviewed by me with no acute disease ____________________________________________   PROCEDURES  Procedure(s) performed: no  Procedures  Critical Care performed: no  ____________________________________________   INITIAL IMPRESSION / ASSESSMENT AND PLAN / ED COURSE  Pertinent labs & imaging results that were available during my care of the patient were reviewed by me and considered in my medical decision making (see chart for details).   As part of my medical decision making, I reviewed the following data within the electronic MEDICAL RECORD NUMBER History obtained from family if  available, nursing notes, old chart and ekg, as well as notes from prior ED visits.  The patient's symptoms are most consistent with strain of the medial head of his gastrocnemius versus tendinitis on the left.  Ultrasound is reassuring and the patient is able to bear weight without difficulty.  Ace wrap placed with improvement in his symptoms.  The mainstay of therapy is nonsteroidals but at his age I am not really comfortable giving him systemic nonsteroidals given the high risk of renal failure or GI bleeding.  Instead I will prescribe Voltaren gel for the localized effects and refer him to orthopedic surgery as an outpatient.  Strict return precautions have been given.      ____________________________________________   FINAL CLINICAL IMPRESSION(S) / ED DIAGNOSES  Final diagnoses:  Gastrocnemius muscle strain, left, initial encounter      NEW MEDICATIONS STARTED DURING THIS VISIT:  Discharge Medication List as of 07/27/2018  4:47 AM    START taking these medications   Details  diclofenac sodium (VOLTAREN) 1 % GEL Apply 4 g topically 4 (four) times daily as needed (pain)., Starting Tue 07/27/2018, Print         Note:  This document was prepared using Dragon voice recognition software and may include unintentional dictation errors.     Merrily Brittle, MD 07/27/18 1017

## 2018-07-27 NOTE — ED Notes (Addendum)
Pt uprite on stretcher in exam room with no distress noted; reports left lower leg pain since last night; denies any accomp symptoms; +PP, brisk cap refill, W&D, good movem/sensation, -edema

## 2018-07-27 NOTE — ED Notes (Signed)
Ace wrap applied to left lower leg as ordered; +PP, brisk cap refill, W&D, good movem/sensation

## 2018-07-27 NOTE — Discharge Instructions (Signed)
It was a pleasure to take care of you today, and thank you for coming to our emergency department.  If you have any questions or concerns before leaving please ask the nurse to grab me and I'm more than happy to go through your aftercare instructions again.  If you were prescribed any opioid pain medication today such as Norco, Vicodin, Percocet, morphine, hydrocodone, or oxycodone please make sure you do not drive when you are taking this medication as it can alter your ability to drive safely.  If you have any concerns once you are home that you are not improving or are in fact getting worse before you can make it to your follow-up appointment, please do not hesitate to call [phone] and come back for further evaluation.  Merrily Brittle, MD  Results for orders placed or performed during the hospital encounter of 10/17/16  Basic metabolic panel  Result Value Ref Range   Sodium 134 (L) 135 - 145 mmol/L   Potassium 4.2 3.5 - 5.1 mmol/L   Chloride 102 101 - 111 mmol/L   CO2 23 22 - 32 mmol/L   Glucose, Bld 177 (H) 65 - 99 mg/dL   BUN 25 (H) 6 - 20 mg/dL   Creatinine, Ser 4.13 (H) 0.61 - 1.24 mg/dL   Calcium 9.2 8.9 - 24.4 mg/dL   GFR calc non Af Amer 38 (L) >60 mL/min   GFR calc Af Amer 44 (L) >60 mL/min   Anion gap 9 5 - 15  CBC  Result Value Ref Range   WBC 9.2 3.8 - 10.6 K/uL   RBC 4.25 (L) 4.40 - 5.90 MIL/uL   Hemoglobin 13.2 13.0 - 18.0 g/dL   HCT 01.0 (L) 27.2 - 53.6 %   MCV 91.6 80.0 - 100.0 fL   MCH 31.2 26.0 - 34.0 pg   MCHC 34.0 32.0 - 36.0 g/dL   RDW 64.4 (H) 03.4 - 74.2 %   Platelets 153 150 - 440 K/uL  Glucose, capillary  Result Value Ref Range   Glucose-Capillary 179 (H) 65 - 99 mg/dL   Comment 1 Notify RN   Glucose, capillary  Result Value Ref Range   Glucose-Capillary 185 (H) 65 - 99 mg/dL   Comment 1 Notify RN   Glucose, capillary  Result Value Ref Range   Glucose-Capillary 106 (H) 65 - 99 mg/dL   Comment 1 Notify RN   Glucose, capillary  Result Value Ref Range    Glucose-Capillary 127 (H) 65 - 99 mg/dL   Comment 1 Notify RN   Glucose, capillary  Result Value Ref Range   Glucose-Capillary 137 (H) 65 - 99 mg/dL   Comment 1 Notify RN    Comment 2 Document in Chart   Glucose, capillary  Result Value Ref Range   Glucose-Capillary 172 (H) 65 - 99 mg/dL   Comment 1 Notify RN   Glucose, capillary  Result Value Ref Range   Glucose-Capillary 199 (H) 65 - 99 mg/dL   Comment 1 Notify RN   Creatinine, serum  Result Value Ref Range   Creatinine, Ser 1.37 (H) 0.61 - 1.24 mg/dL   GFR calc non Af Amer 46 (L) >60 mL/min   GFR calc Af Amer 53 (L) >60 mL/min  Glucose, capillary  Result Value Ref Range   Glucose-Capillary 140 (H) 65 - 99 mg/dL   Comment 1 Notify RN   Glucose, capillary  Result Value Ref Range   Glucose-Capillary 275 (H) 65 - 99 mg/dL   US Venous  Img Lower Unilateral Left  Result Date: 07/27/2018 CLINICAL DATA:  84 year old male with left calf pain. EXAM: Left LOWER EXTREMITY VENOUS DOPPLER ULTRASOUND TECHNIQUE: Gray-scale sonography with graded compression, as well as color Doppler and duplex ultrasound were performed to evaluate the lower extremity deep venous systems from the level of the common femoral vein and including the common femoral, femoral, profunda femoral, popliteal and calf veins including the posterior tibial, peroneal and gastrocnemius veins when visible. The superficial great saphenous vein was also interrogated. Spectral Doppler was utilized to evaluate flow at rest and with distal augmentation maneuvers in the common femoral, femoral and popliteal veins. COMPARISON:  None. FINDINGS: Contralateral Common Femoral Vein: Respiratory phasicity is normal and symmetric with the symptomatic side. No evidence of thrombus. Normal compressibility. Common Femoral Vein: No evidence of thrombus. Normal compressibility, respiratory phasicity and response to augmentation. Saphenofemoral Junction: No evidence of thrombus. Normal  compressibility and flow on color Doppler imaging. Profunda Femoral Vein: No evidence of thrombus. Normal compressibility and flow on color Doppler imaging. Femoral Vein: No evidence of thrombus. Normal compressibility, respiratory phasicity and response to augmentation. Popliteal Vein: No evidence of thrombus. Normal compressibility, respiratory phasicity and response to augmentation. Calf Veins: Unremarkable on limited visualization. Superficial Great Saphenous Vein: No evidence of thrombus. Normal compressibility. Venous Reflux:  None. Other Findings:  None. IMPRESSION: No evidence of deep venous thrombosis. Electronically Signed   By: Elgie Collard M.D.   On: 07/27/2018 04:01

## 2019-05-31 ENCOUNTER — Encounter: Payer: Self-pay | Admitting: Emergency Medicine

## 2019-05-31 ENCOUNTER — Emergency Department: Payer: Medicare Other

## 2019-05-31 ENCOUNTER — Emergency Department
Admission: EM | Admit: 2019-05-31 | Discharge: 2019-05-31 | Disposition: A | Discharge status: Home / Self Care | Payer: Medicare Other | Attending: Emergency Medicine | Admitting: Emergency Medicine

## 2019-05-31 ENCOUNTER — Other Ambulatory Visit: Payer: Self-pay

## 2019-05-31 DIAGNOSIS — E11649 Type 2 diabetes mellitus with hypoglycemia without coma: Secondary | ICD-10-CM | POA: Insufficient documentation

## 2019-05-31 DIAGNOSIS — Y9301 Activity, walking, marching and hiking: Secondary | ICD-10-CM | POA: Diagnosis not present

## 2019-05-31 DIAGNOSIS — E162 Hypoglycemia, unspecified: Secondary | ICD-10-CM

## 2019-05-31 DIAGNOSIS — S0181XA Laceration without foreign body of other part of head, initial encounter: Secondary | ICD-10-CM

## 2019-05-31 DIAGNOSIS — Z79899 Other long term (current) drug therapy: Secondary | ICD-10-CM | POA: Diagnosis not present

## 2019-05-31 DIAGNOSIS — R55 Syncope and collapse: Secondary | ICD-10-CM | POA: Diagnosis not present

## 2019-05-31 DIAGNOSIS — W19XXXA Unspecified fall, initial encounter: Secondary | ICD-10-CM | POA: Insufficient documentation

## 2019-05-31 DIAGNOSIS — Y998 Other external cause status: Secondary | ICD-10-CM | POA: Diagnosis not present

## 2019-05-31 DIAGNOSIS — S0121XA Laceration without foreign body of nose, initial encounter: Secondary | ICD-10-CM | POA: Insufficient documentation

## 2019-05-31 DIAGNOSIS — Y9289 Other specified places as the place of occurrence of the external cause: Secondary | ICD-10-CM | POA: Insufficient documentation

## 2019-05-31 LAB — CBC
HCT: 42 % (ref 39.0–52.0)
Hemoglobin: 13.5 g/dL (ref 13.0–17.0)
MCH: 30.4 pg (ref 26.0–34.0)
MCHC: 32.1 g/dL (ref 30.0–36.0)
MCV: 94.6 fL (ref 80.0–100.0)
Platelets: 206 10*3/uL (ref 150–400)
RBC: 4.44 MIL/uL (ref 4.22–5.81)
RDW: 13.6 % (ref 11.5–15.5)
WBC: 8.4 10*3/uL (ref 4.0–10.5)
nRBC: 0 % (ref 0.0–0.2)

## 2019-05-31 LAB — BASIC METABOLIC PANEL
Anion gap: 9 (ref 5–15)
BUN: 16 mg/dL (ref 8–23)
CO2: 24 mmol/L (ref 22–32)
Calcium: 8.9 mg/dL (ref 8.9–10.3)
Chloride: 99 mmol/L (ref 98–111)
Creatinine, Ser: 1.45 mg/dL — ABNORMAL HIGH (ref 0.61–1.24)
GFR calc Af Amer: 50 mL/min — ABNORMAL LOW (ref >60)
GFR calc non Af Amer: 43 mL/min — ABNORMAL LOW (ref >60)
Glucose, Bld: 243 mg/dL — ABNORMAL HIGH (ref 70–99)
Potassium: 4.7 mmol/L (ref 3.5–5.1)
Sodium: 132 mmol/L — ABNORMAL LOW (ref 135–145)

## 2019-05-31 LAB — GLUCOSE, CAPILLARY
Glucose-Capillary: 188 mg/dL — ABNORMAL HIGH (ref 70–99)
Glucose-Capillary: 202 mg/dL — ABNORMAL HIGH (ref 70–99)

## 2019-05-31 IMAGING — CT CT MAXILLOFACIAL W/O CM
3 of 8 series · 16 of 47 positions shown, 19 images · non-contrast
Comparison: None

CLINICAL DATA: Syncope with head trauma.

EXAM:
CT HEAD WITHOUT CONTRAST
CT MAXILLOFACIAL WITHOUT CONTRAST
TECHNIQUE: Multidetector CT imaging of the head and maxillofacial structures
were performed using the standard protocol without intravenous
contrast. Multiplanar CT image reconstructions of the maxillofacial
structures were also generated.

[Series 4: coronal soft tissue · coronal · 0.31mm/px · 3 of 61 slices shown]
[im 19/61  bone]
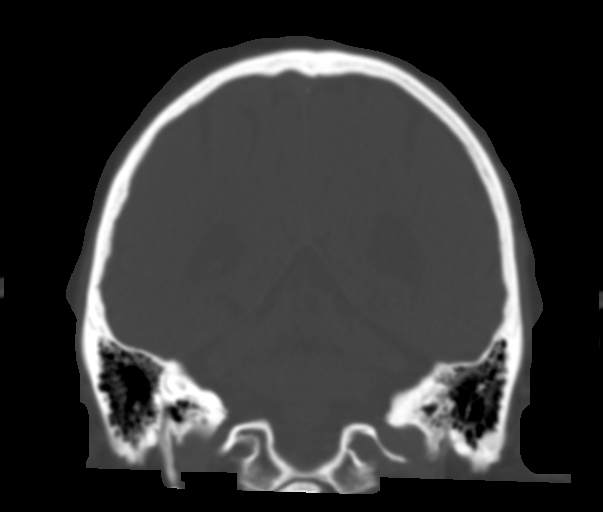
[im 37/61  bone]
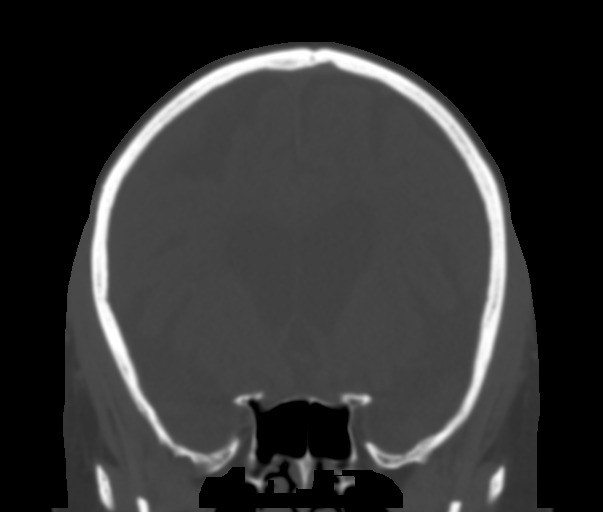
[im 55/61  bone]
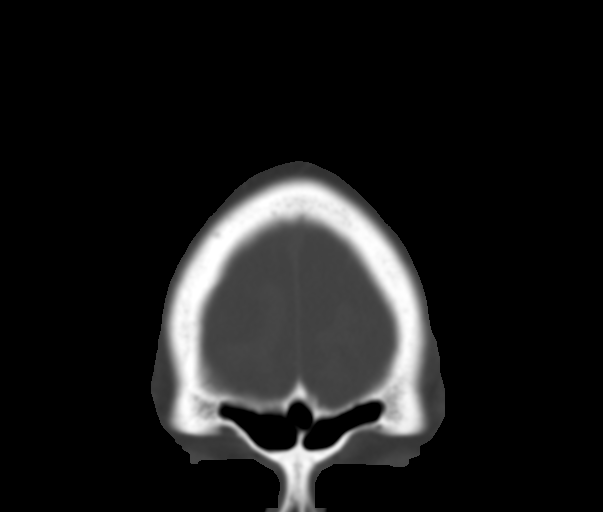

[Series 10: st thins (person_name) · axial · 0.35mm/px · z∈[-248,-89]mm · 11 of 353 slices shown, 14 images]
[im 18/353  brain]
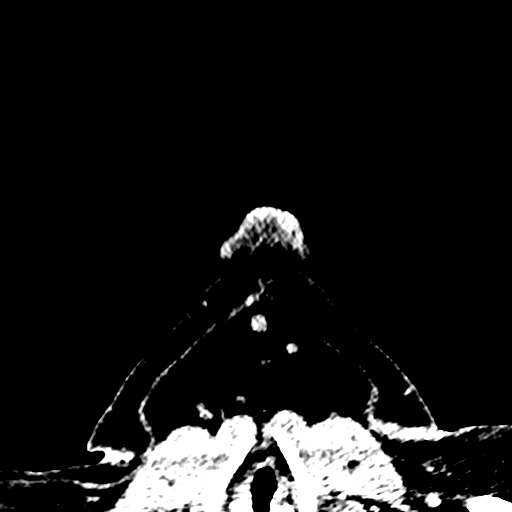
[im 18/353  bone]
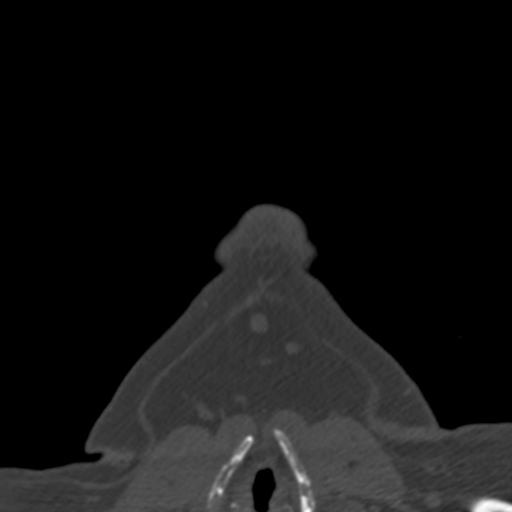
[im 53/353  bone]
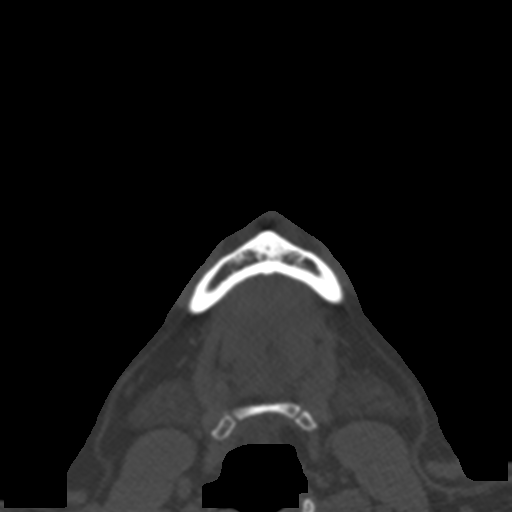
[im 89/353  bone]
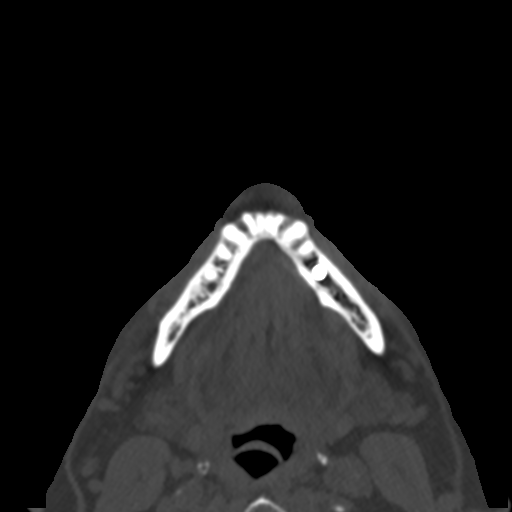
[im 124/353  bone]
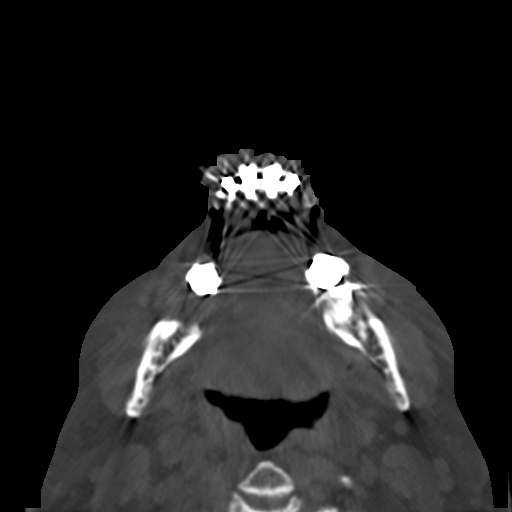
[im 141/353  brain]
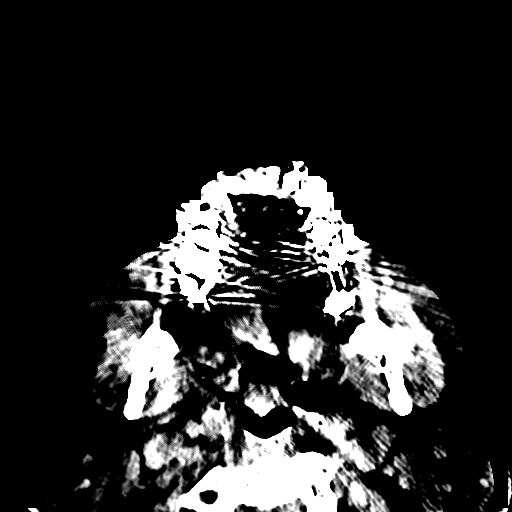
[im 141/353  bone]
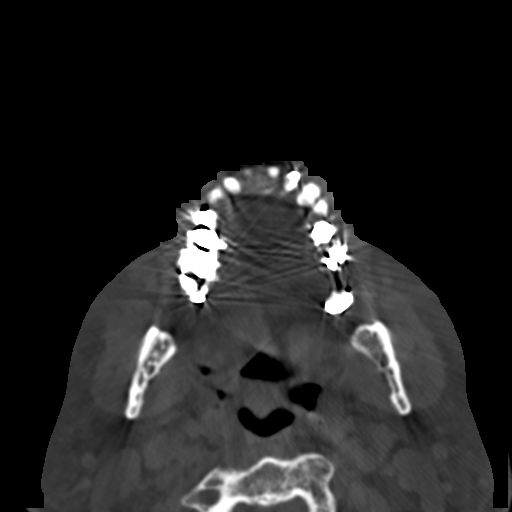
[im 177/353  bone]
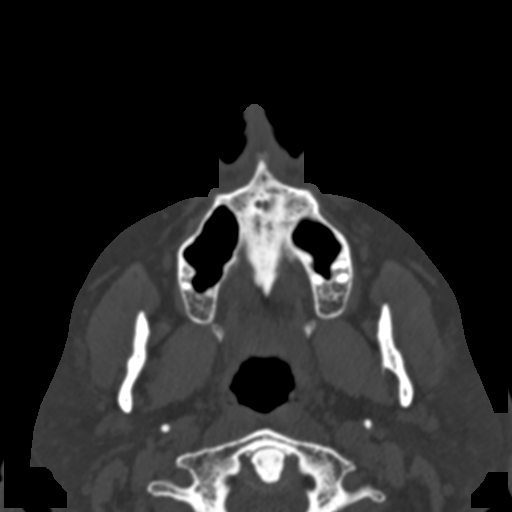
[im 212/353  bone]
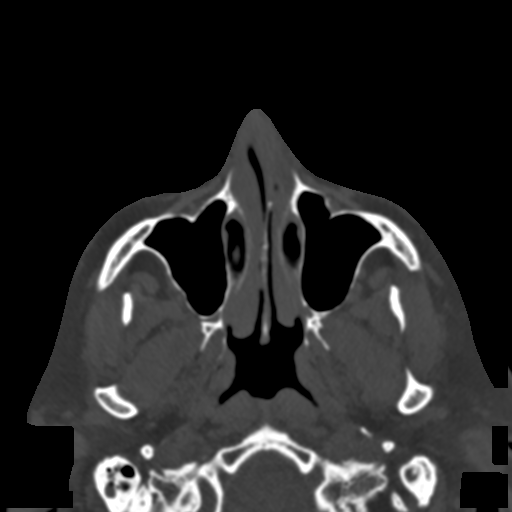
[im 229/353  bone]
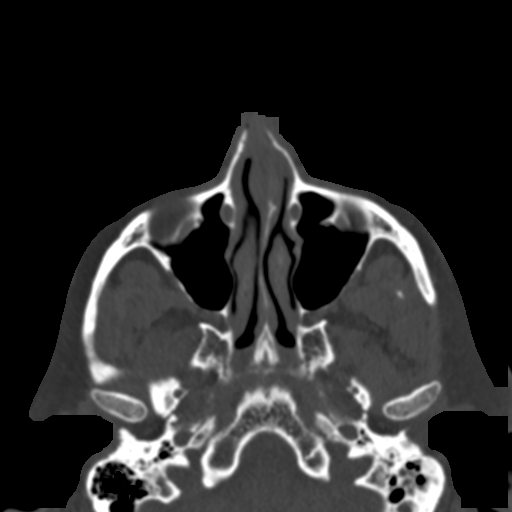
[im 265/353  brain]
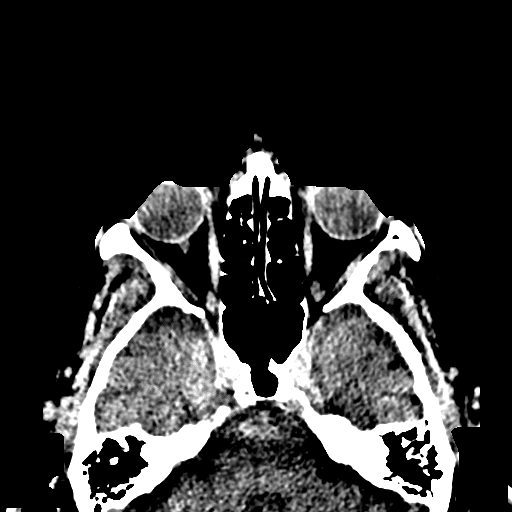
[im 265/353  bone]
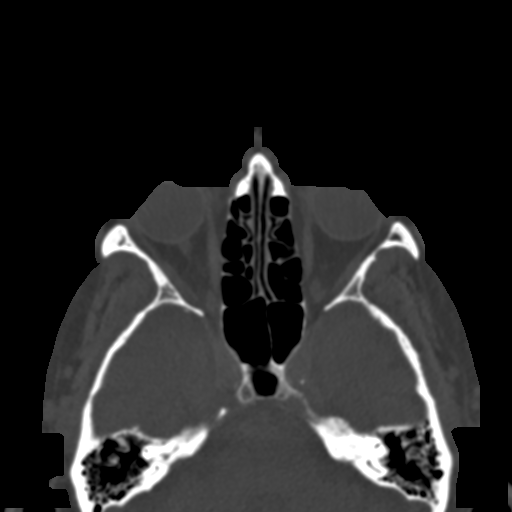
[im 300/353  bone]
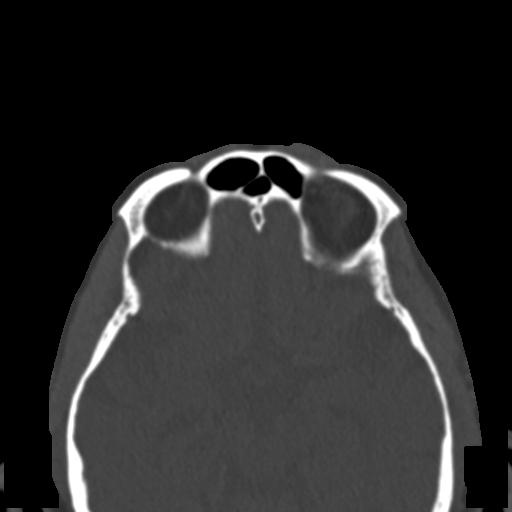
[im 335/353  bone]
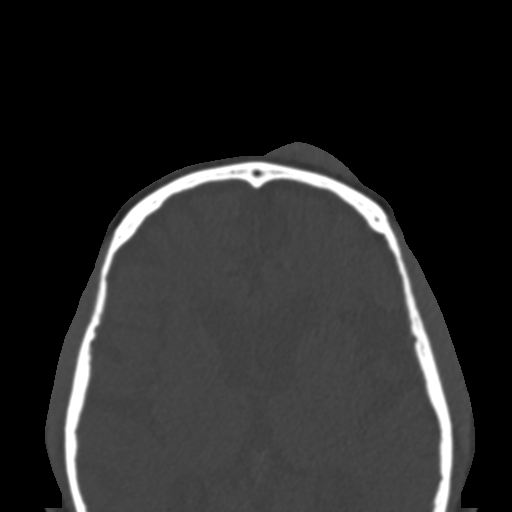

[Series 13: sagittal soft · sagittal · 0.36mm/px · 2 of 87 slices shown]
[im 29/87  bone]
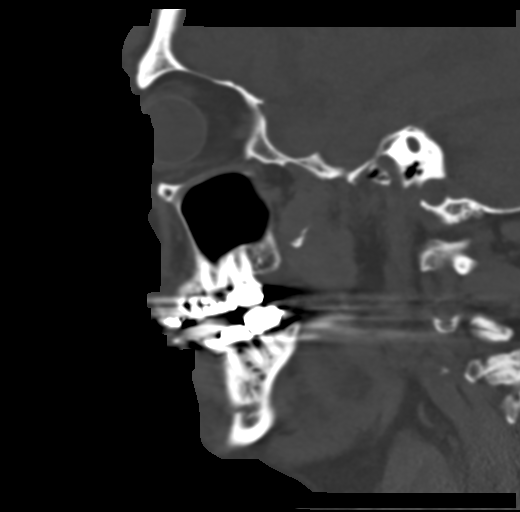
[im 58/87  bone]
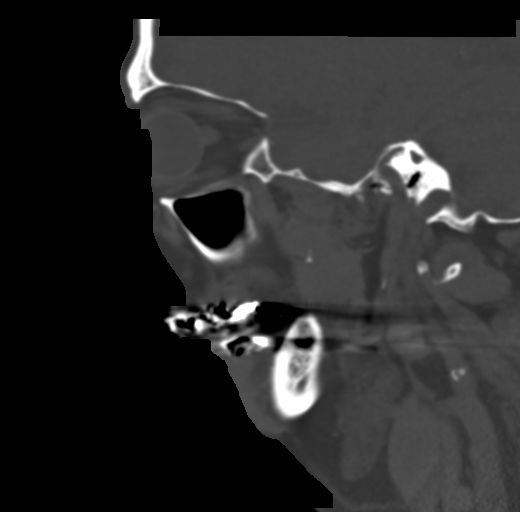

[16 of 47 positions shown; findings below may reference images not displayed]

FINDINGS: CT HEAD FINDINGS

Brain: There is no acute intracranial hemorrhage, infarction, or
mass lesion. There is diffuse cerebral cortical and cerebellar
atrophy with secondary ventricular dilatation.

Vascular: No hyperdense vessel or unexpected calcification.

Skull: Normal. Negative for fracture or focal lesion.

Other: There is a scalp hematoma over the left frontal bone.

CT MAXILLOFACIAL FINDINGS

Osseous: There is a fracture of nasal bone with slight deviation to
the right. The anterior aspect of the nasal septum is fractured.

Orbits: Intact.

Sinuses: Clear.

Soft tissues: There is a scalp hematoma over the left side of the
frontal bone overlying the left superior orbital rim.
IMPRESSION: 1. No acute intracranial abnormality.
2. Scalp hematoma over the left frontal bone.
3. Fracture of the left side of the nasal bone with slight
deviation. Fracture of the anterior aspect of the nasal septum.

## 2019-05-31 IMAGING — CT CT HEAD W/O CM
3 of 7 series · 16 of 47 positions shown, 19 images · non-contrast
Comparison: None

CLINICAL DATA: Syncope with head trauma.

EXAM:
CT HEAD WITHOUT CONTRAST
CT MAXILLOFACIAL WITHOUT CONTRAST
TECHNIQUE: Multidetector CT imaging of the head and maxillofacial structures
were performed using the standard protocol without intravenous
contrast. Multiplanar CT image reconstructions of the maxillofacial
structures were also generated.

[Series 4: coronal soft tissue · coronal · 0.31mm/px · 3 of 61 slices shown]
[im 19/61  brain]
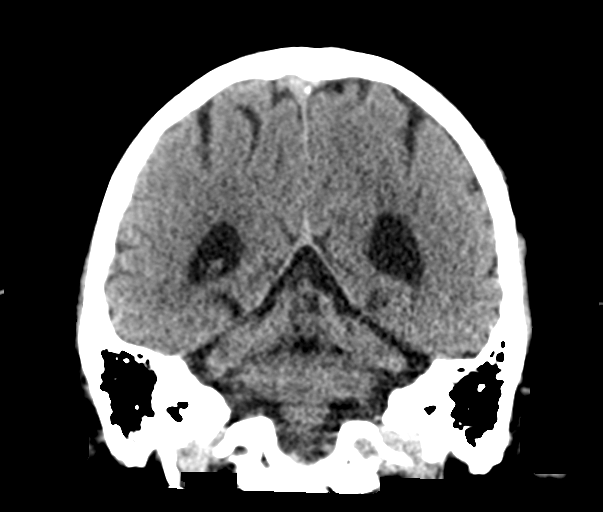
[im 37/61  brain]
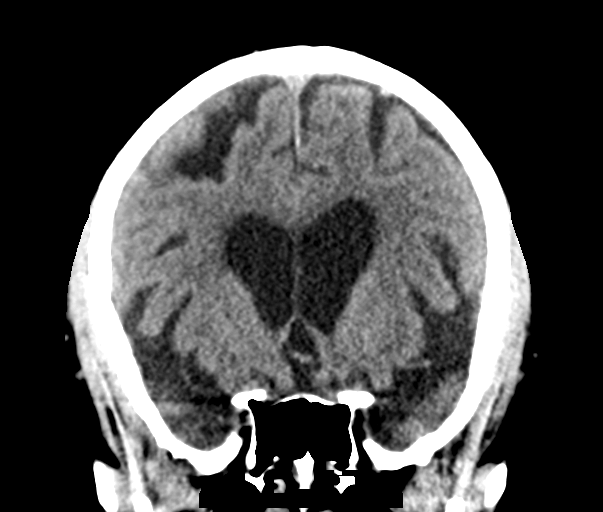
[im 55/61  brain]
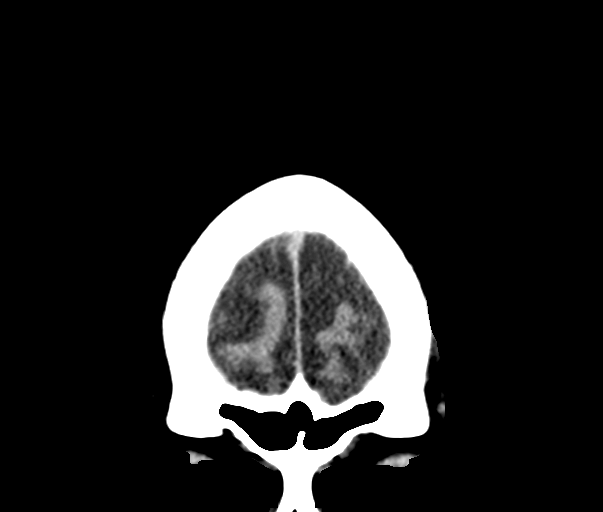

[Series 8: max soft (person_name) · axial · 0.35mm/px · z∈[-246,-92]mm · 11 of 89 slices shown, 14 images]
[im 6/89  brain]
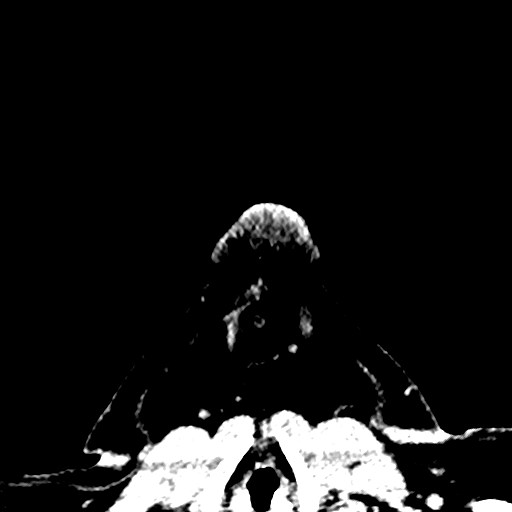
[im 6/89  bone]
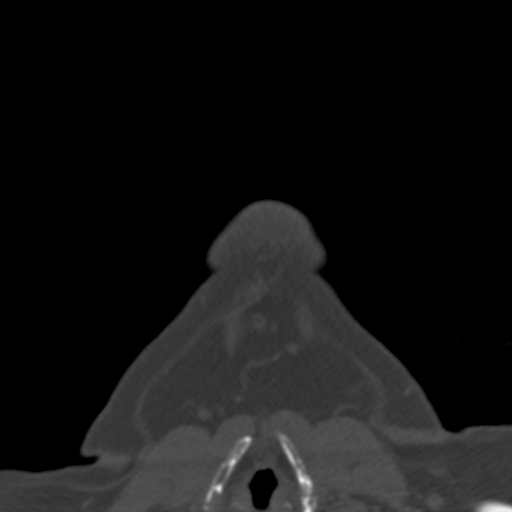
[im 16/89  brain]
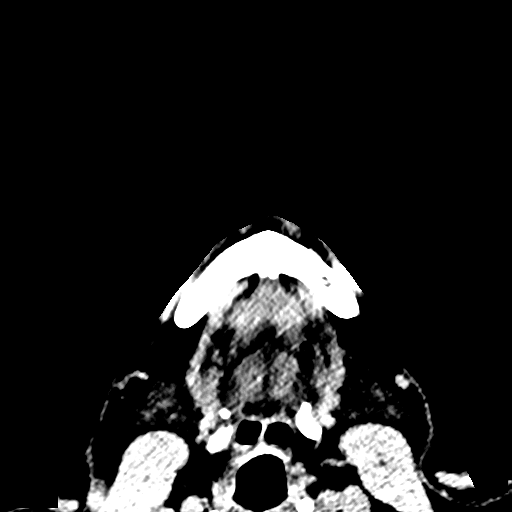
[im 21/89  brain]
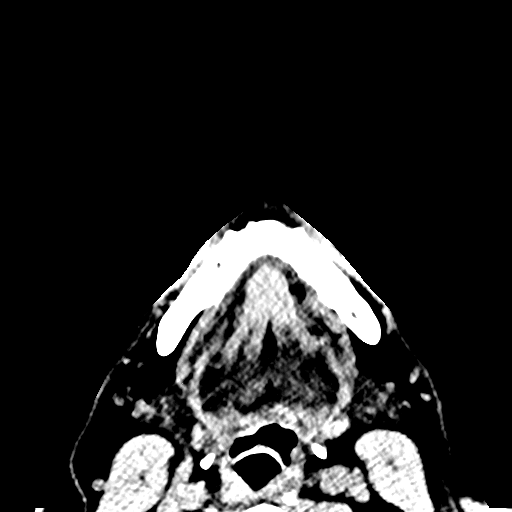
[im 32/89  brain]
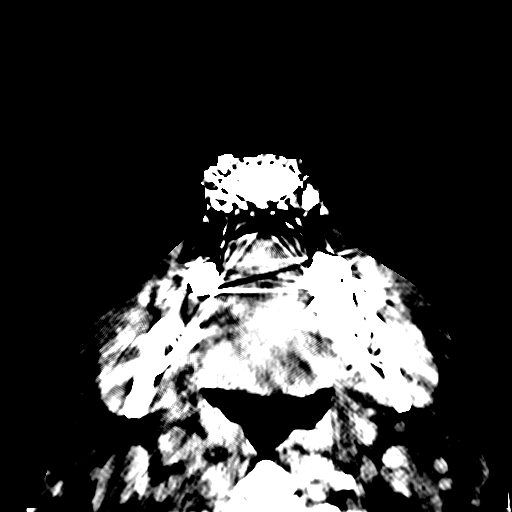
[im 37/89  brain]
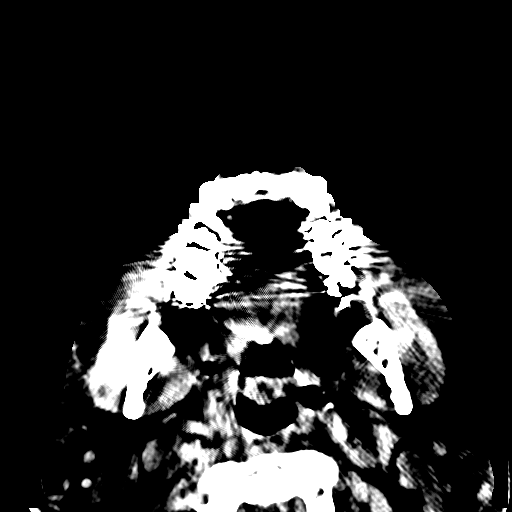
[im 37/89  bone]
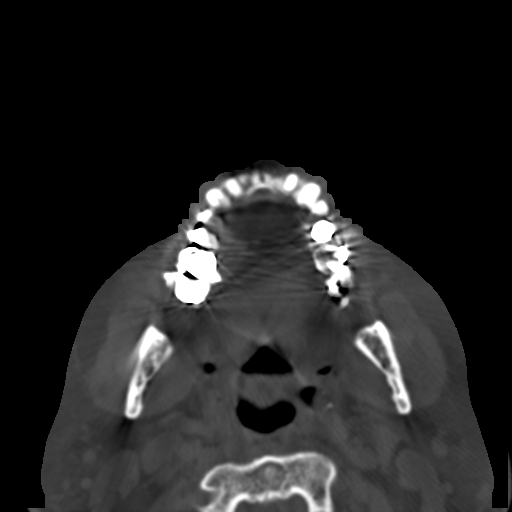
[im 47/89  brain]
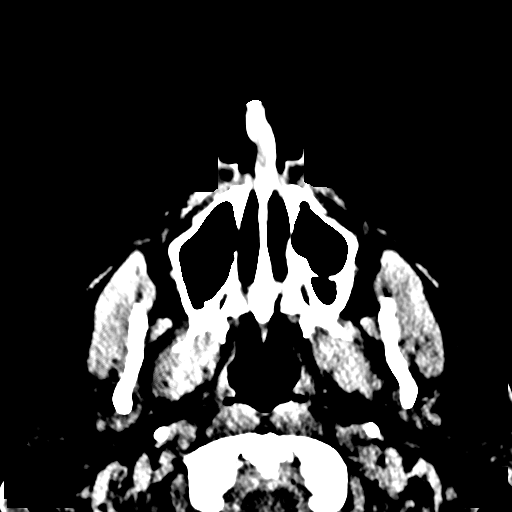
[im 52/89  brain]
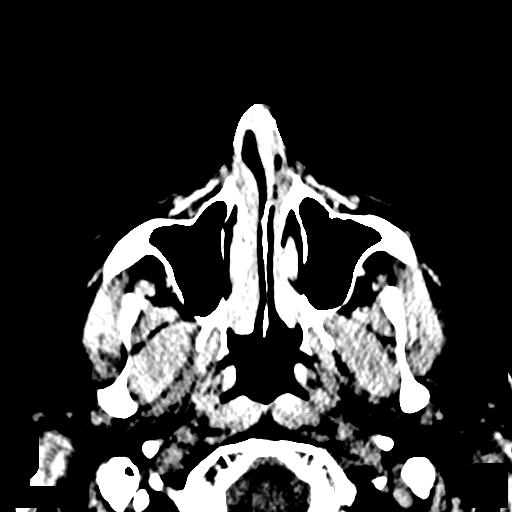
[im 57/89  brain]
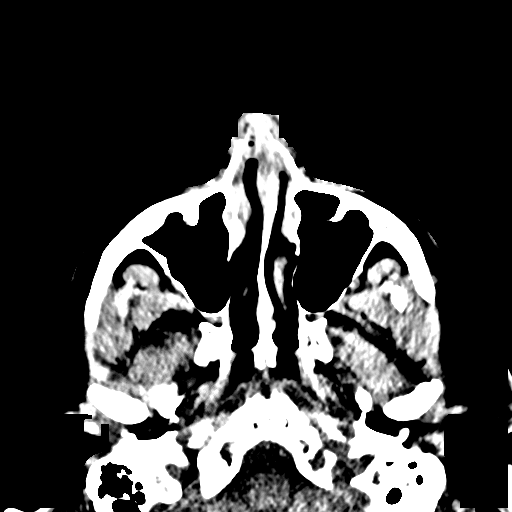
[im 68/89  brain]
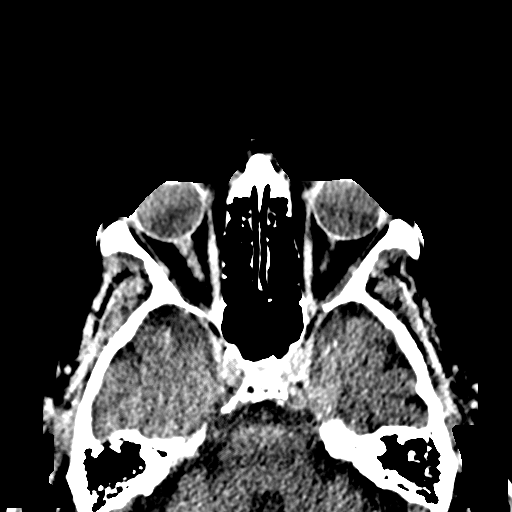
[im 68/89  bone]
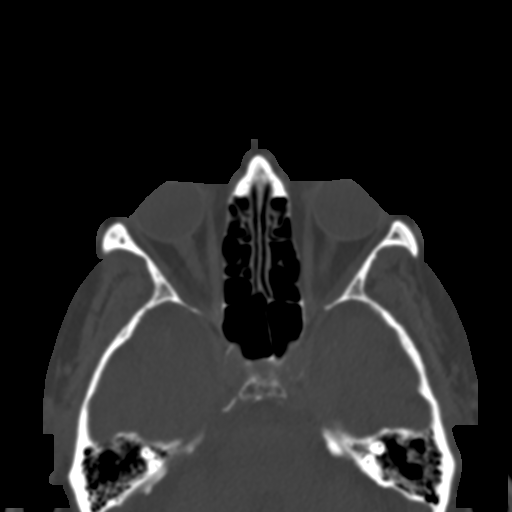
[im 73/89  brain]
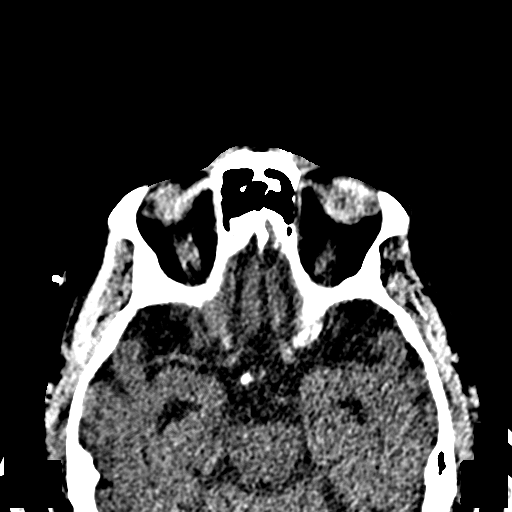
[im 83/89  brain]
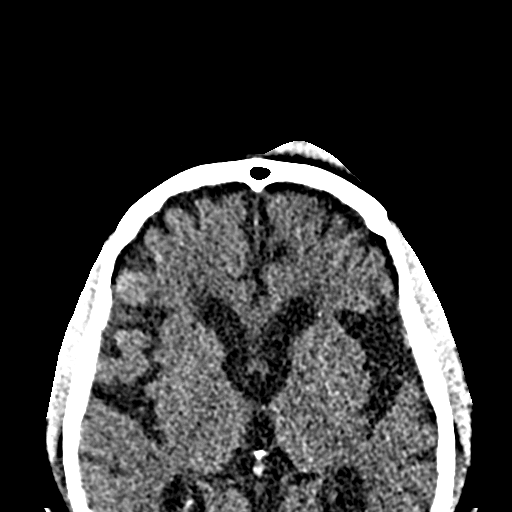

[Series 13: sagittal soft · sagittal · 0.36mm/px · 2 of 87 slices shown]
[im 29/87  brain]
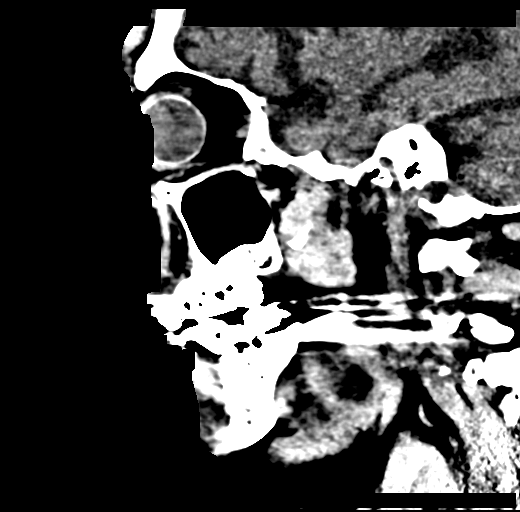
[im 58/87  brain]
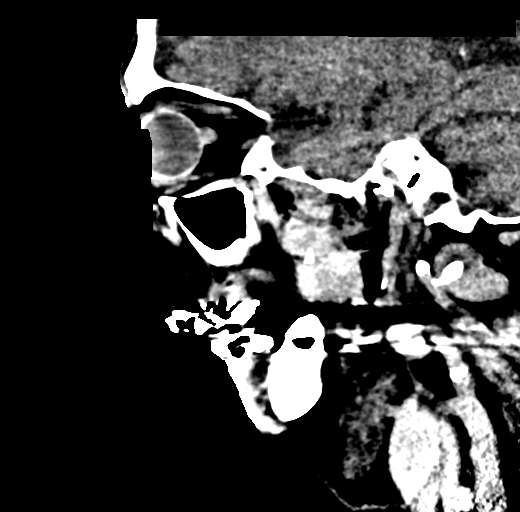

[16 of 47 positions shown; findings below may reference images not displayed]

FINDINGS: CT HEAD FINDINGS

Brain: There is no acute intracranial hemorrhage, infarction, or
mass lesion. There is diffuse cerebral cortical and cerebellar
atrophy with secondary ventricular dilatation.

Vascular: No hyperdense vessel or unexpected calcification.

Skull: Normal. Negative for fracture or focal lesion.

Other: There is a scalp hematoma over the left frontal bone.

CT MAXILLOFACIAL FINDINGS

Osseous: There is a fracture of nasal bone with slight deviation to
the right. The anterior aspect of the nasal septum is fractured.

Orbits: Intact.

Sinuses: Clear.

Soft tissues: There is a scalp hematoma over the left side of the
frontal bone overlying the left superior orbital rim.
IMPRESSION: 1. No acute intracranial abnormality.
2. Scalp hematoma over the left frontal bone.
3. Fracture of the left side of the nasal bone with slight
deviation. Fracture of the anterior aspect of the nasal septum.

## 2019-05-31 MED ORDER — CEPHALEXIN 500 MG PO CAPS: 500.0000 mg | ORAL_CAPSULE | Freq: Two times a day (BID) | ORAL | 0 refills | Status: AC

## 2019-05-31 MED ORDER — LIDOCAINE HCL (PF) 1 % IJ SOLN
INTRAMUSCULAR | Status: AC
  Filled 2019-05-31: qty 5

## 2019-05-31 MED ORDER — CEPHALEXIN 500 MG PO CAPS: 500.0000 mg | ORAL_CAPSULE | Freq: Three times a day (TID) | ORAL | 0 refills | Status: DC

## 2019-05-31 MED ORDER — CEPHALEXIN 500 MG PO CAPS: 500.0000 mg | ORAL_CAPSULE | Freq: Once | ORAL | Status: DC

## 2019-05-31 NOTE — ED Notes (Signed)
Pt to triage via w/c with no distress noted, mask in place; vss retaken and FSBS rechecked; pt given crackers & peanut butter at request; c/o pain to nose and lower lip; pt updated on wait time & voices good understanding

## 2019-05-31 NOTE — ED Provider Notes (Addendum)
[facility] Emergency Department Provider Note   ____________________________________________   First MD Initiated Contact with Patient 05/31/19 2105     (approximate)  I have reviewed the triage vital signs and the nursing notes.   HISTORY  Chief Complaint Loss of Consciousness and Facial Laceration    HPI Cory [name] is a 85 y.o. male he reports he only eats breakfast and supper no lunch.  He is diabetic.  He says he has been running into trouble with his sugar dropping can feel it dropping.  Today around about when he would have been eating lunch he went for a walk he had started to feel his sugar dropping and he turned around to come back home but he passed out.  On arrival here his sugar reportedly was 51.  He had fallen he and had a lump on his forehead 2 cuts on his nose a cut on his lip and a cut on his thumb.  Otherwise he feels okay he has no other pain or complaints.         Past Medical History:  Diagnosis Date   Diabetes mellitus without complication [facility])     Patient Active Problem List   Diagnosis Date Noted   Intractable pain 10/17/2016    History reviewed. No pertinent surgical history.  Prior to Admission medications   Medication Sig Start Date End Date Taking? Authorizing Provider  co-enzyme Q-10 30 MG capsule Take 30 mg by mouth daily.    [provider]  diclofenac sodium (VOLTAREN) 1 % GEL Apply 4 g topically 4 (four) times daily as needed (pain). 07/27/18   Merrily Brittle, MD  HUMALOG KWIKPEN 100 UNIT/ML KiwkPen Inject 25-30 Units into the skin 2 (two) times daily. 25 units with lunch and 30 units at supper. 08/18/16   [provider]  LANTUS SOLOSTAR 100 UNIT/ML Solostar Pen Inject 45 Units into the skin daily at 10 pm.  08/18/16   [provider]  lidocaine (LIDODERM) 5 % Place 1 patch onto the skin daily. Remove & Discard patch within 12 hours or as directed by MD 10/20/16   Enedina Finner, MD    loperamide (IMODIUM) 2 MG capsule Take 2 capsules (4 mg total) by mouth once. Patient not taking: Reported on 10/17/2016 09/03/15   Rockne Menghini, MD  methocarbamol (ROBAXIN) 500 MG tablet Take 1 tablet (500 mg total) by mouth every 8 (eight) hours. 10/20/16   Enedina Finner, MD  ondansetron (ZOFRAN ODT) 4 MG disintegrating tablet Take 1 tablet (4 mg total) by mouth every 8 (eight) hours as needed for nausea or vomiting. Patient not taking: Reported on 10/17/2016 09/03/15   Rockne Menghini, MD  pyridoxine (B-6) 100 MG tablet Take 100 mg by mouth daily.    [provider]  quinapril (ACCUPRIL) 20 MG tablet Take 1 tablet by mouth daily. 09/21/16   [provider]  simvastatin (ZOCOR) 40 MG tablet Take 1 tablet by mouth daily. 09/26/16   [provider]  SYNTHROID 50 MCG tablet Take 1 tablet by mouth daily. 09/26/16   [provider]  tamsulosin (FLOMAX) 0.4 MG CAPS capsule Take 1 capsule by mouth daily. 07/29/16   [provider]  traMADol (ULTRAM) 50 MG tablet Take 50 mg by mouth every 6 (six) hours as needed.    [provider]    Allergies Patient has no known allergies.  No family history on file.  Social History Social History   Tobacco Use  Smoking status: Never Smoker   Smokeless tobacco: Never Used  Substance Use Topics   Alcohol use: Yes   Drug use: Not on file    Review of Systems  Constitutional: No fever/chills Eyes: No visual changes. ENT: No sore throat. Cardiovascular: Denies chest pain. Respiratory: Denies shortness of breath. Gastrointestinal: No abdominal pain.  No nausea, no vomiting.  No diarrhea.  No constipation. Genitourinary: Negative for dysuria. Musculoskeletal: Negative for back pain. Skin: Negative for rash. Neurological: Negative for headaches, focal weakness  ____________________________________________   PHYSICAL EXAM:  VITAL SIGNS: ED Triage Vitals  Enc Vitals Group      BP 05/31/19 1713 (!) 148/110     Pulse Rate 05/31/19 1713 77     Resp 05/31/19 1713 16     Temp 05/31/19 1713 98.4 F (36.9 C)     Temp Source 05/31/19 1713 Oral     SpO2 05/31/19 1713 99 %     Weight 05/31/19 1710 172 lb (78 kg)     Height 05/31/19 1710 5\' 8"  (1.727 m)     Head Circumference --      Peak Flow --      Pain Score 05/31/19 1710 5     Pain Loc --      Pain Edu? --      Excl. in GC? --     Constitutional: Alert and oriented. Well appearing and in no acute distress. Eyes: Conjunctivae are normal.  Head: Atraumatic except for an abrasion of the lump on the left forehead cut on the nasal bridge cut on the middle of the nose cut on the inside of the lower lip and the left. Nose: No congestion/rhinnorhea. Mouth/Throat: Mucous membranes are moist.  Oropharynx non-erythematous.  See above description of lip laceration Neck: No stridor.  No cervical spine tenderness to palpation. Cardiovascular: Normal rate, regular rhythm! Grossly normal heart sounds.  Good peripheral circulation. Respiratory: Normal respiratory effort.  No retractions. Lungs CTAB. Gastrointestinal: Soft and nontender. No distention. No abdominal bruits. No CVA tenderness. Musculoskeletal: No lower extremity tenderness nor edema.   Neurologic:  Normal speech and language. No gross focal neurologic deficits are appreciated. No gait instability. Skin:  Skin is warm, dry and intact except for above-noted lacerations.    ____________________________________________   LABS (all labs ordered are listed, but only abnormal results are displayed)  Labs Reviewed  GLUCOSE, CAPILLARY - Abnormal; Notable for the following components:      Result Value   Glucose-Capillary 188 (*)    All other components within normal limits  BASIC METABOLIC PANEL - Abnormal; Notable for the following components:   Sodium 132 (*)    Glucose, Bld 243 (*)    Creatinine, Ser 1.45 (*)    GFR calc non Af Amer 43 (*)    GFR calc Af  Amer 50 (*)    All other components within normal limits  GLUCOSE, CAPILLARY - Abnormal; Notable for the following components:   Glucose-Capillary 202 (*)    All other components within normal limits  CBC  URINALYSIS, COMPLETE (UACMP) WITH MICROSCOPIC  CBG MONITORING, ED  CBG MONITORING, ED   ____________________________________________  EKG  EKG read interpreted by me shows A. fib at 77 left axis right bundle branch block no acute ST-T changes ____________________________________________  RADIOLOGY  ED MD interpretation: CT of the head and face read by radiology reviewed by me shows nasal bone fracture with some deviated septum.  Otherwise no bony injury no intracranial injury  Official radiology  report(s): Ct Head Wo Contrast  Result Date: 05/31/2019 CLINICAL DATA:  Syncope with head trauma. EXAM: CT HEAD WITHOUT CONTRAST CT MAXILLOFACIAL WITHOUT CONTRAST TECHNIQUE: Multidetector CT imaging of the head and maxillofacial structures were performed using the standard protocol without intravenous contrast. Multiplanar CT image reconstructions of the maxillofacial structures were also generated. COMPARISON:  None FINDINGS: CT HEAD FINDINGS Brain: There is no acute intracranial hemorrhage, infarction, or mass lesion. There is diffuse cerebral cortical and cerebellar atrophy with secondary ventricular dilatation. Vascular: No hyperdense vessel or unexpected calcification. Skull: Normal. Negative for fracture or focal lesion. Other: There is a scalp hematoma over the left frontal bone. CT MAXILLOFACIAL FINDINGS Osseous: There is a fracture of nasal bone with slight deviation to the right. The anterior aspect of the nasal septum is fractured. Orbits: Intact. Sinuses: Clear. Soft tissues: There is a scalp hematoma over the left side of the frontal bone overlying the left superior orbital rim. IMPRESSION: 1. No acute intracranial abnormality. 2. Scalp hematoma over the left frontal bone. 3. Fracture of  the left side of the nasal bone with slight deviation. Fracture of the anterior aspect of the nasal septum. Electronically Signed   By: Francene BoyersJames  Maxwell M.D.   On: 05/31/2019 18:15   Ct Maxillofacial Wo Contrast  Result Date: 05/31/2019 CLINICAL DATA:  Syncope with head trauma. EXAM: CT HEAD WITHOUT CONTRAST CT MAXILLOFACIAL WITHOUT CONTRAST TECHNIQUE: Multidetector CT imaging of the head and maxillofacial structures were performed using the standard protocol without intravenous contrast. Multiplanar CT image reconstructions of the maxillofacial structures were also generated. COMPARISON:  None FINDINGS: CT HEAD FINDINGS Brain: There is no acute intracranial hemorrhage, infarction, or mass lesion. There is diffuse cerebral cortical and cerebellar atrophy with secondary ventricular dilatation. Vascular: No hyperdense vessel or unexpected calcification. Skull: Normal. Negative for fracture or focal lesion. Other: There is a scalp hematoma over the left frontal bone. CT MAXILLOFACIAL FINDINGS Osseous: There is a fracture of nasal bone with slight deviation to the right. The anterior aspect of the nasal septum is fractured. Orbits: Intact. Sinuses: Clear. Soft tissues: There is a scalp hematoma over the left side of the frontal bone overlying the left superior orbital rim. IMPRESSION: 1. No acute intracranial abnormality. 2. Scalp hematoma over the left frontal bone. 3. Fracture of the left side of the nasal bone with slight deviation. Fracture of the anterior aspect of the nasal septum. Electronically Signed   By: Francene BoyersJames  Maxwell M.D.   On: 05/31/2019 18:15    ____________________________________________   PROCEDURES  Procedure(s) performed (including Critical Care): Oral consent obtained there is a small laceration with skin flap on the nasal bridge this was anesthetized with 1% lidocaine and cleaned with sterile saline 1 deep stitch of 5-0 Vicryl was placed and the skin was closed with 4-0 Prolene with 3  stitches.  Some of the skin is missing but I managed to cover good bit of the defect.  Total length of the laceration was about three quarters of a centimeter.  Horizontal deeper laceration that I closed with the Vicryl was probably 1/4 cm.  And then the three-quarter centimeter laceration diagonally across the middle of the nose was irrigated with saline and anesthetized with lidocaine 1% and closed with 2 stitches of 4-0 Vicryl.  This was approximately 1 cm long.  Then the laceration at the base of the left thumb palmar surface which was 1-1/2 cm long was anesthetized with 1% lidocaine cleaned with saline and closed with 3 stitches of 4-0  nylon then the laceration on the inside of the lip was anesthetized with 1% lidocaine with epinephrine and closed with 3 stitches of 3-0 nylon.  Patient tolerated all this very well.  Procedures   ____________________________________________   INITIAL IMPRESSION / ASSESSMENT AND PLAN / ED COURSE  Patient sugar was low and much lower than normal when it was checked after he had his syncope.  His EKG looks good.  I feel it is likely that his sugar did drop as he thinks.  I will let him go he will follow-up with his regular doctor in the next couple days and with ENT to check on his nasal fracture.              ____________________________________________   FINAL CLINICAL IMPRESSION(S) / ED DIAGNOSES  Final diagnoses:  Facial laceration, initial encounter  Syncope and collapse  Hypoglycemia     ED Discharge Orders    None       Note:  This document was prepared using Dragon voice recognition software and may include unintentional dictation errors.    Nena Polio, MD 05/31/19 2250    Nena Polio, MD 06/13/19 2029
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
## 2019-05-31 NOTE — Discharge Instructions (Addendum)
Please keep the cuts clean and dry.  You can clean them with peroxide.  Do not get it in your eye go.  Please return for increasing pain swelling redness or fever.  Please have the stitches checked in about 4 days to see if they need to come out.  Take the Keflex antibiotic 1 pill 2 times a day to help prevent infection.  Give Dr. Tami Ribas, the ear nose and throat doctor, a call in the morning set up a follow-up appointment.  He will probably see you in 3 or 4 days once the swelling goes down to see if anything needs to be done about the nose fracture.  Follow-up with your regular family doctor in the next couple days and with your diabetes doctor on Friday as planned.  Please return for any further problems.  Take it easy next couple days.  If you have any further wooziness or fainting please return here.  I would do your fingersticks 4 times a day for now and keep a close record of what is going on with them.  Show them to your doctor.

## 2019-05-31 NOTE — ED Notes (Signed)
ED Provider placing sutures

## 2019-05-31 NOTE — ED Notes (Signed)
Suture cart to bedside. 

## 2020-01-20 IMAGING — US US EXTREM LOW VENOUS*L*
1 series · 13 of 24 positions shown · non-contrast
Comparison: None.

CLINICAL DATA: 87-year-old male with left calf pain.



[Series 1: us extrem low venous*left* · 0.10mm/px · 13 of 30 slices shown]
[im 1/30]
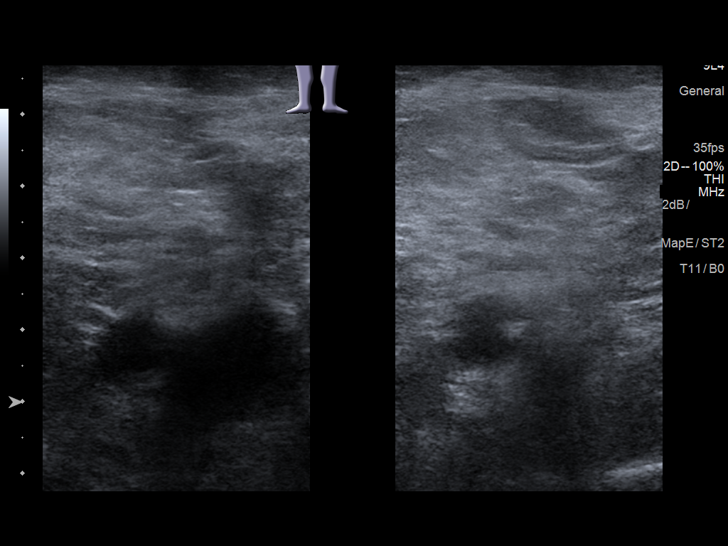
[im 3/30]
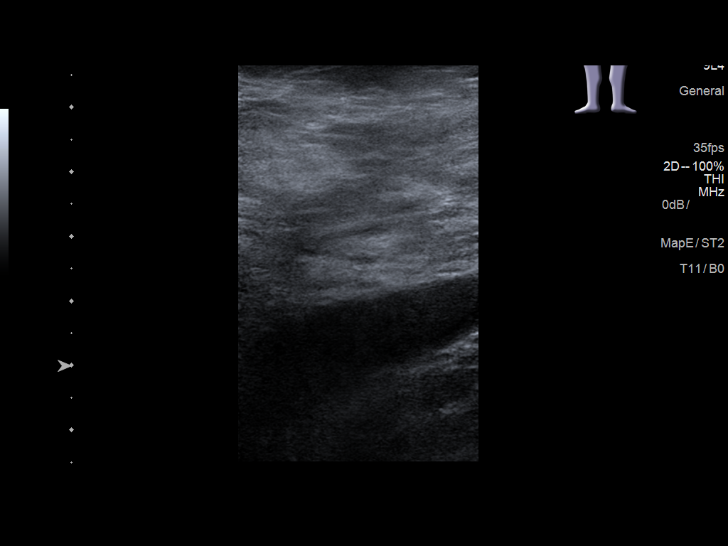
[im 6/30]
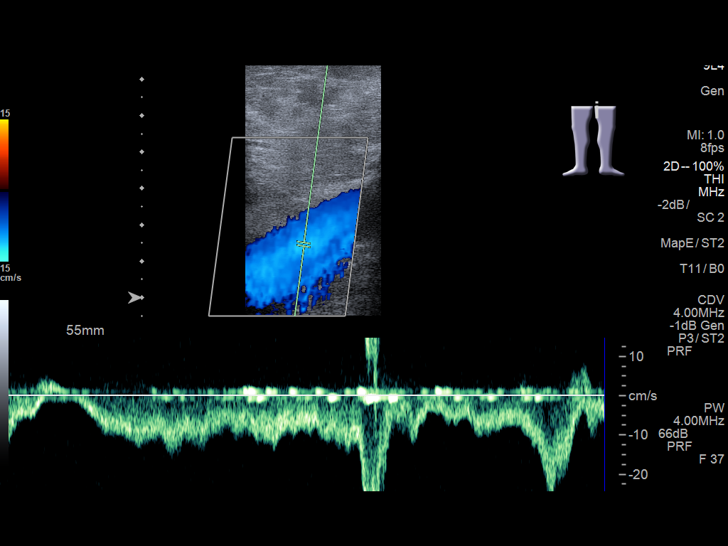
[im 8/30]
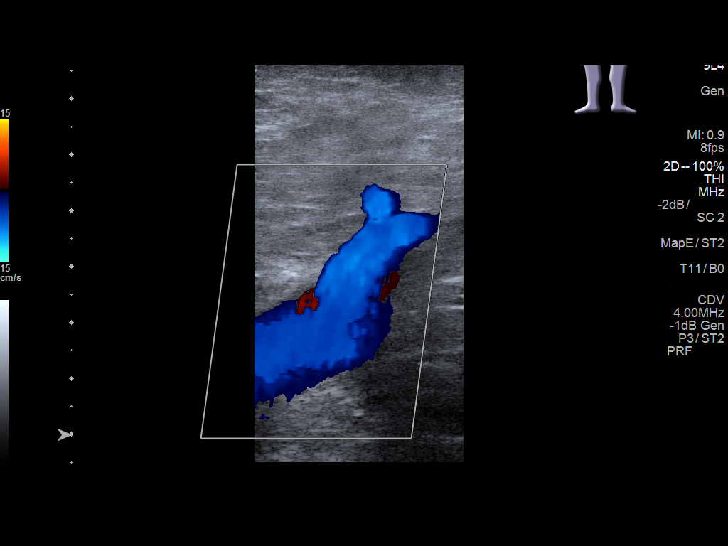
[im 11/30]
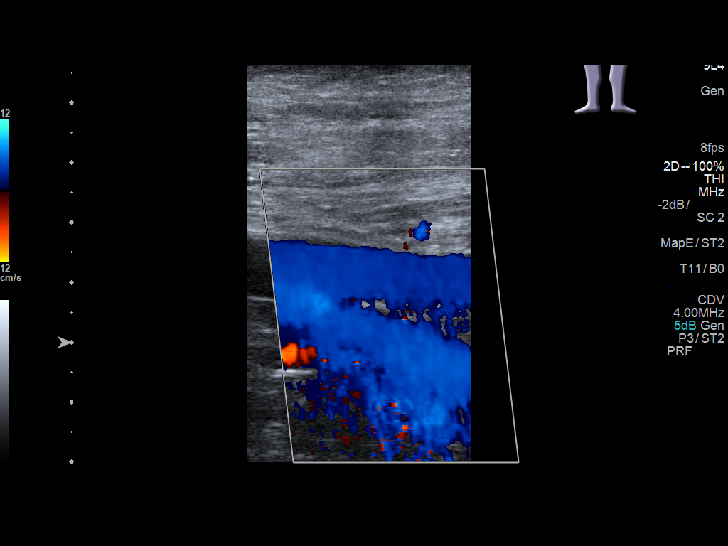
[im 13/30]
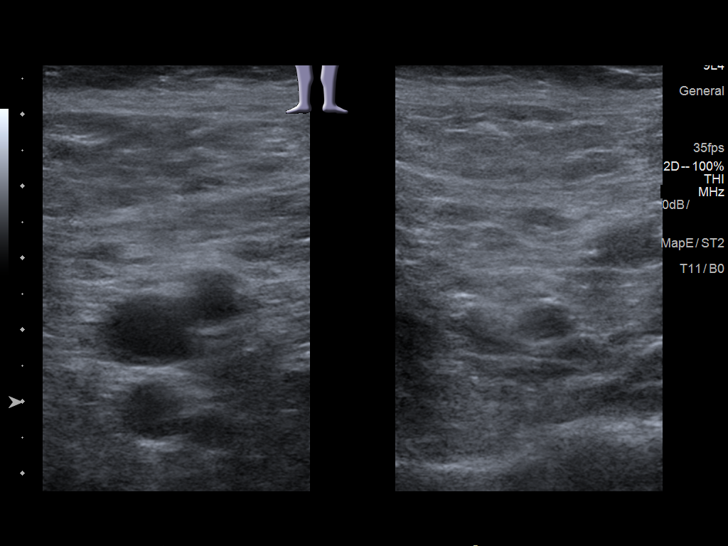
[im 16/30]
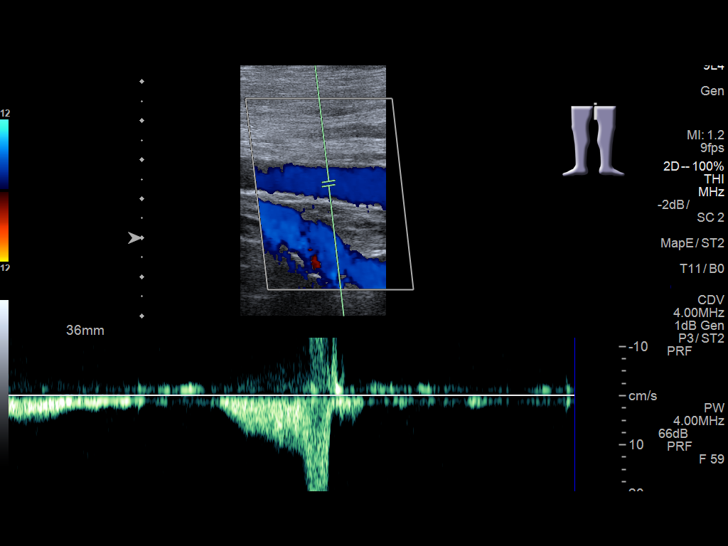
[im 17/30]
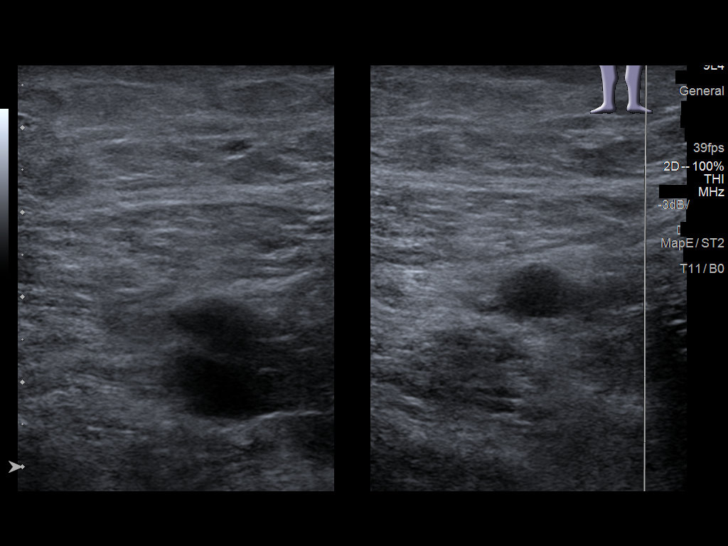
[im 19/30]
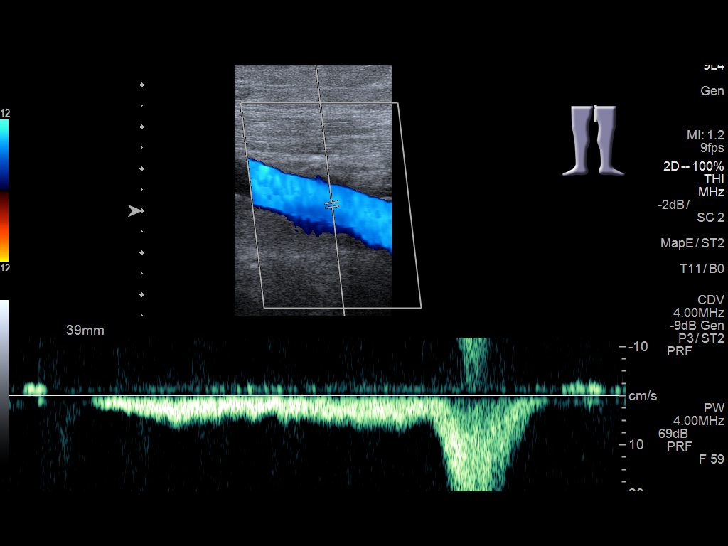
[im 22/30]
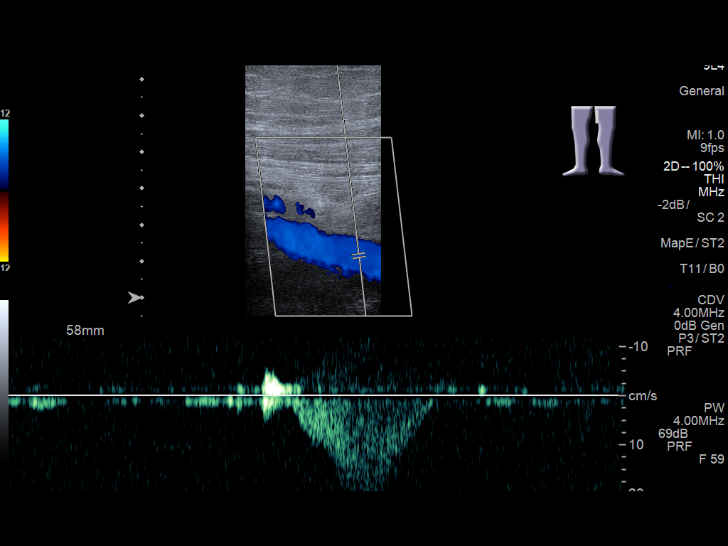
[im 24/30]
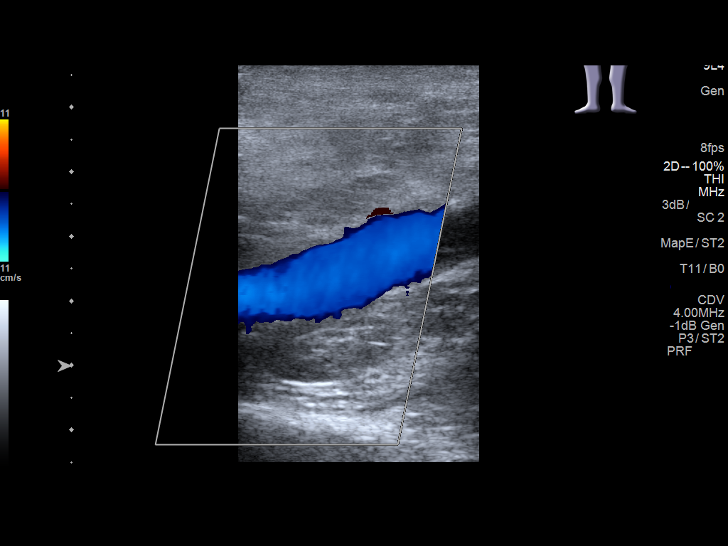
[im 27/30]
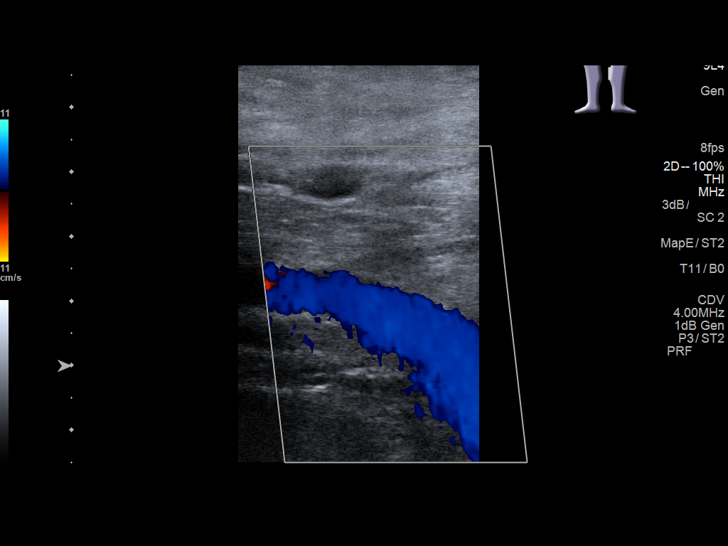
[im 30/30]
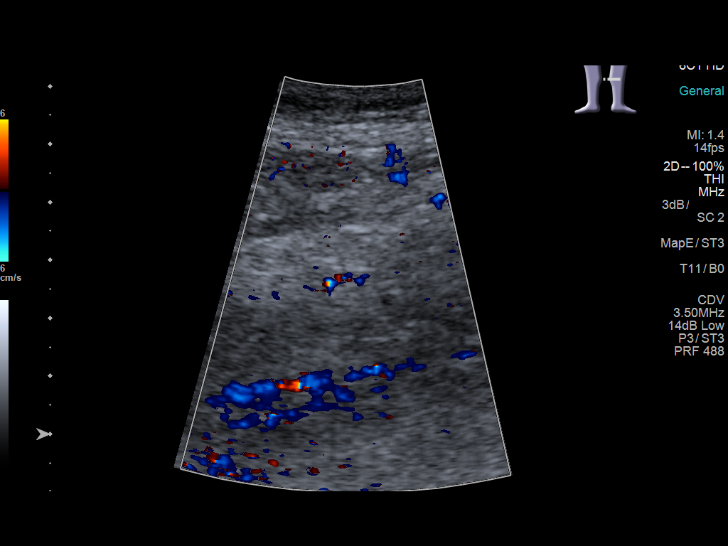

[13 of 24 positions shown; findings below may reference images not displayed]

FINDINGS: Contralateral Common Femoral Vein: Respiratory phasicity is normal
and symmetric with the symptomatic side. No evidence of thrombus.
Normal compressibility.

Common Femoral Vein: No evidence of thrombus. Normal
compressibility, respiratory phasicity and response to augmentation.

Saphenofemoral Junction: No evidence of thrombus. Normal
compressibility and flow on color Doppler imaging.

Profunda Femoral Vein: No evidence of thrombus. Normal
compressibility and flow on color Doppler imaging.

Femoral Vein: No evidence of thrombus. Normal compressibility,
respiratory phasicity and response to augmentation.

Popliteal Vein: No evidence of thrombus. Normal compressibility,
respiratory phasicity and response to augmentation.

Calf Veins: Unremarkable on limited visualization.

Superficial Great Saphenous Vein: No evidence of thrombus. Normal
compressibility.

Venous Reflux:  None.

Other Findings:  None.
IMPRESSION: No evidence of deep venous thrombosis.

## 2020-02-06 ENCOUNTER — Ambulatory Visit: Payer: Medicare Other | Attending: Neurology | Admitting: Physical Therapy

## 2020-05-31 ENCOUNTER — Emergency Department
Admission: EM | Admit: 2020-05-31 | Discharge: 2020-05-31 | Disposition: A | Discharge status: Home / Self Care | Payer: Medicare Other | Attending: Emergency Medicine | Admitting: Emergency Medicine

## 2020-05-31 ENCOUNTER — Other Ambulatory Visit: Payer: Self-pay

## 2020-05-31 ENCOUNTER — Emergency Department: Payer: Medicare Other

## 2020-05-31 ENCOUNTER — Encounter: Payer: Self-pay | Admitting: Emergency Medicine

## 2020-05-31 DIAGNOSIS — E119 Type 2 diabetes mellitus without complications: Secondary | ICD-10-CM | POA: Insufficient documentation

## 2020-05-31 DIAGNOSIS — Z79899 Other long term (current) drug therapy: Secondary | ICD-10-CM | POA: Diagnosis not present

## 2020-05-31 DIAGNOSIS — Y999 Unspecified external cause status: Secondary | ICD-10-CM | POA: Insufficient documentation

## 2020-05-31 DIAGNOSIS — Z794 Long term (current) use of insulin: Secondary | ICD-10-CM | POA: Insufficient documentation

## 2020-05-31 DIAGNOSIS — Y9389 Activity, other specified: Secondary | ICD-10-CM | POA: Insufficient documentation

## 2020-05-31 DIAGNOSIS — W19XXXA Unspecified fall, initial encounter: Secondary | ICD-10-CM | POA: Diagnosis not present

## 2020-05-31 DIAGNOSIS — Y929 Unspecified place or not applicable: Secondary | ICD-10-CM | POA: Insufficient documentation

## 2020-05-31 DIAGNOSIS — R531 Weakness: Secondary | ICD-10-CM | POA: Insufficient documentation

## 2020-05-31 DIAGNOSIS — Y939 Activity, unspecified: Secondary | ICD-10-CM | POA: Diagnosis not present

## 2020-05-31 LAB — URINALYSIS, COMPLETE (UACMP) WITH MICROSCOPIC
Bacteria, UA: NONE SEEN
Bilirubin Urine: NEGATIVE
Glucose, UA: 50 mg/dL — AB
Ketones, ur: NEGATIVE mg/dL
Leukocytes,Ua: NEGATIVE
Nitrite: NEGATIVE
Protein, ur: NEGATIVE mg/dL
Specific Gravity, Urine: 1.018 (ref 1.005–1.030)
Squamous Epithelial / HPF: NONE SEEN (ref 0–5)
pH: 5 (ref 5.0–8.0)

## 2020-05-31 LAB — BASIC METABOLIC PANEL
Anion gap: 10 (ref 5–15)
BUN: 22 mg/dL (ref 8–23)
CO2: 26 mmol/L (ref 22–32)
Calcium: 9.1 mg/dL (ref 8.9–10.3)
Chloride: 97 mmol/L — ABNORMAL LOW (ref 98–111)
Creatinine, Ser: 1.74 mg/dL — ABNORMAL HIGH (ref 0.61–1.24)
GFR calc Af Amer: 40 mL/min — ABNORMAL LOW (ref >60)
GFR calc non Af Amer: 34 mL/min — ABNORMAL LOW (ref >60)
Glucose, Bld: 187 mg/dL — ABNORMAL HIGH (ref 70–99)
Potassium: 4.4 mmol/L (ref 3.5–5.1)
Sodium: 133 mmol/L — ABNORMAL LOW (ref 135–145)

## 2020-05-31 LAB — CBC
HCT: 43.9 % (ref 39.0–52.0)
Hemoglobin: 14.5 g/dL (ref 13.0–17.0)
MCH: 31.1 pg (ref 26.0–34.0)
MCHC: 33 g/dL (ref 30.0–36.0)
MCV: 94.2 fL (ref 80.0–100.0)
Platelets: 175 10*3/uL (ref 150–400)
RBC: 4.66 MIL/uL (ref 4.22–5.81)
RDW: 14.1 % (ref 11.5–15.5)
WBC: 7.7 10*3/uL (ref 4.0–10.5)
nRBC: 0 % (ref 0.0–0.2)

## 2020-05-31 LAB — TROPONIN I (HIGH SENSITIVITY): Troponin I (High Sensitivity): 15 ng/L (ref <18)

## 2020-05-31 LAB — CK: Total CK: 527 U/L — ABNORMAL HIGH (ref 49–397)

## 2020-05-31 MED ORDER — SODIUM CHLORIDE 0.9 % IV BOLUS: 500.0000 mL | Freq: Once | INTRAVENOUS | Status: DC

## 2020-05-31 NOTE — ED Triage Notes (Signed)
Pt in via EMS. Pt fell last pm and was found laying on the floor. Pt family reports pt with increased weakness and multiple fall over the last few weeks. No LOC or hitting head.

## 2020-05-31 NOTE — Discharge Instructions (Addendum)
Please continue to follow up with your neurologist. Please talk to your primary care physician about rechecking your blood work. It is important that you make sure you stay adequately hydrated over the next couple of days. Please seek medical attention for any high fevers, chest pain, shortness of breath, change in behavior, persistent vomiting, bloody stool or any other new or concerning symptoms.

## 2020-05-31 NOTE — ED Notes (Signed)
Patient transported to CT 

## 2020-05-31 NOTE — ED Provider Notes (Signed)
[facility] Emergency Department Provider Note  ____________________________________________   I have reviewed the triage vital signs and the nursing notes.   HISTORY  Chief Complaint Fall and Weakness   History limited by: Not Limited   HPI Cory [name] is a 86 y.o. male who presents to the emergency department today because of concern for increasing falls. He states he has had roughly 4 falls over the past month. Had another fall last night. Says he is always able to get up off the ground when he falls. Did not spend more then five minutes on the ground. Denies any head injury or pain after the fall. The patient did discuss his balance concerns with his neurologist at a recent office visit. He denies any recent illness or fevers.   Records reviewed. Per medical record review patient has a history of DM  Past Medical History:  Diagnosis Date  . Diabetes mellitus without complication [facility])     Patient Active Problem List   Diagnosis Date Noted  . Intractable pain 10/17/2016    History reviewed. No pertinent surgical history.  Prior to Admission medications   Medication Sig Start Date End Date Taking? Authorizing Provider  co-enzyme Q-10 30 MG capsule Take 30 mg by mouth daily.    [provider]  diclofenac sodium (VOLTAREN) 1 % GEL Apply 4 g topically 4 (four) times daily as needed (pain). 07/27/18   Merrily Brittle, MD  HUMALOG KWIKPEN 100 UNIT/ML KiwkPen Inject 25-30 Units into the skin 2 (two) times daily. 25 units with lunch and 30 units at supper. 08/18/16   [provider]  LANTUS SOLOSTAR 100 UNIT/ML Solostar Pen Inject 45 Units into the skin daily at 10 pm.  08/18/16   [provider]  lidocaine (LIDODERM) 5 % Place 1 patch onto the skin daily. Remove & Discard patch within 12 hours or as directed by MD 10/20/16   Enedina Finner, MD  loperamide (IMODIUM) 2 MG capsule Take 2 capsules (4 mg total) by mouth once. Patient  not taking: Reported on 10/17/2016 09/03/15   Rockne Menghini, MD  methocarbamol (ROBAXIN) 500 MG tablet Take 1 tablet (500 mg total) by mouth every 8 (eight) hours. 10/20/16   Enedina Finner, MD  ondansetron (ZOFRAN ODT) 4 MG disintegrating tablet Take 1 tablet (4 mg total) by mouth every 8 (eight) hours as needed for nausea or vomiting. Patient not taking: Reported on 10/17/2016 09/03/15   Rockne Menghini, MD  pyridoxine (B-6) 100 MG tablet Take 100 mg by mouth daily.    [provider]  quinapril (ACCUPRIL) 20 MG tablet Take 1 tablet by mouth daily. 09/21/16   [provider]  simvastatin (ZOCOR) 40 MG tablet Take 1 tablet by mouth daily. 09/26/16   [provider]  SYNTHROID 50 MCG tablet Take 1 tablet by mouth daily. 09/26/16   [provider]  tamsulosin (FLOMAX) 0.4 MG CAPS capsule Take 1 capsule by mouth daily. 07/29/16   [provider]  traMADol (ULTRAM) 50 MG tablet Take 50 mg by mouth every 6 (six) hours as needed.    [provider]    Allergies Patient has no known allergies.  No family history on file.  Social History Social History   Tobacco Use  . Smoking status: Never Smoker  . Smokeless tobacco: Never Used  Substance Use Topics  . Alcohol use: Yes  . Drug use: Not on file    Review of Systems Constitutional: No fever/chills Eyes: No visual  changes. ENT: No sore throat. Cardiovascular: Denies chest pain. Respiratory: Denies shortness of breath. Gastrointestinal: No abdominal pain.  No nausea, no vomiting.  No diarrhea.   Genitourinary: Negative for dysuria. Musculoskeletal: Negative for back pain. Skin: Negative for rash. Neurological: Positive for balance issues. ____________________________________________   PHYSICAL EXAM:  VITAL SIGNS: ED Triage Vitals  Enc Vitals Group     BP 05/31/20 0931 135/61     Pulse Rate 05/31/20 0931 73     Resp 05/31/20 0909 18     Temp 05/31/20 0934 99.2 F  (37.3 C)     Temp Source 05/31/20 0934 Oral     SpO2 05/31/20 0931 98 %     Weight 05/31/20 0910 180 lb (81.6 kg)     Height 05/31/20 0910 5\' 8"  (1.727 m)   Constitutional: Alert and oriented.  Eyes: Conjunctivae are normal.  ENT      Head: Normocephalic and atraumatic.      Nose: No congestion/rhinnorhea.      Mouth/Throat: Mucous membranes are moist.      Neck: No stridor. Hematological/Lymphatic/Immunilogical: No cervical lymphadenopathy. Cardiovascular: Normal rate, regular rhythm.  No murmurs, rubs, or gallops.  Respiratory: Normal respiratory effort without tachypnea nor retractions. Breath sounds are clear and equal bilaterally. No wheezes/rales/rhonchi. Gastrointestinal: Soft and non tender. No rebound. No guarding.  Genitourinary: Deferred Musculoskeletal: Normal range of motion in all extremities. No lower extremity edema. Neurologic:  Normal speech and language. No gross focal neurologic deficits are appreciated.  Skin:  Skin is warm, dry and intact. No rash noted. Psychiatric: Mood and affect are normal. Speech and behavior are normal. Patient exhibits appropriate insight and judgment.  ____________________________________________    LABS (pertinent positives/negatives)  CBC wbc 7.7, hgb 14.5, plt 175 BMP na 133, k 4.4, gl 97, glu 187, cr 1.74 CK 527 Trop hs 15 UA clear, small hgb dipstick, 0-5 rbc and wbc ____________________________________________   EKG  I, , attending physician, personally viewed and interpreted this EKG  EKG Time: 0943 Rate: 73 Rhythm: sinus rhythm with 1st degree av block Axis: left axis deviation Intervals: qtc 473 QRS: RBBB ST changes: no st elevation Impression: abnormal ekg  ____________________________________________    RADIOLOGY  CT head No acute intracranial  abnormality  ____________________________________________   PROCEDURES  Procedures  ____________________________________________   INITIAL IMPRESSION / ASSESSMENT AND PLAN / ED COURSE  Pertinent labs & imaging results that were available during my care of the patient were reviewed by me and considered in my medical decision making (see chart for details).   Patient presented to the emergency department today because of concerns for falls.  He states he has had a number of falls over the past month.  Most recent fall was last night.  Patient denies any significant injury from the fall.  Blood work does show slight elevation of creatinine and very mild CK elevation.  No signs concerning for infection.  Troponin within normal limits.  Did order a small fluid bolus for patient however IV was unable to be obtained.  Discussed with the patient importance of hydration over the next few days and that I would like him to get his blood work rechecked through his primary care doctor.  Also discussed with patient to continue follow-up with neurology for weakness and difficulty with gait and balance. Patient has had PT referral sent.  ____________________________________________   FINAL CLINICAL IMPRESSION(S) / ED DIAGNOSES  Final diagnoses:  Fall, initial encounter     Note: This dictation  was prepared with Dragon dictation. Any transcriptional errors that result from this process are unintentional     Phineas Semen, MD 05/31/20 2206

## 2020-06-11 ENCOUNTER — Other Ambulatory Visit: Payer: Self-pay

## 2020-06-11 ENCOUNTER — Encounter: Payer: Self-pay | Admitting: Emergency Medicine

## 2020-06-11 DIAGNOSIS — E871 Hypo-osmolality and hyponatremia: Secondary | ICD-10-CM | POA: Diagnosis present

## 2020-06-11 DIAGNOSIS — E1169 Type 2 diabetes mellitus with other specified complication: Secondary | ICD-10-CM | POA: Diagnosis present

## 2020-06-11 DIAGNOSIS — R1310 Dysphagia, unspecified: Secondary | ICD-10-CM | POA: Diagnosis present

## 2020-06-11 DIAGNOSIS — Z7982 Long term (current) use of aspirin: Secondary | ICD-10-CM

## 2020-06-11 DIAGNOSIS — E785 Hyperlipidemia, unspecified: Secondary | ICD-10-CM | POA: Diagnosis present

## 2020-06-11 DIAGNOSIS — I129 Hypertensive chronic kidney disease with stage 1 through stage 4 chronic kidney disease, or unspecified chronic kidney disease: Secondary | ICD-10-CM | POA: Diagnosis present

## 2020-06-11 DIAGNOSIS — I472 Ventricular tachycardia: Secondary | ICD-10-CM | POA: Diagnosis present

## 2020-06-11 DIAGNOSIS — Z79890 Hormone replacement therapy (postmenopausal): Secondary | ICD-10-CM

## 2020-06-11 DIAGNOSIS — Z79899 Other long term (current) drug therapy: Secondary | ICD-10-CM

## 2020-06-11 DIAGNOSIS — I2699 Other pulmonary embolism without acute cor pulmonale: Secondary | ICD-10-CM | POA: Diagnosis present

## 2020-06-11 DIAGNOSIS — J9601 Acute respiratory failure with hypoxia: Secondary | ICD-10-CM | POA: Diagnosis present

## 2020-06-11 DIAGNOSIS — E039 Hypothyroidism, unspecified: Secondary | ICD-10-CM | POA: Diagnosis present

## 2020-06-11 DIAGNOSIS — N1832 Chronic kidney disease, stage 3b: Secondary | ICD-10-CM | POA: Diagnosis present

## 2020-06-11 DIAGNOSIS — E1142 Type 2 diabetes mellitus with diabetic polyneuropathy: Secondary | ICD-10-CM | POA: Diagnosis present

## 2020-06-11 DIAGNOSIS — J1282 Pneumonia due to coronavirus disease 2019: Secondary | ICD-10-CM | POA: Diagnosis present

## 2020-06-11 DIAGNOSIS — N179 Acute kidney failure, unspecified: Secondary | ICD-10-CM | POA: Diagnosis present

## 2020-06-11 DIAGNOSIS — I959 Hypotension, unspecified: Secondary | ICD-10-CM | POA: Diagnosis present

## 2020-06-11 DIAGNOSIS — R05 Cough: Secondary | ICD-10-CM | POA: Diagnosis not present

## 2020-06-11 DIAGNOSIS — R4182 Altered mental status, unspecified: Secondary | ICD-10-CM | POA: Diagnosis present

## 2020-06-11 DIAGNOSIS — E1165 Type 2 diabetes mellitus with hyperglycemia: Secondary | ICD-10-CM | POA: Diagnosis present

## 2020-06-11 DIAGNOSIS — N400 Enlarged prostate without lower urinary tract symptoms: Secondary | ICD-10-CM | POA: Diagnosis present

## 2020-06-11 DIAGNOSIS — G4733 Obstructive sleep apnea (adult) (pediatric): Secondary | ICD-10-CM | POA: Diagnosis present

## 2020-06-11 DIAGNOSIS — I4510 Unspecified right bundle-branch block: Secondary | ICD-10-CM | POA: Diagnosis present

## 2020-06-11 DIAGNOSIS — U071 COVID-19: Secondary | ICD-10-CM | POA: Diagnosis not present

## 2020-06-11 DIAGNOSIS — E875 Hyperkalemia: Secondary | ICD-10-CM | POA: Diagnosis not present

## 2020-06-11 DIAGNOSIS — E878 Other disorders of electrolyte and fluid balance, not elsewhere classified: Secondary | ICD-10-CM | POA: Diagnosis present

## 2020-06-11 DIAGNOSIS — Z66 Do not resuscitate: Secondary | ICD-10-CM | POA: Diagnosis present

## 2020-06-11 DIAGNOSIS — E861 Hypovolemia: Secondary | ICD-10-CM | POA: Diagnosis present

## 2020-06-11 DIAGNOSIS — I480 Paroxysmal atrial fibrillation: Secondary | ICD-10-CM | POA: Diagnosis present

## 2020-06-11 DIAGNOSIS — E1122 Type 2 diabetes mellitus with diabetic chronic kidney disease: Secondary | ICD-10-CM | POA: Diagnosis present

## 2020-06-11 DIAGNOSIS — Z794 Long term (current) use of insulin: Secondary | ICD-10-CM

## 2020-06-11 LAB — CBC
HCT: 45.7 % (ref 39.0–52.0)
Hemoglobin: 16.2 g/dL (ref 13.0–17.0)
MCH: 30.6 pg (ref 26.0–34.0)
MCHC: 35.4 g/dL (ref 30.0–36.0)
MCV: 86.2 fL (ref 80.0–100.0)
Platelets: 281 10*3/uL (ref 150–400)
RBC: 5.3 MIL/uL (ref 4.22–5.81)
RDW: 13.2 % (ref 11.5–15.5)
WBC: 7.3 10*3/uL (ref 4.0–10.5)
nRBC: 0 % (ref 0.0–0.2)

## 2020-06-11 LAB — COMPREHENSIVE METABOLIC PANEL
ALT: 42 U/L (ref 0–44)
AST: 47 U/L — ABNORMAL HIGH (ref 15–41)
Albumin: 3.3 g/dL — ABNORMAL LOW (ref 3.5–5.0)
Alkaline Phosphatase: 71 U/L (ref 38–126)
Anion gap: 16 — ABNORMAL HIGH (ref 5–15)
BUN: 40 mg/dL — ABNORMAL HIGH (ref 8–23)
CO2: 22 mmol/L (ref 22–32)
Calcium: 9.3 mg/dL (ref 8.9–10.3)
Chloride: 88 mmol/L — ABNORMAL LOW (ref 98–111)
Creatinine, Ser: 1.69 mg/dL — ABNORMAL HIGH (ref 0.61–1.24)
GFR calc Af Amer: 41 mL/min — ABNORMAL LOW (ref >60)
GFR calc non Af Amer: 36 mL/min — ABNORMAL LOW (ref >60)
Glucose, Bld: 391 mg/dL — ABNORMAL HIGH (ref 70–99)
Potassium: 4.7 mmol/L (ref 3.5–5.1)
Sodium: 126 mmol/L — ABNORMAL LOW (ref 135–145)
Total Bilirubin: 1.2 mg/dL (ref 0.3–1.2)
Total Protein: 7.8 g/dL (ref 6.5–8.1)

## 2020-06-11 LAB — GLUCOSE, CAPILLARY: Glucose-Capillary: 398 mg/dL — ABNORMAL HIGH (ref 70–99)

## 2020-06-11 LAB — TROPONIN I (HIGH SENSITIVITY): Troponin I (High Sensitivity): 15 ng/L (ref <18)

## 2020-06-11 LAB — MAGNESIUM: Magnesium: 2.2 mg/dL (ref 1.7–2.4)

## 2020-06-11 NOTE — ED Triage Notes (Signed)
Here for possible dehydration. Pt complaining of thirst but per EMS has not been eating and drinking well for past 5-7 weeks.  Reports walks at baseline but had to have both EMS and fire to get pt onto EMS stretcher for MD appointment. Doctor sent to ED for dehydration.  Pt lives at home with wife.  Unlabored. No pain at this time. Membranes dry. VSS at this time.

## 2020-06-12 ENCOUNTER — Emergency Department: Payer: Medicare Other

## 2020-06-12 ENCOUNTER — Encounter: Payer: Self-pay | Admitting: Radiology

## 2020-06-12 ENCOUNTER — Inpatient Hospital Stay
Admission: EM | Admit: 2020-06-12 | Discharge: 2020-06-19 | DRG: 177 | Disposition: A | Discharge status: Skilled Nursing Facility | Payer: Medicare Other | Source: Ambulatory Visit | Attending: Internal Medicine | Admitting: Internal Medicine

## 2020-06-12 DIAGNOSIS — U071 COVID-19: Principal | ICD-10-CM

## 2020-06-12 DIAGNOSIS — E1169 Type 2 diabetes mellitus with other specified complication: Secondary | ICD-10-CM | POA: Diagnosis present

## 2020-06-12 DIAGNOSIS — R5383 Other fatigue: Secondary | ICD-10-CM

## 2020-06-12 DIAGNOSIS — R531 Weakness: Secondary | ICD-10-CM

## 2020-06-12 DIAGNOSIS — I2699 Other pulmonary embolism without acute cor pulmonale: Secondary | ICD-10-CM | POA: Diagnosis present

## 2020-06-12 DIAGNOSIS — N400 Enlarged prostate without lower urinary tract symptoms: Secondary | ICD-10-CM | POA: Diagnosis present

## 2020-06-12 DIAGNOSIS — I480 Paroxysmal atrial fibrillation: Secondary | ICD-10-CM | POA: Diagnosis present

## 2020-06-12 DIAGNOSIS — R059 Cough, unspecified: Secondary | ICD-10-CM

## 2020-06-12 DIAGNOSIS — I2692 Saddle embolus of pulmonary artery without acute cor pulmonale: Secondary | ICD-10-CM | POA: Diagnosis not present

## 2020-06-12 DIAGNOSIS — N1832 Chronic kidney disease, stage 3b: Secondary | ICD-10-CM | POA: Diagnosis present

## 2020-06-12 DIAGNOSIS — E785 Hyperlipidemia, unspecified: Secondary | ICD-10-CM | POA: Diagnosis present

## 2020-06-12 DIAGNOSIS — E1100 Type 2 diabetes mellitus with hyperosmolarity without nonketotic hyperglycemic-hyperosmolar coma (NKHHC): Secondary | ICD-10-CM

## 2020-06-12 DIAGNOSIS — N179 Acute kidney failure, unspecified: Secondary | ICD-10-CM | POA: Diagnosis present

## 2020-06-12 DIAGNOSIS — E039 Hypothyroidism, unspecified: Secondary | ICD-10-CM | POA: Diagnosis present

## 2020-06-12 DIAGNOSIS — E878 Other disorders of electrolyte and fluid balance, not elsewhere classified: Secondary | ICD-10-CM | POA: Diagnosis present

## 2020-06-12 DIAGNOSIS — E1142 Type 2 diabetes mellitus with diabetic polyneuropathy: Secondary | ICD-10-CM | POA: Diagnosis present

## 2020-06-12 DIAGNOSIS — E871 Hypo-osmolality and hyponatremia: Secondary | ICD-10-CM | POA: Diagnosis present

## 2020-06-12 DIAGNOSIS — I4891 Unspecified atrial fibrillation: Secondary | ICD-10-CM | POA: Diagnosis not present

## 2020-06-12 DIAGNOSIS — J9601 Acute respiratory failure with hypoxia: Secondary | ICD-10-CM

## 2020-06-12 DIAGNOSIS — J1282 Pneumonia due to coronavirus disease 2019: Secondary | ICD-10-CM | POA: Diagnosis present

## 2020-06-12 DIAGNOSIS — E1165 Type 2 diabetes mellitus with hyperglycemia: Secondary | ICD-10-CM | POA: Diagnosis present

## 2020-06-12 DIAGNOSIS — E861 Hypovolemia: Secondary | ICD-10-CM | POA: Diagnosis present

## 2020-06-12 DIAGNOSIS — I472 Ventricular tachycardia: Secondary | ICD-10-CM | POA: Diagnosis present

## 2020-06-12 DIAGNOSIS — R05 Cough: Secondary | ICD-10-CM | POA: Diagnosis present

## 2020-06-12 DIAGNOSIS — Z66 Do not resuscitate: Secondary | ICD-10-CM | POA: Diagnosis present

## 2020-06-12 DIAGNOSIS — E875 Hyperkalemia: Secondary | ICD-10-CM

## 2020-06-12 DIAGNOSIS — I4510 Unspecified right bundle-branch block: Secondary | ICD-10-CM | POA: Diagnosis present

## 2020-06-12 DIAGNOSIS — G4733 Obstructive sleep apnea (adult) (pediatric): Secondary | ICD-10-CM | POA: Diagnosis present

## 2020-06-12 DIAGNOSIS — E1122 Type 2 diabetes mellitus with diabetic chronic kidney disease: Secondary | ICD-10-CM | POA: Diagnosis present

## 2020-06-12 DIAGNOSIS — I129 Hypertensive chronic kidney disease with stage 1 through stage 4 chronic kidney disease, or unspecified chronic kidney disease: Secondary | ICD-10-CM | POA: Diagnosis present

## 2020-06-12 DIAGNOSIS — R0902 Hypoxemia: Secondary | ICD-10-CM

## 2020-06-12 DIAGNOSIS — N189 Chronic kidney disease, unspecified: Secondary | ICD-10-CM | POA: Diagnosis not present

## 2020-06-12 DIAGNOSIS — R1310 Dysphagia, unspecified: Secondary | ICD-10-CM

## 2020-06-12 DIAGNOSIS — J9600 Acute respiratory failure, unspecified whether with hypoxia or hypercapnia: Secondary | ICD-10-CM | POA: Diagnosis not present

## 2020-06-12 DIAGNOSIS — R7989 Other specified abnormal findings of blood chemistry: Secondary | ICD-10-CM

## 2020-06-12 HISTORY — DX: Disorder of thyroid, unspecified: E07.9

## 2020-06-12 HISTORY — DX: Obstructive sleep apnea (adult) (pediatric): G47.33

## 2020-06-12 LAB — URINALYSIS, COMPLETE (UACMP) WITH MICROSCOPIC
Bacteria, UA: NONE SEEN
Bilirubin Urine: NEGATIVE
Glucose, UA: >=500 mg/dL — AB
Hgb urine dipstick: NEGATIVE
Ketones, ur: NEGATIVE mg/dL
Leukocytes,Ua: NEGATIVE
Nitrite: NEGATIVE
Protein, ur: NEGATIVE mg/dL
Specific Gravity, Urine: 1.02 (ref 1.005–1.030)
Squamous Epithelial / HPF: NONE SEEN (ref 0–5)
pH: 5 (ref 5.0–8.0)

## 2020-06-12 LAB — BLOOD GAS, VENOUS
Acid-Base Excess: 2.9 mmol/L — ABNORMAL HIGH (ref 0.0–2.0)
Bicarbonate: 29.9 mmol/L — ABNORMAL HIGH (ref 20.0–28.0)
O2 Saturation: 47.2 %
Patient temperature: 37
pCO2, Ven: 53 mmHg (ref 44.0–60.0)
pH, Ven: 7.36 (ref 7.250–7.430)
pO2, Ven: <31 mmHg — CL (ref 32.0–45.0)

## 2020-06-12 LAB — GLUCOSE, CAPILLARY
Glucose-Capillary: 442 mg/dL — ABNORMAL HIGH (ref 70–99)
Glucose-Capillary: >600 mg/dL (ref 70–99)
Glucose-Capillary: >600 mg/dL (ref 70–99)
Glucose-Capillary: 533 mg/dL (ref 70–99)
Glucose-Capillary: 591 mg/dL (ref 70–99)
Glucose-Capillary: 557 mg/dL (ref 70–99)
Glucose-Capillary: 471 mg/dL — ABNORMAL HIGH (ref 70–99)
Glucose-Capillary: 423 mg/dL — ABNORMAL HIGH (ref 70–99)
Glucose-Capillary: 425 mg/dL — ABNORMAL HIGH (ref 70–99)
Glucose-Capillary: 329 mg/dL — ABNORMAL HIGH (ref 70–99)
Glucose-Capillary: 286 mg/dL — ABNORMAL HIGH (ref 70–99)
Glucose-Capillary: 205 mg/dL — ABNORMAL HIGH (ref 70–99)

## 2020-06-12 LAB — BASIC METABOLIC PANEL
Anion gap: 15 (ref 5–15)
Anion gap: 15 (ref 5–15)
BUN: 43 mg/dL — ABNORMAL HIGH (ref 8–23)
BUN: 48 mg/dL — ABNORMAL HIGH (ref 8–23)
CO2: 24 mmol/L (ref 22–32)
CO2: 22 mmol/L (ref 22–32)
Calcium: 9 mg/dL (ref 8.9–10.3)
Calcium: 8.7 mg/dL — ABNORMAL LOW (ref 8.9–10.3)
Chloride: 88 mmol/L — ABNORMAL LOW (ref 98–111)
Chloride: 87 mmol/L — ABNORMAL LOW (ref 98–111)
Creatinine, Ser: 1.83 mg/dL — ABNORMAL HIGH (ref 0.61–1.24)
Creatinine, Ser: 1.97 mg/dL — ABNORMAL HIGH (ref 0.61–1.24)
GFR calc Af Amer: 38 mL/min — ABNORMAL LOW (ref >60)
GFR calc Af Amer: 34 mL/min — ABNORMAL LOW (ref >60)
GFR calc non Af Amer: 32 mL/min — ABNORMAL LOW (ref >60)
GFR calc non Af Amer: 30 mL/min — ABNORMAL LOW (ref >60)
Glucose, Bld: 466 mg/dL — ABNORMAL HIGH (ref 70–99)
Glucose, Bld: 592 mg/dL (ref 70–99)
Potassium: 5.3 mmol/L — ABNORMAL HIGH (ref 3.5–5.1)
Potassium: 5.7 mmol/L — ABNORMAL HIGH (ref 3.5–5.1)
Sodium: 127 mmol/L — ABNORMAL LOW (ref 135–145)
Sodium: 124 mmol/L — ABNORMAL LOW (ref 135–145)

## 2020-06-12 LAB — BLOOD GAS, ARTERIAL
Acid-base deficit: 3.1 mmol/L — ABNORMAL HIGH (ref 0.0–2.0)
Bicarbonate: 21.2 mmol/L (ref 20.0–28.0)
FIO2: 0.28
O2 Saturation: 97.1 %
Patient temperature: 37
pCO2 arterial: 35 mmHg (ref 32.0–48.0)
pH, Arterial: 7.39 (ref 7.350–7.450)
pO2, Arterial: 93 mmHg (ref 83.0–108.0)

## 2020-06-12 LAB — CBC WITH DIFFERENTIAL/PLATELET
Abs Immature Granulocytes: 0.04 10*3/uL (ref 0.00–0.07)
Basophils Absolute: 0 10*3/uL (ref 0.0–0.1)
Basophils Relative: 0 %
Eosinophils Absolute: 0 10*3/uL (ref 0.0–0.5)
Eosinophils Relative: 0 %
HCT: 47.1 % (ref 39.0–52.0)
Hemoglobin: 16.5 g/dL (ref 13.0–17.0)
Immature Granulocytes: 1 %
Lymphocytes Relative: 19 %
Lymphs Abs: 1.1 10*3/uL (ref 0.7–4.0)
MCH: 30.6 pg (ref 26.0–34.0)
MCHC: 35 g/dL (ref 30.0–36.0)
MCV: 87.2 fL (ref 80.0–100.0)
Monocytes Absolute: 0.1 10*3/uL (ref 0.1–1.0)
Monocytes Relative: 2 %
Neutro Abs: 4.7 10*3/uL (ref 1.7–7.7)
Neutrophils Relative %: 78 %
Platelets: 240 10*3/uL (ref 150–400)
RBC: 5.4 MIL/uL (ref 4.22–5.81)
RDW: 13.3 % (ref 11.5–15.5)
WBC: 6 10*3/uL (ref 4.0–10.5)
nRBC: 0 % (ref 0.0–0.2)

## 2020-06-12 LAB — RESPIRATORY PANEL BY RT PCR (FLU A&B, COVID)
Influenza A by PCR: NEGATIVE
Influenza B by PCR: NEGATIVE
SARS Coronavirus 2 by RT PCR: POSITIVE — AB

## 2020-06-12 LAB — HEMOGLOBIN A1C
Hgb A1c MFr Bld: 9.1 % — ABNORMAL HIGH (ref 4.8–5.6)
Hgb A1c MFr Bld: 9.2 % — ABNORMAL HIGH (ref 4.8–5.6)
Mean Plasma Glucose: 214.47 mg/dL
Mean Plasma Glucose: 217.34 mg/dL

## 2020-06-12 LAB — TROPONIN I (HIGH SENSITIVITY): Troponin I (High Sensitivity): 14 ng/L (ref <18)

## 2020-06-12 LAB — C-REACTIVE PROTEIN: CRP: 8.5 mg/dL — ABNORMAL HIGH (ref <1.0)

## 2020-06-12 LAB — TSH: TSH: 3.037 u[IU]/mL (ref 0.350–4.500)

## 2020-06-12 IMAGING — CT CT ABD-PELV W/ CM
2 of 5 series · 16 of 46 positions shown, 18 images · IV contrast (APPLIED)
Comparison: None.

CLINICAL DATA: Abdominal pain

EXAM:
CT ABDOMEN AND PELVIS WITH CONTRAST
TECHNIQUE: Multidetector CT imaging of the abdomen and pelvis was performed
using the standard protocol following bolus administration of
intravenous contrast.
CONTRAST:  75mL OMNIPAQUE IOHEXOL 300 MG/ML  SOLN

[Series 2: routine abd/pel with · axial · 0.90mm/px · z∈[-547,-42]mm · 13 of 113 slices shown, 15 images]
[im 6/113  soft-tissue]
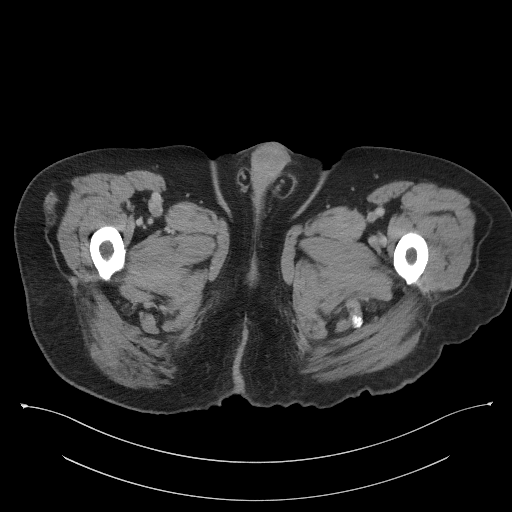
[im 6/113  bone]
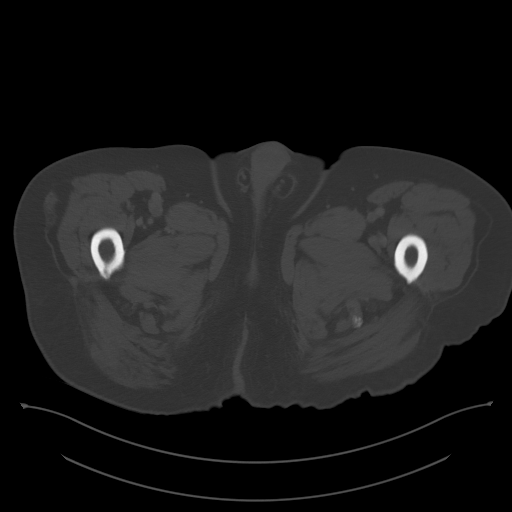
[im 18/113  soft-tissue]
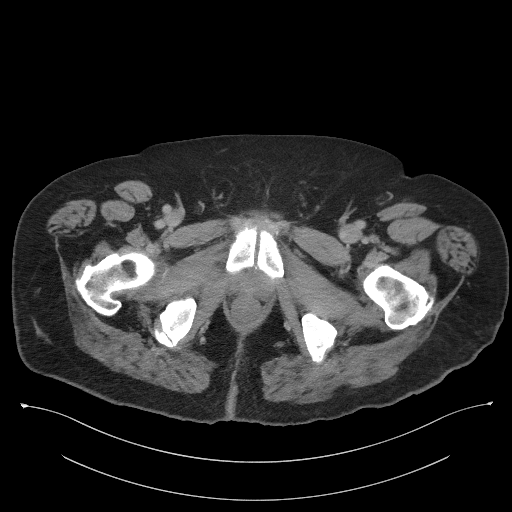
[im 24/113  soft-tissue]
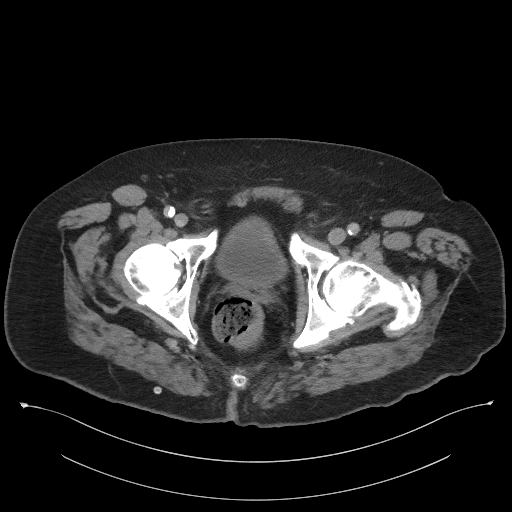
[im 30/113  soft-tissue]
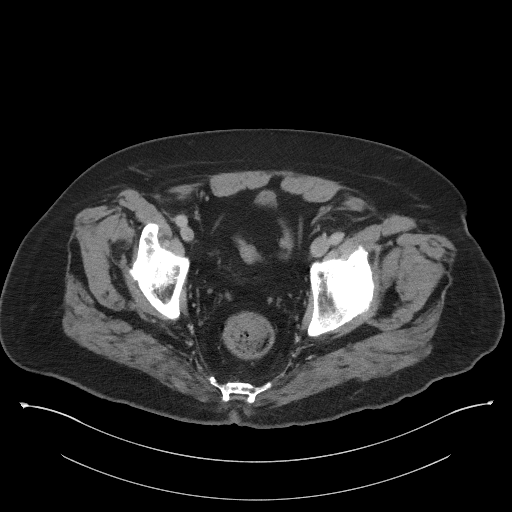
[im 42/113  soft-tissue]
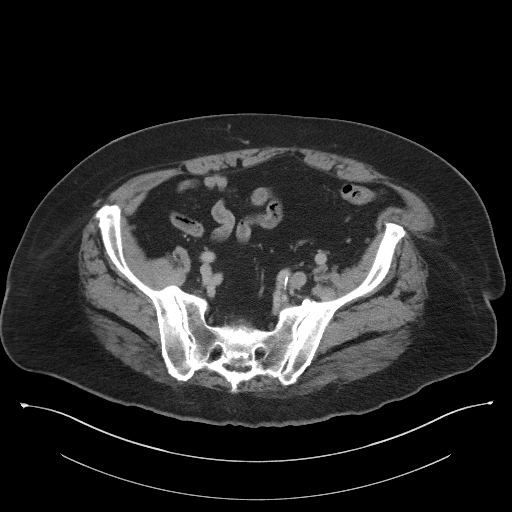
[im 48/113  soft-tissue]
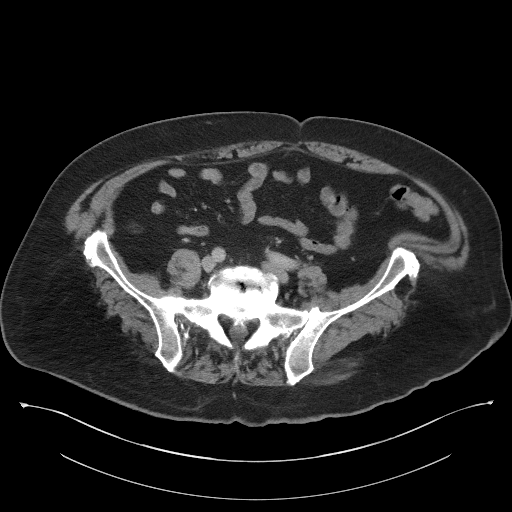
[im 59/113  soft-tissue]
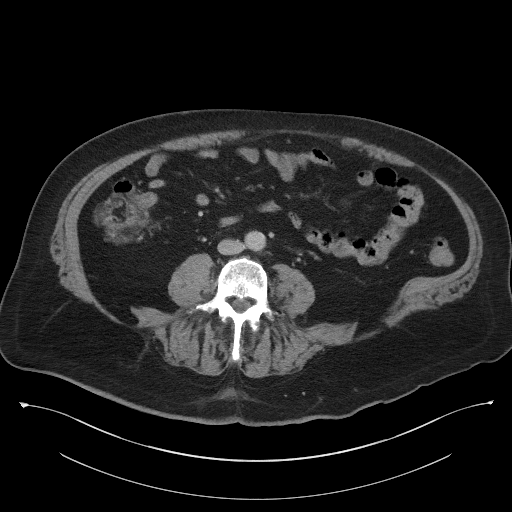
[im 65/113  soft-tissue]
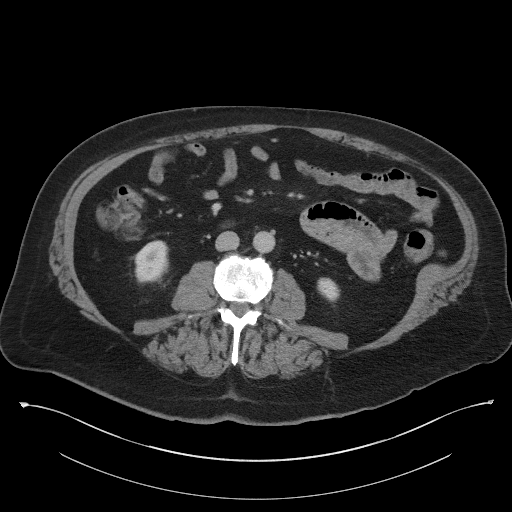
[im 71/113  soft-tissue]
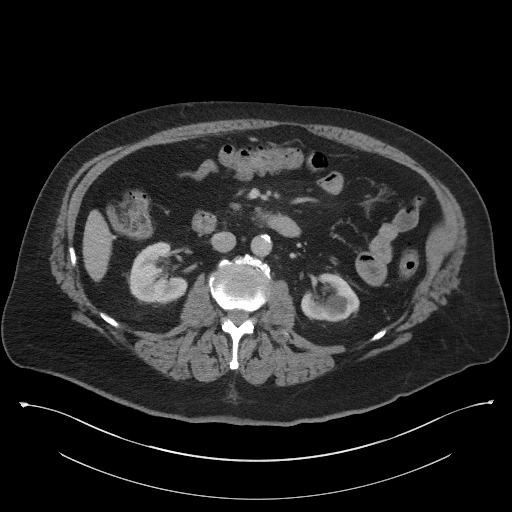
[im 71/113  bone]
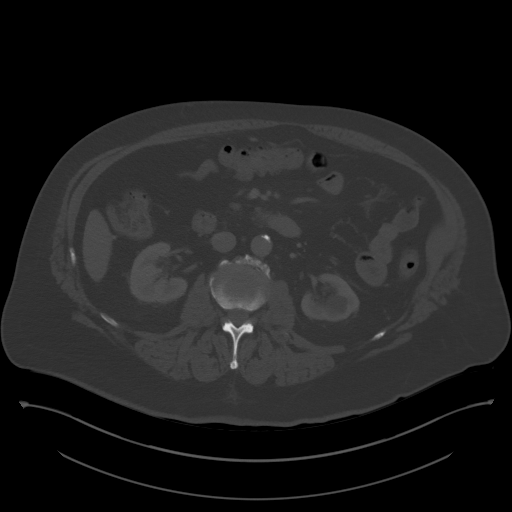
[im 83/113  soft-tissue]
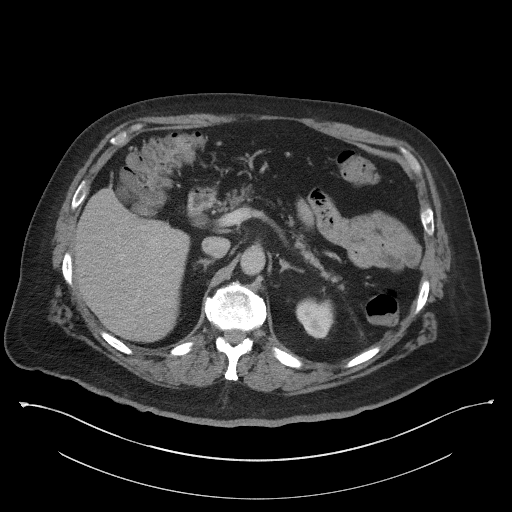
[im 89/113  soft-tissue]
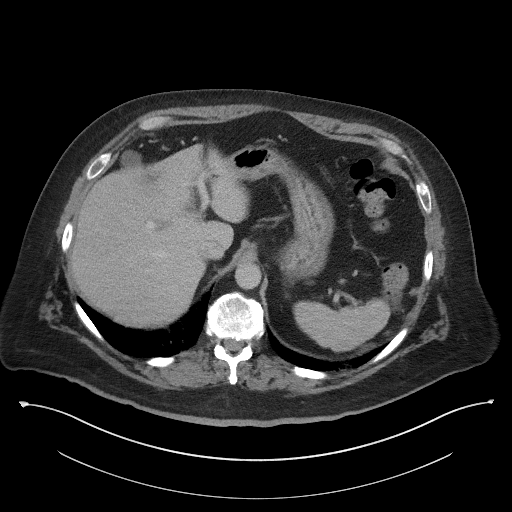
[im 95/113  soft-tissue]
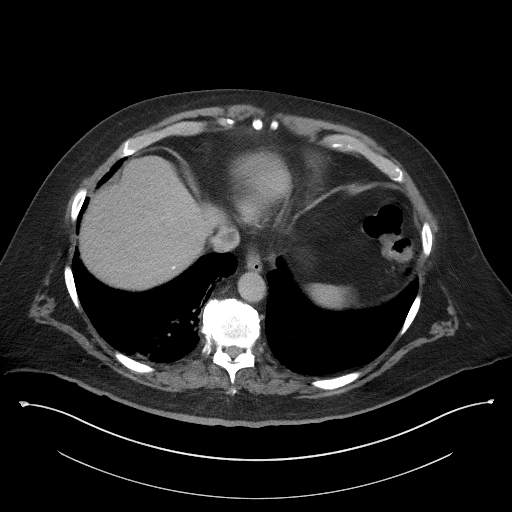
[im 107/113  soft-tissue]
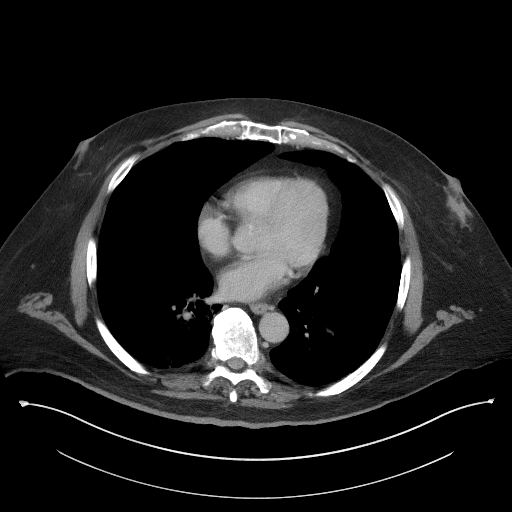

[Series 5: coronal st · coronal · 0.97mm/px · 3 of 103 slices shown]
[im 35/103  soft-tissue]
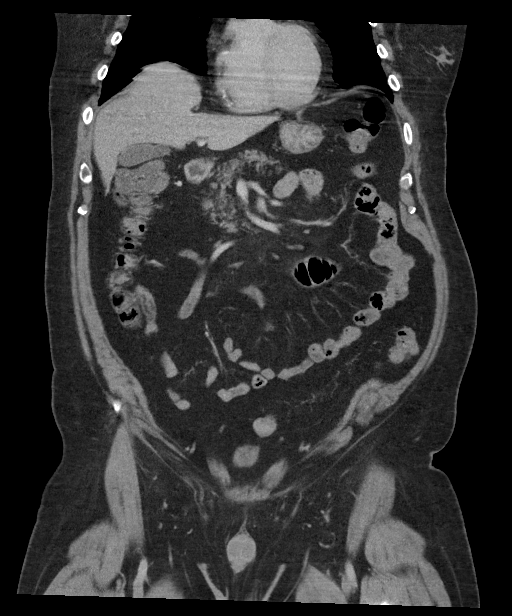
[im 46/103  soft-tissue]
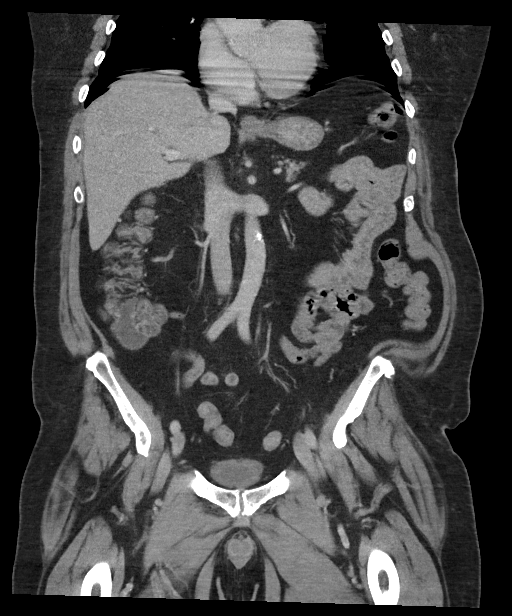
[im 57/103  soft-tissue]
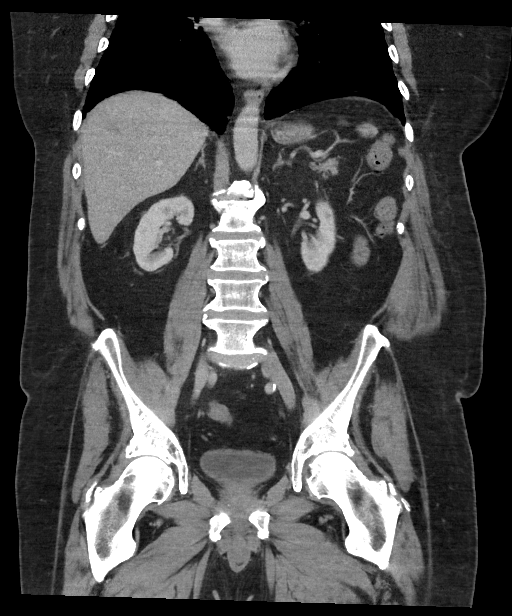

[16 of 46 positions shown; findings below may reference images not displayed]

FINDINGS: LOWER CHEST: Multifocal bibasilar ground-glass opacity.

HEPATOBILIARY: Normal hepatic contours. No intra- or extrahepatic
biliary dilatation. Normal gallbladder.

PANCREAS: Normal pancreas. No ductal dilatation or peripancreatic
fluid collection.

SPLEEN: Normal.

ADRENALS/URINARY TRACT: The adrenal glands are normal. No
hydronephrosis, nephroureterolithiasis or solid renal mass. The
urinary bladder is normal for degree of distention

STOMACH/BOWEL: There is no hiatal hernia. Normal duodenal course and
caliber. No small bowel dilatation or inflammation. No focal colonic
abnormality. Normal appendix. Calcified focus in the right upper
quadrant.

VASCULAR/LYMPHATIC: There is calcific atherosclerosis of the
abdominal aorta. No lymphadenopathy.

REPRODUCTIVE: There are calcifications within the normal-sized
prostate. Symmetric seminal vesicles.

MUSCULOSKELETAL. Multilevel degenerative disc disease and facet
arthrosis. No bony spinal canal stenosis.

OTHER: None.
IMPRESSION: 1. No acute abnormality of the abdomen or pelvis.
2. Multifocal bibasilar ground-glass opacity, concerning for
infection or aspiration.

Aortic Atherosclerosis ([0L]-[0L]).

## 2020-06-12 IMAGING — DX DG CHEST 1V PORT
1 series · 1 of 1 positions shown · non-contrast
Comparison: [DATE]

CLINICAL DATA: Generalized weakness with cough

EXAM:
PORTABLE CHEST 1 VIEW

[chest ap]
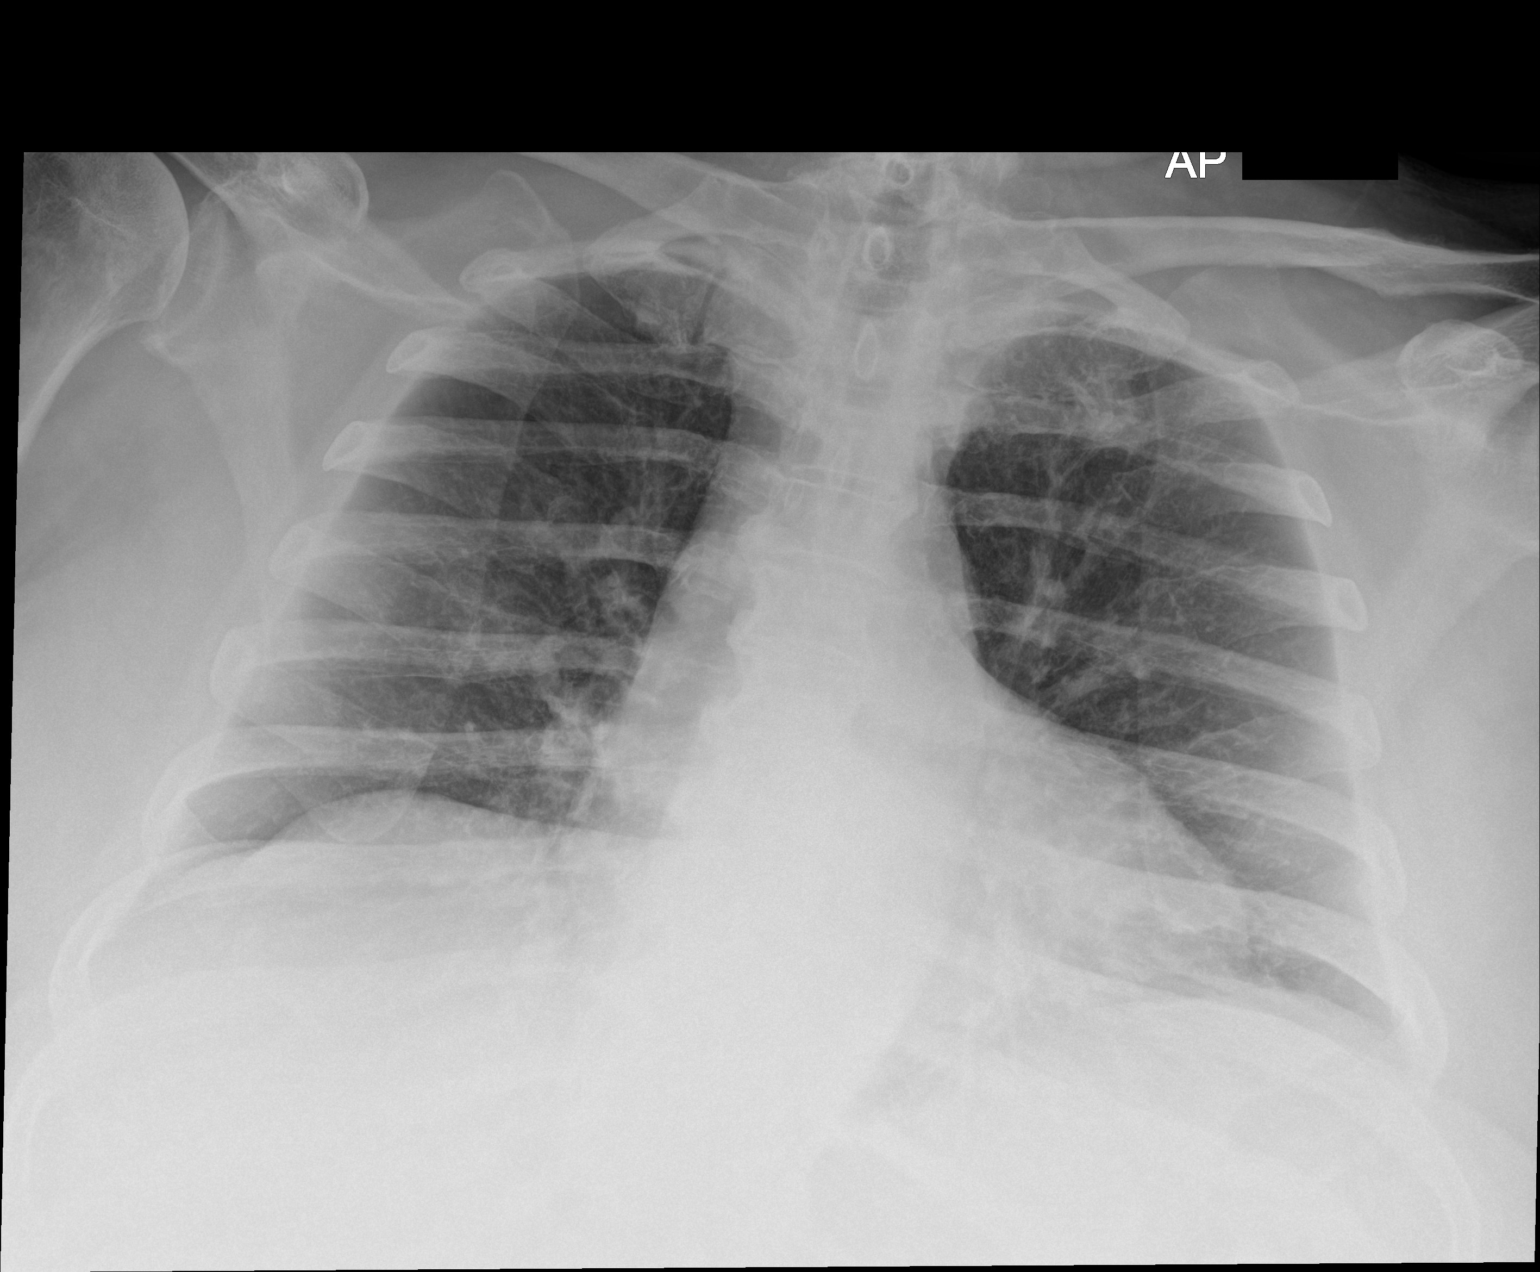

[1 of 1 positions shown; findings below may reference images not displayed]

FINDINGS: Streaky atelectasis at the left base. No consolidation or effusion.
Stable cardiomediastinal silhouette. No pneumothorax.
IMPRESSION: Streaky atelectasis at the left base.

## 2020-06-12 MED ORDER — INSULIN ASPART 100 UNIT/ML ~~LOC~~ SOLN
20.0000 [IU] | Freq: Once | SUBCUTANEOUS | Status: AC
  Administered 2020-06-12: 20 [IU] via SUBCUTANEOUS

## 2020-06-12 MED ORDER — INSULIN ASPART 100 UNIT/ML ~~LOC~~ SOLN: 0.0000 [IU] | Freq: Every day | SUBCUTANEOUS | Status: DC

## 2020-06-12 MED ORDER — QUINAPRIL HCL 10 MG PO TABS
20.0000 mg | ORAL_TABLET | Freq: Every day | ORAL | Status: DC
  Administered 2020-06-12 – 2020-06-17 (×6): 20 mg via ORAL
  Filled 2020-06-12 (×6): qty 2

## 2020-06-12 MED ORDER — ONDANSETRON HCL 4 MG PO TABS: 4.0000 mg | ORAL_TABLET | Freq: Four times a day (QID) | ORAL | Status: DC | PRN

## 2020-06-12 MED ORDER — GABAPENTIN 300 MG PO CAPS
300.0000 mg | ORAL_CAPSULE | Freq: Three times a day (TID) | ORAL | Status: DC
  Administered 2020-06-12 – 2020-06-13 (×4): 300 mg via ORAL
  Filled 2020-06-12 (×4): qty 1

## 2020-06-12 MED ORDER — SODIUM CHLORIDE 0.9 % IV SOLN
100.0000 mg | Freq: Every day | INTRAVENOUS | Status: AC
  Administered 2020-06-13 – 2020-06-16 (×4): 100 mg via INTRAVENOUS
  Filled 2020-06-12: qty 100
  Filled 2020-06-12 (×4): qty 20

## 2020-06-12 MED ORDER — SIMVASTATIN 10 MG PO TABS: 40.0000 mg | ORAL_TABLET | Freq: Every day | ORAL | Status: DC

## 2020-06-12 MED ORDER — METHYLPREDNISOLONE SODIUM SUCC 125 MG IJ SOLR
1.0000 mg/kg | Freq: Two times a day (BID) | INTRAMUSCULAR | Status: DC
  Filled 2020-06-12: qty 2

## 2020-06-12 MED ORDER — BASAGLAR KWIKPEN 100 UNIT/ML ~~LOC~~ SOPN: 70.0000 [IU] | PEN_INJECTOR | Freq: Every day | SUBCUTANEOUS | Status: DC

## 2020-06-12 MED ORDER — HYDROCHLOROTHIAZIDE 25 MG PO TABS: 12.5000 mg | ORAL_TABLET | Freq: Every day | ORAL | Status: DC

## 2020-06-12 MED ORDER — COENZYME Q10 30 MG PO CAPS: 30.0000 mg | ORAL_CAPSULE | Freq: Every day | ORAL | Status: DC

## 2020-06-12 MED ORDER — LACTATED RINGERS IV BOLUS
1000.0000 mL | Freq: Once | INTRAVENOUS | Status: AC
  Administered 2020-06-12: 1000 mL via INTRAVENOUS

## 2020-06-12 MED ORDER — DEXTROSE 50 % IV SOLN: 0.0000 mL | INTRAVENOUS | Status: DC | PRN

## 2020-06-12 MED ORDER — ZINC SULFATE 220 (50 ZN) MG PO CAPS
220.0000 mg | ORAL_CAPSULE | Freq: Every day | ORAL | Status: DC
  Administered 2020-06-12 – 2020-06-19 (×8): 220 mg via ORAL
  Filled 2020-06-12 (×8): qty 1

## 2020-06-12 MED ORDER — ASCORBIC ACID 500 MG PO TABS
500.0000 mg | ORAL_TABLET | Freq: Every day | ORAL | Status: DC
  Administered 2020-06-12 – 2020-06-18 (×7): 500 mg via ORAL
  Filled 2020-06-12 (×7): qty 1

## 2020-06-12 MED ORDER — ONDANSETRON HCL 4 MG/2ML IJ SOLN: 4.0000 mg | Freq: Four times a day (QID) | INTRAMUSCULAR | Status: DC | PRN

## 2020-06-12 MED ORDER — INSULIN GLARGINE 100 UNIT/ML ~~LOC~~ SOLN: 35.0000 [IU] | Freq: Every day | SUBCUTANEOUS | Status: DC

## 2020-06-12 MED ORDER — INSULIN ASPART 100 UNIT/ML ~~LOC~~ SOLN
0.0000 [IU] | Freq: Three times a day (TID) | SUBCUTANEOUS | Status: DC
  Administered 2020-06-12: 20 [IU] via SUBCUTANEOUS

## 2020-06-12 MED ORDER — LACTATED RINGERS IV BOLUS
20.0000 mL/kg | Freq: Once | INTRAVENOUS | Status: AC
  Administered 2020-06-12: 1632 mL via INTRAVENOUS

## 2020-06-12 MED ORDER — HYDROCOD POLST-CPM POLST ER 10-8 MG/5ML PO SUER: 5.0000 mL | Freq: Two times a day (BID) | ORAL | Status: DC | PRN

## 2020-06-12 MED ORDER — ASPIRIN EC 81 MG PO TBEC
81.0000 mg | DELAYED_RELEASE_TABLET | Freq: Every day | ORAL | Status: DC
  Administered 2020-06-12 – 2020-06-19 (×8): 81 mg via ORAL
  Filled 2020-06-12 (×8): qty 1

## 2020-06-12 MED ORDER — INSULIN ASPART 100 UNIT/ML ~~LOC~~ SOLN
5.0000 [IU] | Freq: Three times a day (TID) | SUBCUTANEOUS | Status: DC
  Administered 2020-06-12: 5 [IU] via SUBCUTANEOUS
  Filled 2020-06-12: qty 1

## 2020-06-12 MED ORDER — DEXTROSE IN LACTATED RINGERS 5 % IV SOLN: INTRAVENOUS | Status: DC

## 2020-06-12 MED ORDER — TAMSULOSIN HCL 0.4 MG PO CAPS
0.4000 mg | ORAL_CAPSULE | Freq: Every day | ORAL | Status: DC
  Administered 2020-06-12 – 2020-06-19 (×8): 0.4 mg via ORAL
  Filled 2020-06-12 (×8): qty 1

## 2020-06-12 MED ORDER — INSULIN ASPART 100 UNIT/ML ~~LOC~~ SOLN
10.0000 [IU] | Freq: Once | SUBCUTANEOUS | Status: AC
  Administered 2020-06-12: 10 [IU] via SUBCUTANEOUS
  Filled 2020-06-12: qty 1

## 2020-06-12 MED ORDER — SODIUM CHLORIDE 0.9 % IV SOLN: INTRAVENOUS | Status: DC

## 2020-06-12 MED ORDER — INSULIN ASPART 100 UNIT/ML ~~LOC~~ SOLN
0.0000 [IU] | Freq: Three times a day (TID) | SUBCUTANEOUS | Status: DC
  Administered 2020-06-12: 20 [IU] via SUBCUTANEOUS
  Filled 2020-06-12: qty 1

## 2020-06-12 MED ORDER — MAGNESIUM HYDROXIDE 400 MG/5ML PO SUSP
30.0000 mL | Freq: Every day | ORAL | Status: DC | PRN
  Filled 2020-06-12: qty 30

## 2020-06-12 MED ORDER — INSULIN REGULAR(HUMAN) IN NACL 100-0.9 UT/100ML-% IV SOLN
INTRAVENOUS | Status: DC
  Administered 2020-06-12: 13 [IU]/h via INTRAVENOUS
  Filled 2020-06-12: qty 100

## 2020-06-12 MED ORDER — PREDNISONE 20 MG PO TABS
40.0000 mg | ORAL_TABLET | Freq: Every day | ORAL | Status: DC
  Administered 2020-06-13 – 2020-06-16 (×4): 40 mg via ORAL
  Filled 2020-06-12 (×4): qty 2

## 2020-06-12 MED ORDER — APIXABAN 2.5 MG PO TABS
2.5000 mg | ORAL_TABLET | Freq: Two times a day (BID) | ORAL | Status: DC
  Administered 2020-06-12 – 2020-06-15 (×7): 2.5 mg via ORAL
  Filled 2020-06-12 (×8): qty 1

## 2020-06-12 MED ORDER — VITAMIN B-12 1000 MCG PO TABS
1000.0000 ug | ORAL_TABLET | Freq: Every day | ORAL | Status: DC
  Administered 2020-06-12 – 2020-06-19 (×8): 1000 ug via ORAL
  Filled 2020-06-12 (×8): qty 1

## 2020-06-12 MED ORDER — IOHEXOL 300 MG/ML  SOLN
75.0000 mL | Freq: Once | INTRAMUSCULAR | Status: AC | PRN
  Administered 2020-06-12: 75 mL via INTRAVENOUS

## 2020-06-12 MED ORDER — PREDNISONE 20 MG PO TABS: 50.0000 mg | ORAL_TABLET | Freq: Every day | ORAL | Status: DC

## 2020-06-12 MED ORDER — INSULIN ASPART 100 UNIT/ML ~~LOC~~ SOLN: 0.0000 [IU] | Freq: Three times a day (TID) | SUBCUTANEOUS | Status: DC

## 2020-06-12 MED ORDER — TRAZODONE HCL 50 MG PO TABS
25.0000 mg | ORAL_TABLET | Freq: Every evening | ORAL | Status: DC | PRN
  Filled 2020-06-12: qty 1

## 2020-06-12 MED ORDER — FAMOTIDINE 20 MG PO TABS
20.0000 mg | ORAL_TABLET | Freq: Two times a day (BID) | ORAL | Status: DC
  Administered 2020-06-12 – 2020-06-13 (×3): 20 mg via ORAL
  Filled 2020-06-12 (×3): qty 1

## 2020-06-12 MED ORDER — ACETAMINOPHEN 325 MG PO TABS
650.0000 mg | ORAL_TABLET | Freq: Four times a day (QID) | ORAL | Status: DC | PRN
  Administered 2020-06-19: 03:00:00 650 mg via ORAL
  Filled 2020-06-12: qty 2

## 2020-06-12 MED ORDER — TRAMADOL HCL 50 MG PO TABS: 50.0000 mg | ORAL_TABLET | Freq: Four times a day (QID) | ORAL | Status: DC | PRN

## 2020-06-12 MED ORDER — GUAIFENESIN ER 600 MG PO TB12
600.0000 mg | ORAL_TABLET | Freq: Two times a day (BID) | ORAL | Status: DC
  Administered 2020-06-12 – 2020-06-19 (×15): 600 mg via ORAL
  Filled 2020-06-12 (×15): qty 1

## 2020-06-12 MED ORDER — GUAIFENESIN-DM 100-10 MG/5ML PO SYRP
10.0000 mL | ORAL_SOLUTION | ORAL | Status: DC | PRN
  Filled 2020-06-12: qty 10

## 2020-06-12 MED ORDER — VITAMIN D 25 MCG (1000 UNIT) PO TABS
1000.0000 [IU] | ORAL_TABLET | Freq: Every day | ORAL | Status: DC
  Administered 2020-06-12 – 2020-06-19 (×8): 1000 [IU] via ORAL
  Filled 2020-06-12 (×7): qty 1

## 2020-06-12 MED ORDER — LACTATED RINGERS IV SOLN: INTRAVENOUS | Status: DC

## 2020-06-12 MED ORDER — LEVOTHYROXINE SODIUM 88 MCG PO TABS
88.0000 ug | ORAL_TABLET | Freq: Every day | ORAL | Status: DC
  Administered 2020-06-12 – 2020-06-19 (×8): 88 ug via ORAL
  Filled 2020-06-12 (×10): qty 1

## 2020-06-12 MED ORDER — VITAMIN B-6 50 MG PO TABS
100.0000 mg | ORAL_TABLET | Freq: Every day | ORAL | Status: DC
  Administered 2020-06-12 – 2020-06-19 (×8): 100 mg via ORAL
  Filled 2020-06-12 (×8): qty 2

## 2020-06-12 MED ORDER — ENOXAPARIN SODIUM 40 MG/0.4ML ~~LOC~~ SOLN: 40.0000 mg | SUBCUTANEOUS | Status: DC

## 2020-06-12 MED ORDER — METHYLPREDNISOLONE SODIUM SUCC 125 MG IJ SOLR
125.0000 mg | Freq: Once | INTRAMUSCULAR | Status: AC
  Administered 2020-06-12: 125 mg via INTRAVENOUS
  Filled 2020-06-12: qty 2

## 2020-06-12 MED ORDER — INSULIN GLARGINE 100 UNIT/ML ~~LOC~~ SOLN
35.0000 [IU] | Freq: Every day | SUBCUTANEOUS | Status: DC
  Administered 2020-06-12: 35 [IU] via SUBCUTANEOUS
  Filled 2020-06-12 (×2): qty 0.35

## 2020-06-12 MED ORDER — VITAMIN E 180 MG (400 UNIT) PO CAPS
800.0000 [IU] | ORAL_CAPSULE | Freq: Every day | ORAL | Status: DC
  Administered 2020-06-12 – 2020-06-19 (×8): 800 [IU] via ORAL
  Filled 2020-06-12 (×8): qty 2

## 2020-06-12 MED ORDER — SODIUM CHLORIDE 0.9 % IV SOLN
200.0000 mg | Freq: Once | INTRAVENOUS | Status: AC
  Administered 2020-06-12: 200 mg via INTRAVENOUS
  Filled 2020-06-12: qty 40

## 2020-06-12 NOTE — ED Notes (Signed)
In and out performed with Myra NT

## 2020-06-12 NOTE — Discharge Instructions (Signed)

## 2020-06-12 NOTE — ED Notes (Signed)
CBG 205 

## 2020-06-12 NOTE — Progress Notes (Signed)
Inpatient Diabetes Program Recommendations  AACE/ADA: New Consensus Statement on Inpatient Glycemic Control (2015)  Target Ranges:  Prepandial:   less than 140 mg/dL      Peak postprandial:   less than 180 mg/dL (1-2 hours)      Critically ill patients:  140 - 180 mg/dL   Lab Results  Component Value Date   GLUCAP 442 (H) 06/12/2020   HGBA1C 9.0 (H) 12/01/2013    Review of Glycemic Control Results for EBON, KETCHUM (MRN [id number]) as of 06/12/2020 10:08  Ref. Range 06/11/2020 16:45 06/12/2020 08:14  Glucose-Capillary Latest Ref Range: 70 - 99 mg/dL 956 (H) 213 (H)   Diabetes history: DM2 Outpatient Diabetes medications: Basaglar 70 units qd + Humalog 15 units ac breakfast + 24 units ac supper + Metformin 500 mg qd Current orders for Inpatient glycemic control: Lantus 35 units + Novolog 0-5 units  Inpatient Diabetes Program Recommendations:   -Add Novolog 5 units tid meal coverage if eats >50% meal  Thank you, Darel Hong E. Lenton Gendreau, RN, MSN, CDE  Diabetes Coordinator Inpatient Glycemic Control Team Team Pager [phone] (8am-5pm) 06/12/2020 10:12 AM

## 2020-06-12 NOTE — H&P (Signed)
[facility]   PATIENT NAME: Cory [name]    MR#:  [id number]  DATE OF BIRTH:  [birth year]  DATE OF ADMISSION:  06/12/2020  PRIMARY CARE PHYSICIAN: Rafael BihariWalker, [name], MD   REQUESTING/REFERRING PHYSICIAN: Cecil CobbsVeronese, Caroline, MD CHIEF COMPLAINT:   Chief Complaint  Patient presents with   Weakness    HISTORY OF PRESENT ILLNESS:  Cory [name]  is a 86 y.o. Caucasian male with a known history of type II diabetes mellitus, obstructive sleep apnea and hypothyroidism, presented to the emergency room with acute onset of generalized weakness with associated dry cough as well as significant diminished p.o. intake over the last 5 to 7 weeks.  The patient was unable to get up onto EMS stretcher at his primary care physician's office without significant assistance.  It was very somnolent during my interview and therefore no history could be obtained from him.  He has been having abdominal pain dysuria.  He denied any fever or chills.  No chest pain or dyspnea or palpitations.  No nausea or vomiting or diarrhea.  No loss of taste or smell.  He has been vaccinated against COVID-19.  When he came to the ER, vital signs showed a pulse symmetry 94 to 99% initially and later dropped to 90% and respiratory rate 18 and later 24.  Pulse oximetry was up to 95% on 2 L of O2 by nasal cannula.  Labs revealed ABG with pH 7.36 and HCO3 29.9 with PCO2 53.  CMP revealed hyponatremia and hypochloremia and hyperglycemia with a BUN of 40 and creatinine 1.69 close to previous creatinine.  Albumin of 3.3 with total protein 7.8.  High-sensitivity troponin I was 15 and CBC was unremarkable.  Influenza AMB antigen ischemic negative and COVID-19 PCR came back positive.  UA showed more than 500 glucose.  EKG showed atrial fibrillation with controlled ventricular sponsor 75 with left axis deviation, right bundle branch block.  This is apparently of new onset.  The patient was given 1 L bolus of IV lactated Ringer as well as 125  mg IV Solu-Medrol and was ordered IV remdesivir.  He will be admitted to a medically monitored bed for further evaluation and management. PAST MEDICAL HISTORY:   Past Medical History:  Diagnosis Date   Diabetes mellitus without complication (HCC)    OSA (obstructive sleep apnea)    Thyroid disease     PAST SURGICAL HISTORY:  History reviewed. No pertinent surgical history.  Unobtainable due to the patient's somnolence and mildly altered mental status.  SOCIAL HISTORY:   Social History   Tobacco Use   Smoking status: Never Smoker   Smokeless tobacco: Never Used  Substance Use Topics   Alcohol use: Yes    FAMILY HISTORY:  History reviewed. No pertinent family history.  Unobtainable as the patient was very somnolent and not answering questions. DRUG ALLERGIES:  No Known Allergies  REVIEW OF SYSTEMS:   ROS As per history of present illness. All pertinent systems were reviewed above. Constitutional, HEENT, cardiovascular, respiratory, GI, GU, musculoskeletal, neuro, psychiatric, endocrine, integumentary and hematologic systems were reviewed and are otherwise negative/unremarkable except for positive findings mentioned above in the HPI.   MEDICATIONS AT HOME:   Prior to Admission medications   Medication Sig Start Date End Date Taking? Authorizing Provider  aspirin 81 MG EC tablet Take 1 tablet by mouth daily.   Yes [provider]  co-enzyme Q-10 30 MG capsule Take 30 mg by mouth daily.   Yes  [provider]  cyanocobalamin 1000 MCG tablet Take 1,000 mg by mouth daily.   Yes [provider]  gabapentin (NEURONTIN) 300 MG capsule Take 300 mg by mouth 3 (three) times daily. 05/23/20  Yes [provider]  HUMALOG KWIKPEN 100 UNIT/ML KiwkPen Inject 25-30 Units into the skin 2 (two) times daily. 15 units before breakfast and 24 units before supper. 08/18/16  Yes [provider]  hydrochlorothiazide (HYDRODIURIL) 12.5 MG tablet Take  12.5 mg by mouth daily. 05/19/20  Yes [provider]  Insulin Glargine (BASAGLAR KWIKPEN) 100 UNIT/ML Inject 70 Units into the skin daily.  04/18/20 04/18/21 Yes [provider]  metFORMIN (GLUCOPHAGE-XR) 500 MG 24 hr tablet Take 500 mg by mouth daily with supper.  05/22/20  Yes [provider]  pyridoxine (B-6) 100 MG tablet Take 100 mg by mouth daily.   Yes [provider]  quinapril (ACCUPRIL) 20 MG tablet Take 1 tablet by mouth daily. 09/21/16  Yes [provider]  simvastatin (ZOCOR) 40 MG tablet Take 1 tablet by mouth at bedtime.  09/26/16  Yes [provider]  SYNTHROID 88 MCG tablet Take 1 tablet by mouth daily before breakfast.  09/26/16  Yes [provider]  tamsulosin (FLOMAX) 0.4 MG CAPS capsule Take 1 capsule by mouth daily. 07/29/16  Yes [provider]  traMADol (ULTRAM) 50 MG tablet Take 50 mg by mouth every 6 (six) hours as needed.   Yes [provider]  vitamin E 1000 UNIT capsule Take 1,000 Units by mouth daily.   Yes [provider]  diclofenac sodium (VOLTAREN) 1 % GEL Apply 4 g topically 4 (four) times daily as needed (pain). Patient not taking: Reported on 06/12/2020 07/27/18   Merrily Brittle, MD  lidocaine (LIDODERM) 5 % Place 1 patch onto the skin daily. Remove & Discard patch within 12 hours or as directed by MD Patient not taking: Reported on 06/12/2020 10/20/16   Enedina Finner, MD  loperamide (IMODIUM) 2 MG capsule Take 2 capsules (4 mg total) by mouth once. Patient not taking: Reported on 06/12/2020 09/03/15   Rockne Menghini, MD  methocarbamol (ROBAXIN) 500 MG tablet Take 1 tablet (500 mg total) by mouth every 8 (eight) hours. Patient not taking: Reported on 06/12/2020 10/20/16   Enedina Finner, MD  ondansetron (ZOFRAN ODT) 4 MG disintegrating tablet Take 1 tablet (4 mg total) by mouth every 8 (eight) hours as needed for nausea or vomiting. Patient not taking: Reported on 06/12/2020  09/03/15   Rockne Menghini, MD      VITAL SIGNS:  Blood pressure (!) 103/53, pulse 71, temperature 98.7 F (37.1 C), temperature source Oral, resp. rate (!) 24, height 5\' 8"  (1.727 m), weight 81.6 kg, SpO2 96 %.  PHYSICAL EXAMINATION:  Physical Exam  GENERAL:  86 y.o.-year-old Caucasian male patient lying in the bed with no acute distress.  He is very somnolent and difficult to arouse EYES: Pupils equal, round, reactive to light and accommodation. No scleral icterus. Extraocular muscles intact.  HEENT: Head atraumatic, normocephalic. Oropharynx and nasopharynx clear.  NECK:  Supple, no jugular venous distention. No thyroid enlargement, no tenderness.  LUNGS: Slightly diminished bibasal breath sounds however with poor expiratory effort. CARDIOVASCULAR: Regular rate and rhythm, S1, S2 normal. No murmurs, rubs, or gallops.  ABDOMEN: Soft, nondistended, nontender. Bowel sounds present. No organomegaly or mass.  EXTREMITIES: No pedal edema, cyanosis, or clubbing.  NEUROLOGIC: Cranial nerves II through XII are intact. Muscle strength 5/5 in all extremities. Sensation intact.  Gait not checked.  PSYCHIATRIC: The patient is alert and oriented x 3.  Normal affect and good eye contact. SKIN: No obvious rash, lesion, or ulcer.   LABORATORY PANEL:   CBC Recent Labs  Lab 06/11/20 1651  WBC 7.3  HGB 16.2  HCT 45.7  PLT 281   ------------------------------------------------------------------------------------------------------------------  Chemistries  Recent Labs  Lab 06/11/20 1651  NA 126*  K 4.7  CL 88*  CO2 22  GLUCOSE 391*  BUN 40*  CREATININE 1.69*  CALCIUM 9.3  MG 2.2  AST 47*  ALT 42  ALKPHOS 71  BILITOT 1.2   ------------------------------------------------------------------------------------------------------------------  Cardiac Enzymes No results for input(s): TROPONINI in the last 168  hours. ------------------------------------------------------------------------------------------------------------------  RADIOLOGY:  CT ABDOMEN PELVIS W CONTRAST  Result Date: 06/12/2020 CLINICAL DATA:  Abdominal pain EXAM: CT ABDOMEN AND PELVIS WITH CONTRAST TECHNIQUE: Multidetector CT imaging of the abdomen and pelvis was performed using the standard protocol following bolus administration of intravenous contrast. CONTRAST:  97mL OMNIPAQUE IOHEXOL 300 MG/ML  SOLN COMPARISON:  None. FINDINGS: LOWER CHEST: Multifocal bibasilar ground-glass opacity. HEPATOBILIARY: Normal hepatic contours. No intra- or extrahepatic biliary dilatation. Normal gallbladder. PANCREAS: Normal pancreas. No ductal dilatation or peripancreatic fluid collection. SPLEEN: Normal. ADRENALS/URINARY TRACT: The adrenal glands are normal. No hydronephrosis, nephroureterolithiasis or solid renal mass. The urinary bladder is normal for degree of distention STOMACH/BOWEL: There is no hiatal hernia. Normal duodenal course and caliber. No small bowel dilatation or inflammation. No focal colonic abnormality. Normal appendix. Calcified focus in the right upper quadrant. VASCULAR/LYMPHATIC: There is calcific atherosclerosis of the abdominal aorta. No lymphadenopathy. REPRODUCTIVE: There are calcifications within the normal-sized prostate. Symmetric seminal vesicles. MUSCULOSKELETAL. Multilevel degenerative disc disease and facet arthrosis. No bony spinal canal stenosis. OTHER: None. IMPRESSION: 1. No acute abnormality of the abdomen or pelvis. 2. Multifocal bibasilar ground-glass opacity, concerning for infection or aspiration. Aortic Atherosclerosis (ICD10-I70.0). Electronically Signed   By: Deatra Robinson M.D.   On: 06/12/2020 02:26   DG Chest Port 1 View  Result Date: 06/12/2020 CLINICAL DATA:  Generalized weakness with cough EXAM: PORTABLE CHEST 1 VIEW COMPARISON:  11/06/2013 FINDINGS: Streaky atelectasis at the left base. No consolidation or  effusion. Stable cardiomediastinal silhouette. No pneumothorax. IMPRESSION: Streaky atelectasis at the left base. Electronically Signed   By: Jasmine Pang M.D.   On: 06/12/2020 02:04      IMPRESSION AND PLAN:   1.  Acute hypoxemic respiratory failure secondary to COVID-19. -The patient will be admitted to a medically monitored isolation bed. -O2 protocol will be followed to keep O2 saturation above 93.   2.  Multifocal pneumonia secondary to COVID-19.  The patient has hyponatremia likely hypovolemic from volume depletion and anorexia. -The patient will be admitted to an isolation monitored bed with droplet and contact precautions. -Given multifocal pneumonia we will empirically place the patient on IV Rocephin and Zithromax for possible bacterial superinfection only with elevated Procalcitonin. -The patient will be placed on scheduled Mucinex and as needed Tussionex. -We will avoid nebulization as much as we can, give bronchodilator MDI if needed, and with deterioration of oxygenation try to avoid BiPAP/CPAP if possible.    -Will obtain sputum Gram stain culture and sensitivity and follow blood cultures. -O2 protocol will be followed. -We will follow CRP, ferritin, LDH and D-dimer. -Will follow manual differential for ANC/ALC ratio as well as follow troponin I and daily CBC with manual differential and CMP. - Will place the patient on IV Remdesivir and IV steroid therapy with Decadron with  elevated inflammatory markers. -The patient will be placed on vitamin D3, vitamin C, zinc sulfate, p.o. Pepcid and aspirin. -We will start him on baricitinib.  3.  Atrial fibrillation with controlled ventricular response. -The patient will be placed on Eliquis.  4.  Type 2 diabetes mellitus with peripheral neuropathy. -We will continue basal coverage and place him on supplement coverage with NovoLog. -We will continue Neurontin.  5.  BPH. -We will continue Flomax.  6.  Hypothyroidism. -We will  continue Synthroid and check TSH level.  7.  Hypertension. -We will continue Accupril.  8.  Dyslipidemia. -We will continue statin therapy.  9.  DVT prophylaxis. -The patient will be on Eliquis.  All the records are reviewed and case discussed with ED provider. The plan of care was discussed in details with the patient (and family). I answered all questions. The patient agreed to proceed with the above mentioned plan. Further management will depend upon hospital course.   CODE STATUS: Full code  Status is: Inpatient  Remains inpatient appropriate because:Altered mental status, Ongoing diagnostic testing needed not appropriate for outpatient work up, Unsafe d/c plan, IV treatments appropriate due to intensity of illness or inability to take PO and Inpatient level of care appropriate due to severity of illness   Dispo: The patient is from: Home              Anticipated d/c is to: Home              Anticipated d/c date is: 3 days              Patient currently is not medically stable to d/c.   TOTAL TIME TAKING CARE OF THIS PATIENT: 55 minutes.    Hannah Beat M.D on 06/12/2020 at 7:31 AM  [facility]   From 7 PM-7 AM, contact night-coverage [email or web address]  CC: Primary care physician; Rafael Bihari, MD   Note: This dictation was prepared with Dragon dictation along with smaller phrase technology. Any transcriptional typo errors that result from this process are unintentional.

## 2020-06-12 NOTE — ED Notes (Signed)
MD paged about soft pressures and CBG >600. Orders for incr insulin and BMP

## 2020-06-12 NOTE — ED Notes (Signed)
See triage note. Pt oriented to person only. When asked "what brings you here today" pt states "because God made me".  Pt c/o generic abdominal pain. NAD noted

## 2020-06-12 NOTE — Progress Notes (Signed)
Remdesivir - Pharmacy Brief Note     A/P:  Remdesivir 200 mg IVPB once followed by 100 mg IVPB daily x 4 days.   Valrie Hart, PharmD Clinical Pharmacist   06/12/2020 3:37 AM

## 2020-06-12 NOTE — ED Provider Notes (Signed)
[facility] Emergency Department Provider Note  ____________________________________________  Time seen: Approximately 1:30 AM  I have reviewed the triage vital signs and the nursing notes.   HISTORY  Chief Complaint Weakness  Level 5 caveat:  Portions of the history and physical were unable to be obtained due to confusion and poor historian   HPI Cory [name] is a 86 y.o. male with a history of diabetes, OSA, hypothyroidism who presents via EMS from his primary care doctor's office for generalized weakness.   Patient went to PCPs office complaining of minimal p.o. intake for the past 5 to 7 weeks and generalized weakness.  Patient was unable to get up onto EMS stretcher at the doctor's office without significant assistance.  Patient is a very poor historian but is complaining of abdominal pain, dysuria and cough.  He reports that he started feeling sick today.  He denies any fever, chest pain, shortness of breath, vomiting or diarrhea.  Past Medical History:  Diagnosis Date  . Diabetes mellitus without complication (HCC)   . OSA (obstructive sleep apnea)   . Thyroid disease     Patient Active Problem List   Diagnosis Date Noted  . Intractable pain 10/17/2016    History reviewed. No pertinent surgical history.  Prior to Admission medications   Medication Sig Start Date End Date Taking? Authorizing Provider  co-enzyme Q-10 30 MG capsule Take 30 mg by mouth daily.    [provider]  diclofenac sodium (VOLTAREN) 1 % GEL Apply 4 g topically 4 (four) times daily as needed (pain). 07/27/18   Merrily Brittle, MD  HUMALOG KWIKPEN 100 UNIT/ML KiwkPen Inject 25-30 Units into the skin 2 (two) times daily. 25 units with lunch and 30 units at supper. 08/18/16   [provider]  LANTUS SOLOSTAR 100 UNIT/ML Solostar Pen Inject 45 Units into the skin daily at 10 pm.  08/18/16   [provider]  lidocaine (LIDODERM) 5 % Place 1 patch onto  the skin daily. Remove & Discard patch within 12 hours or as directed by MD 10/20/16   Enedina Finner, MD  loperamide (IMODIUM) 2 MG capsule Take 2 capsules (4 mg total) by mouth once. 09/03/15   Rockne Menghini, MD  methocarbamol (ROBAXIN) 500 MG tablet Take 1 tablet (500 mg total) by mouth every 8 (eight) hours. 10/20/16   Enedina Finner, MD  ondansetron (ZOFRAN ODT) 4 MG disintegrating tablet Take 1 tablet (4 mg total) by mouth every 8 (eight) hours as needed for nausea or vomiting. 09/03/15   Rockne Menghini, MD  pyridoxine (B-6) 100 MG tablet Take 100 mg by mouth daily.    [provider]  quinapril (ACCUPRIL) 20 MG tablet Take 1 tablet by mouth daily. 09/21/16   [provider]  simvastatin (ZOCOR) 40 MG tablet Take 1 tablet by mouth daily. 09/26/16   [provider]  SYNTHROID 50 MCG tablet Take 1 tablet by mouth daily. 09/26/16   [provider]  tamsulosin (FLOMAX) 0.4 MG CAPS capsule Take 1 capsule by mouth daily. 07/29/16   [provider]  traMADol (ULTRAM) 50 MG tablet Take 50 mg by mouth every 6 (six) hours as needed.    [provider]    Allergies Patient has no known allergies.  History reviewed. No pertinent family history.  Social History Social History   Tobacco Use  . Smoking status: Never Smoker  . Smokeless tobacco: Never Used  Substance Use Topics  . Alcohol use: Yes  .  Drug use: Not on file    Review of Systems  Constitutional: Negative for fever. + Generalized weakness ENT: Negative for sore throat. Neck: No neck pain  Cardiovascular: Negative for chest pain. Respiratory: Negative for shortness of breath. + cough Gastrointestinal: + abdominal pain. No vomiting or diarrhea. Genitourinary: + dysuria. Musculoskeletal: Negative for back pain. Skin: Negative for rash. Neurological: Negative for headaches, weakness or numbness. Psych: No SI or  HI  ____________________________________________   PHYSICAL EXAM:  VITAL SIGNS: ED Triage Vitals  Enc Vitals Group     BP 06/11/20 1637 118/72     Pulse Rate 06/11/20 1637 84     Resp 06/11/20 1637 18     Temp 06/11/20 1639 97.8 F (36.6 C)     Temp Source 06/11/20 1639 Axillary     SpO2 06/11/20 1637 94 %     Weight 06/11/20 1637 180 lb (81.6 kg)     Height 06/11/20 1637 5\' 8"  (1.727 m)     Head Circumference --      Peak Flow --      Pain Score 06/11/20 1637 0     Pain Loc --      Pain Edu? --      Excl. in GC? --     Constitutional: Alert and oriented, looks dry, confused, no distress.  HEENT:      Head: Normocephalic and atraumatic.         Eyes: Conjunctivae are normal. Sclera is non-icteric.       Mouth/Throat: Mucous membranes are dry.       Neck: Supple with no signs of meningismus. Cardiovascular: Regular rate and rhythm. No murmurs, gallops, or rubs. 2+ symmetrical distal pulses are present in all extremities. No JVD. Respiratory: Normal respiratory effort. Lungs are clear to auscultation bilaterally. No wheezes, crackles, or rhonchi.  Gastrointestinal: Soft, diffusely tender to palpation with no distention, rebound or guarding. Musculoskeletal: No edema, cyanosis, or erythema of extremities. Neurologic: Normal speech and language. Face is symmetric. Moving all extremities. No gross focal neurologic deficits are appreciated. Skin: Skin is warm, dry and intact. No rash noted. Psychiatric: Mood and affect are normal. Speech and behavior are normal.  ____________________________________________   LABS (all labs ordered are listed, but only abnormal results are displayed)  Labs Reviewed  RESPIRATORY PANEL BY RT PCR (FLU A&B, COVID) - Abnormal; Notable for the following components:      Result Value   SARS Coronavirus 2 by RT PCR POSITIVE (*)    All other components within normal limits  URINALYSIS, COMPLETE (UACMP) WITH MICROSCOPIC - Abnormal; Notable for the  following components:   Color, Urine YELLOW (*)    APPearance HAZY (*)    Glucose, UA >=500 (*)    All other components within normal limits  COMPREHENSIVE METABOLIC PANEL - Abnormal; Notable for the following components:   Sodium 126 (*)    Chloride 88 (*)    Glucose, Bld 391 (*)    BUN 40 (*)    Creatinine, Ser 1.69 (*)    Albumin 3.3 (*)    AST 47 (*)    GFR calc non Af Amer 36 (*)    GFR calc Af Amer 41 (*)    Anion gap 16 (*)    All other components within normal limits  GLUCOSE, CAPILLARY - Abnormal; Notable for the following components:   Glucose-Capillary 398 (*)    All other components within normal limits  BLOOD GAS, VENOUS - Abnormal; Notable for the  following components:   pO2, Ven <31.0 (*)    Bicarbonate 29.9 (*)    Acid-Base Excess 2.9 (*)    All other components within normal limits  CBC  MAGNESIUM  TROPONIN I (HIGH SENSITIVITY)  TROPONIN I (HIGH SENSITIVITY)   ____________________________________________  EKG  ED ECG REPORT I, Nita Sickle, the attending physician, personally viewed and interpreted this ECG.  Atrial fibrillation, rate of 75, right bundle branch block, normal QTC, left axis deviation, no ST elevations or depressions.  A. fib is new when compared to prior ____________________________________________  RADIOLOGY  I have personally reviewed the images performed during this visit and I agree with the Radiologist's read.   Interpretation by Radiologist:  CT ABDOMEN PELVIS W CONTRAST  Result Date: 06/12/2020 CLINICAL DATA:  Abdominal pain EXAM: CT ABDOMEN AND PELVIS WITH CONTRAST TECHNIQUE: Multidetector CT imaging of the abdomen and pelvis was performed using the standard protocol following bolus administration of intravenous contrast. CONTRAST:  50mL OMNIPAQUE IOHEXOL 300 MG/ML  SOLN COMPARISON:  None. FINDINGS: LOWER CHEST: Multifocal bibasilar ground-glass opacity. HEPATOBILIARY: Normal hepatic contours. No intra- or extrahepatic  biliary dilatation. Normal gallbladder. PANCREAS: Normal pancreas. No ductal dilatation or peripancreatic fluid collection. SPLEEN: Normal. ADRENALS/URINARY TRACT: The adrenal glands are normal. No hydronephrosis, nephroureterolithiasis or solid renal mass. The urinary bladder is normal for degree of distention STOMACH/BOWEL: There is no hiatal hernia. Normal duodenal course and caliber. No small bowel dilatation or inflammation. No focal colonic abnormality. Normal appendix. Calcified focus in the right upper quadrant. VASCULAR/LYMPHATIC: There is calcific atherosclerosis of the abdominal aorta. No lymphadenopathy. REPRODUCTIVE: There are calcifications within the normal-sized prostate. Symmetric seminal vesicles. MUSCULOSKELETAL. Multilevel degenerative disc disease and facet arthrosis. No bony spinal canal stenosis. OTHER: None. IMPRESSION: 1. No acute abnormality of the abdomen or pelvis. 2. Multifocal bibasilar ground-glass opacity, concerning for infection or aspiration. Aortic Atherosclerosis (ICD10-I70.0). Electronically Signed   By: Deatra Robinson M.D.   On: 06/12/2020 02:26   DG Chest Port 1 View  Result Date: 06/12/2020 CLINICAL DATA:  Generalized weakness with cough EXAM: PORTABLE CHEST 1 VIEW COMPARISON:  11/06/2013 FINDINGS: Streaky atelectasis at the left base. No consolidation or effusion. Stable cardiomediastinal silhouette. No pneumothorax. IMPRESSION: Streaky atelectasis at the left base. Electronically Signed   By: Jasmine Pang M.D.   On: 06/12/2020 02:04      ____________________________________________   PROCEDURES  Procedure(s) performed:yes .1-3 Lead EKG Interpretation Performed by: Nita Sickle, MD Authorized by: Nita Sickle, MD     Interpretation: non-specific     ECG rate assessment: normal     Rhythm: atrial fibrillation     Ectopy: PVCs     Critical Care performed: yes  CRITICAL CARE Performed by: Nita Sickle  ?  Total critical care time:  40 min  Critical care time was exclusive of separately billable procedures and treating other patients.  Critical care was necessary to treat or prevent imminent or life-threatening deterioration.  Critical care was time spent personally by me on the following activities: development of treatment plan with patient and/or surrogate as well as nursing, discussions with consultants, evaluation of patient's response to treatment, examination of patient, obtaining history from patient or surrogate, ordering and performing treatments and interventions, ordering and review of laboratory studies, ordering and review of radiographic studies, pulse oximetry and re-evaluation of patient's condition.  ____________________________________________   INITIAL IMPRESSION / ASSESSMENT AND PLAN / ED COURSE   86 y.o. male with a history of diabetes, OSA, hypothyroidism who presents via EMS from  his primary care doctor's office for generalized weakness, cough, abdominal pain, dysuria.  Patient looks extremely dry on exam.  Slightly hypotensive with BP of 105/65 but no tachycardia or fever.  Abdomen is soft and nondistended with diffuse tenderness but no rebound or guarding.  Normal work of breathing and normal sats.  Ddx DKA, UTI, diverticulitis, appendicitis, gallbladder pathology, kidney stone, pyelonephritis, PNA, COVID, dehydration, AKI, anemia, electrolyte derangements.  Labs showing hyperglycemia with glucose of 391 and anion gap of 16. VBG pending to rule out DKA.  EKG showing new A. fib with well-controlled rate.  Electrolytes within normal limits.  2 high-sensitivity troponin negative.  Patient has no leukocytosis, no anemia.  Flu and Covid pending.  UA pending.  We will get a chest x-ray and CT abdomen pelvis.  Patient started on IVF. Old medical records reviewed.  Patient placed on telemetry for close monitoring.  _________________________ 2:39 AM on  06/12/2020 -----------------------------------------  CT abdomen pelvis with no pathology in the abdomen but does show multifocal pneumonia on lung bases.  Patient is Covid positive.  VBG with no signs of DKA.  Patient has no hypoxia with normal work of breathing but he is extremely weak and unable to stand or ambulate without any assistance.  Will discuss with the hospitalist for admission.      _____________________________________________ Please note:  Patient was evaluated in Emergency Department today for the symptoms described in the history of present illness. Patient was evaluated in the context of the global COVID-19 pandemic, which necessitated consideration that the patient might be at risk for infection with the SARS-CoV-2 virus that causes COVID-19. Institutional protocols and algorithms that pertain to the evaluation of patients at risk for COVID-19 are in a state of rapid change based on information released by regulatory bodies including the CDC and federal and state organizations. These policies and algorithms were followed during the patient's care in the ED.  Some ED evaluations and interventions may be delayed as a result of limited staffing during the pandemic.   [place] Controlled Substance Database was reviewed by me. ____________________________________________   FINAL CLINICAL IMPRESSION(S) / ED DIAGNOSES   Final diagnoses:  Generalized weakness  New onset a-fib (HCC)  COVID-19      NEW MEDICATIONS STARTED DURING THIS VISIT:  ED Discharge Orders    None       Note:  This document was prepared using Dragon voice recognition software and may include unintentional dictation errors.    Don PerkingVeronese, [place], MD 06/12/20 [phone]

## 2020-06-12 NOTE — ED Notes (Signed)
Pt placed on 2L [place], SpO2 high 80s. Dr Don Perking notified

## 2020-06-12 NOTE — Progress Notes (Signed)
PHARMACIST - PHYSICIAN ORDER COMMUNICATION  CONCERNING: P&T Medication Policy on Herbal Medications  DESCRIPTION:  This patient's order for:  co-enzyme Q-10 capsule 30 mg  has been noted.  This product(s) is classified as an "herbal" or natural product. Due to a lack of definitive safety studies or FDA approval, nonstandard manufacturing practices, plus the potential risk of unknown drug-drug interactions while on inpatient medications, the Pharmacy and Therapeutics Committee does not permit the use of "herbal" or natural products of this type within [facility].   ACTION TAKEN: The pharmacy department is unable to verify this order at this time and your patient has been informed of this safety policy. Please reevaluate patient's clinical condition at discharge and address if the herbal or natural product(s) should be resumed at that time.  Gardner Candle, PharmD, BCPS Clinical Pharmacist 06/12/2020 7:24 AM

## 2020-06-12 NOTE — ED Notes (Signed)
Pt's daughter, [name], expressed concerns over increased ADL needs of pt while at home. Wants to see if case management or home health can help at home. MD notified

## 2020-06-12 NOTE — Progress Notes (Signed)
This is a brief hospitalist update note.  Same day note.  See please see same day H&P for full billable details.  Briefly this is a 86 year old male with known history of type 2 diabetes mellitus, obstructive sleep apnea, hypothyroidism, fully vaccinated for Covid who presented to the ER for acute onset generalized weakness associated with dry cough.  Tested positive for COVID-19.  Has been started on Covid specific treatments.  Patient has had markedly elevated blood sugars since admission.  Blood sugars continue to be elevated despite aggressive replacement of subcutaneous insulin.  Presentation not consistent with HHS.  Will hold all subcutaneous insulin and initiate IV insulin via Endo tool protocol.  Covid treatment to be continued.  Admission status upgraded to stepdown unit.  Called to update stepdaughter [name] via phone [phone]  Lolita Patella MD

## 2020-06-12 NOTE — TOC Initial Note (Addendum)
Transition of Care [facility]) - Initial/Assessment Note    Patient Details  Name: Cory [name] MRN: [id number] Date of Birth: 15-Aug-1931  [facility] CM/SW Contact:    Cory Cellar, RN Phone Number: 06/12/2020, 12:45 PM  Clinical Narrative:                 Spoke with daughter, Cory [name] via telephone. Cory [name] states patient lives home with his significant other-Cory [name] whom he refers to as his wife. Patient has had gradual decline in condition over the last couple of weeks with multiple falls requiring EMS to assist. Daughter states patient has been advised to get a shorter bed as most falls occur getting out of bed however he refuses. Family is working with "[facility]" for possible assistance at home once patient has completed short term rehab for strengthening. Family is very involved and supportive of patients care however remain concerned about stress of caregiving on patients significant other. Family is requesting [employer] operational [employer] for SNF however understand that due to COVID (+)  Bed offers will be limited. Patient is alert and oriented at baseline. PASRR completed [id number] A.   Expected Discharge Plan: Skilled Nursing Facility Barriers to Discharge: Continued Medical Work up   Patient Goals and CMS Choice Patient states their goals for this hospitalization and ongoing recovery are:: Seeking SNF placement CMS Medicare.gov Compare Post Acute Care list provided to:: Patient Represenative (must comment) Choice offered to / list presented to : Adult Children Nurse, children's)  Expected Discharge Plan and Services Expected Discharge Plan: Skilled Nursing Facility       Living arrangements for the past 2 months: Single Family Home                                      Prior Living Arrangements/Services Living arrangements for the past 2 months: Single Family Home Lives with:: Spouse Patient language and need for interpreter reviewed:: Yes Do you feel  safe going back to the place where you live?: No   seeking short term SNF placement  Need for Family Participation in Patient Care: Yes (Comment) Care giver support system in place?: Yes (comment)   Criminal Activity/Legal Involvement Pertinent to Current Situation/Hospitalization: No - Comment as needed  Activities of Daily Living      Permission Sought/Granted Permission sought to share information with : Family Supports Permission granted to share information with : Yes, Verbal Permission Granted  Share Information with NAME: Cory [name]-Daughter, Cory [name]-Significant Other           Emotional Assessment Appearance:: Appears stated age Attitude/Demeanor/Rapport: Unable to Assess Affect (typically observed): Unable to Assess Orientation: : Oriented to Self, Oriented to Place, Oriented to  Time, Oriented to Situation Alcohol / Substance Use: Not Applicable Psych Involvement: No (comment)  Admission diagnosis:  Pneumonia due to COVID-19 virus [U07.1, J12.82] Patient Active Problem List   Diagnosis Date Noted  . Pneumonia due to COVID-19 virus 06/12/2020  . Intractable pain 10/17/2016   PCP:  Cory Bihari, MD Pharmacy:   [facility] DELIVERY - [address], [place] - [address] [place] [address] Phone: [phone] Fax: [phone]     Social Determinants of Health (SDOH) Interventions    Readmission Risk Interventions No flowsheet data found.

## 2020-06-12 NOTE — ED Notes (Signed)
Pt ate about 25% of breakfast.

## 2020-06-12 NOTE — ED Notes (Signed)
Daughter updated to the best of this RN ability 

## 2020-06-12 NOTE — ED Notes (Signed)
Dr Don Perking notified of positive COVID

## 2020-06-12 NOTE — Evaluation (Signed)
Physical Therapy Evaluation Patient Details Name: Cory [name] MRN: [id number] DOB: 1931-06-15 Today's Date: 06/12/2020   History of Present Illness   86 y.o. Caucasian male with a known history of type II diabetes mellitus, obstructive sleep apnea and hypothyroidism, presented to the emergency room with acute onset of generalized weakness with associated dry cough.  Pt tested positive for Covid 19, has been vaccinated.  Clinical Impression  Pt on 2L on arrival with sats in the high 90s, did drop to 80s during standing but more than likely this was due to gripping the walker causing a poor reading because on return to supine it very quickly went back up to mid 90s.  He reports he normally gets around in the home w/o AD. He states that wife runs errands, etc but that he can manage in the home relatively well.  Mixed messages as he indicated that he is largely independent in the home but does have falls (~1/month), needs assist from wife to get out of bed...  He showed good effort and willingness to participate but is very weak and struggled to even maintain sitting EOB (needing constant assist to keep from LOBs backward and at times needing very heavy assist), unable to get hips off EOB in attempt to stand with increasing fatigue and weakness while trying.  Pt clearly will not be safe to return home at this time and agrees that he will need STR.      Follow Up Recommendations SNF    Equipment Recommendations   (TBD at next venue of care)    Recommendations for Other Services       Precautions / Restrictions Precautions Precautions: Fall Restrictions Weight Bearing Restrictions: No      Mobility  Bed Mobility Overal bed mobility: Needs Assistance Bed Mobility: Supine to Sit;Sit to Supine     Supine to sit: Max assist Sit to supine: Max assist   General bed mobility comments: Pt reports that at baseline his wife typically helps him a little bit, today he required heavy assist and  could not maintain sitting balance at EOB, needing constant mod+ assist and cuing  Transfers Overall transfer level: Needs assistance Equipment used: Rolling walker (2 wheeled) Transfers: Sit to/from Stand Sit to Stand: Total assist         General transfer comment: did not attain full standing, he made good effort but it was clear that he was too weak, unsteady and too fatigued to get to upright  Ambulation/Gait                Stairs            Wheelchair Mobility    Modified Rankin (Stroke Patients Only)       Balance Overall balance assessment: Needs assistance Sitting-balance support: Bilateral upper extremity supported Sitting balance-Leahy Scale: Poor Sitting balance - Comments: constant assist, cuing and plenty of readjusting assist at EOB he could not keep from falling backward w/o heavy assist       Standing balance comment: unable to attain full standing, leaning back on bed t/o the effort                             Pertinent Vitals/Pain Pain Assessment: 0-10 Pain Score: 5  Pain Location: back pain, "from laying in bed"    Home Living Family/patient expects to be discharged to:: Skilled nursing facility  Additional Comments: Pt reports that he only rarely is out of the house (with wife running errands) but can get around in the home w/o assist and do light in-home chores, ADLs, etc    Prior Function Level of Independence: Independent with assistive device(s)         Comments: reports he uses no AD and is able to manage in the home relatively well, also reports 6+ falls in the last 6 months from "dizziness"     Hand Dominance        Extremity/Trunk Assessment   Upper Extremity Assessment Upper Extremity Assessment: Generalized weakness;Overall WFL for tasks assessed    Lower Extremity Assessment Lower Extremity Assessment: Generalized weakness (b/l LEs grossly 3+/5 in limited range)        Communication   Communication: No difficulties  Cognition Arousal/Alertness: Awake/alert Behavior During Therapy: WFL for tasks assessed/performed Overall Cognitive Status: Within Functional Limits for tasks assessed                                        General Comments      Exercises     Assessment/Plan    PT Assessment Patient needs continued PT services  PT Problem List Decreased strength;Decreased range of motion;Decreased activity tolerance;Decreased balance;Decreased mobility;Decreased knowledge of use of DME;Decreased safety awareness;Pain       PT Treatment Interventions DME instruction;Gait training;Functional mobility training;Therapeutic activities;Therapeutic exercise;Balance training;Neuromuscular re-education;Patient/family education    PT Goals (Current goals can be found in the Care Plan section)  Acute Rehab PT Goals Patient Stated Goal: get stronger PT Goal Formulation: With patient Time For Goal Achievement: 06/26/20 Potential to Achieve Goals: Fair    Frequency Min 2X/week   Barriers to discharge        Co-evaluation               AM-PAC PT "6 Clicks" Mobility  Outcome Measure Help needed turning from your back to your side while in a flat bed without using bedrails?: Total Help needed moving from lying on your back to sitting on the side of a flat bed without using bedrails?: Total Help needed moving to and from a bed to a chair (including a wheelchair)?: Total Help needed standing up from a chair using your arms (e.g., wheelchair or bedside chair)?: Total Help needed to walk in hospital room?: Total Help needed climbing 3-5 steps with a railing? : Total 6 Click Score: 6    End of Session Equipment Utilized During Treatment: Gait belt;Oxygen (2L) Activity Tolerance: Patient tolerated treatment well;No increased pain;Patient limited by fatigue;Patient limited by pain Patient left: in bed;with call bell/phone within  reach Nurse Communication: Mobility status PT Visit Diagnosis: Muscle weakness (generalized) (M62.81);Unsteadiness on feet (R26.81);Difficulty in walking, not elsewhere classified (R26.2)    Time: 1121-1150 PT Time Calculation (min) (ACUTE ONLY): 29 min   Charges:   PT Evaluation $PT Eval Low Complexity: 1 Low PT Treatments $Therapeutic Activity: 8-22 mins        Malachi Pro, DPT 06/12/2020, 2:34 PM

## 2020-06-13 ENCOUNTER — Encounter: Payer: Self-pay | Admitting: Family Medicine

## 2020-06-13 DIAGNOSIS — J9600 Acute respiratory failure, unspecified whether with hypoxia or hypercapnia: Secondary | ICD-10-CM

## 2020-06-13 LAB — BASIC METABOLIC PANEL
Anion gap: 10 (ref 5–15)
Anion gap: 11 (ref 5–15)
Anion gap: 10 (ref 5–15)
BUN: 46 mg/dL — ABNORMAL HIGH (ref 8–23)
BUN: 48 mg/dL — ABNORMAL HIGH (ref 8–23)
BUN: 45 mg/dL — ABNORMAL HIGH (ref 8–23)
CO2: 24 mmol/L (ref 22–32)
CO2: 26 mmol/L (ref 22–32)
CO2: 25 mmol/L (ref 22–32)
Calcium: 8.7 mg/dL — ABNORMAL LOW (ref 8.9–10.3)
Calcium: 8.9 mg/dL (ref 8.9–10.3)
Calcium: 8.8 mg/dL — ABNORMAL LOW (ref 8.9–10.3)
Chloride: 98 mmol/L (ref 98–111)
Chloride: 96 mmol/L — ABNORMAL LOW (ref 98–111)
Chloride: 95 mmol/L — ABNORMAL LOW (ref 98–111)
Creatinine, Ser: 1.76 mg/dL — ABNORMAL HIGH (ref 0.61–1.24)
Creatinine, Ser: 1.66 mg/dL — ABNORMAL HIGH (ref 0.61–1.24)
Creatinine, Ser: 1.86 mg/dL — ABNORMAL HIGH (ref 0.61–1.24)
GFR calc Af Amer: 39 mL/min — ABNORMAL LOW (ref >60)
GFR calc Af Amer: 42 mL/min — ABNORMAL LOW (ref >60)
GFR calc Af Amer: 37 mL/min — ABNORMAL LOW (ref >60)
GFR calc non Af Amer: 34 mL/min — ABNORMAL LOW (ref >60)
GFR calc non Af Amer: 37 mL/min — ABNORMAL LOW (ref >60)
GFR calc non Af Amer: 32 mL/min — ABNORMAL LOW (ref >60)
Glucose, Bld: 127 mg/dL — ABNORMAL HIGH (ref 70–99)
Glucose, Bld: 145 mg/dL — ABNORMAL HIGH (ref 70–99)
Glucose, Bld: 182 mg/dL — ABNORMAL HIGH (ref 70–99)
Potassium: 4.4 mmol/L (ref 3.5–5.1)
Potassium: 4.6 mmol/L (ref 3.5–5.1)
Potassium: 4.8 mmol/L (ref 3.5–5.1)
Sodium: 132 mmol/L — ABNORMAL LOW (ref 135–145)
Sodium: 133 mmol/L — ABNORMAL LOW (ref 135–145)
Sodium: 130 mmol/L — ABNORMAL LOW (ref 135–145)

## 2020-06-13 LAB — FERRITIN: Ferritin: 376 ng/mL — ABNORMAL HIGH (ref 24–336)

## 2020-06-13 LAB — COMPREHENSIVE METABOLIC PANEL
ALT: 34 U/L (ref 0–44)
AST: 40 U/L (ref 15–41)
Albumin: 3 g/dL — ABNORMAL LOW (ref 3.5–5.0)
Alkaline Phosphatase: 57 U/L (ref 38–126)
Anion gap: 10 (ref 5–15)
BUN: 47 mg/dL — ABNORMAL HIGH (ref 8–23)
CO2: 25 mmol/L (ref 22–32)
Calcium: 8.9 mg/dL (ref 8.9–10.3)
Chloride: 96 mmol/L — ABNORMAL LOW (ref 98–111)
Creatinine, Ser: 1.74 mg/dL — ABNORMAL HIGH (ref 0.61–1.24)
GFR calc Af Amer: 40 mL/min — ABNORMAL LOW (ref >60)
GFR calc non Af Amer: 34 mL/min — ABNORMAL LOW (ref >60)
Glucose, Bld: 158 mg/dL — ABNORMAL HIGH (ref 70–99)
Potassium: 4.4 mmol/L (ref 3.5–5.1)
Sodium: 131 mmol/L — ABNORMAL LOW (ref 135–145)
Total Bilirubin: 0.7 mg/dL (ref 0.3–1.2)
Total Protein: 6.7 g/dL (ref 6.5–8.1)

## 2020-06-13 LAB — CBC WITH DIFFERENTIAL/PLATELET
Abs Immature Granulocytes: 0.06 10*3/uL (ref 0.00–0.07)
Basophils Absolute: 0 10*3/uL (ref 0.0–0.1)
Basophils Relative: 0 %
Eosinophils Absolute: 0 10*3/uL (ref 0.0–0.5)
Eosinophils Relative: 0 %
HCT: 43.1 % (ref 39.0–52.0)
Hemoglobin: 15 g/dL (ref 13.0–17.0)
Immature Granulocytes: 0 %
Lymphocytes Relative: 10 %
Lymphs Abs: 1.4 10*3/uL (ref 0.7–4.0)
MCH: 30.2 pg (ref 26.0–34.0)
MCHC: 34.8 g/dL (ref 30.0–36.0)
MCV: 86.9 fL (ref 80.0–100.0)
Monocytes Absolute: 0.8 10*3/uL (ref 0.1–1.0)
Monocytes Relative: 5 %
Neutro Abs: 11.6 10*3/uL — ABNORMAL HIGH (ref 1.7–7.7)
Neutrophils Relative %: 85 %
Platelets: 279 10*3/uL (ref 150–400)
RBC: 4.96 MIL/uL (ref 4.22–5.81)
RDW: 13.3 % (ref 11.5–15.5)
WBC: 13.8 10*3/uL — ABNORMAL HIGH (ref 4.0–10.5)
nRBC: 0 % (ref 0.0–0.2)

## 2020-06-13 LAB — GLUCOSE, CAPILLARY
Glucose-Capillary: 128 mg/dL — ABNORMAL HIGH (ref 70–99)
Glucose-Capillary: 132 mg/dL — ABNORMAL HIGH (ref 70–99)
Glucose-Capillary: 154 mg/dL — ABNORMAL HIGH (ref 70–99)
Glucose-Capillary: 174 mg/dL — ABNORMAL HIGH (ref 70–99)
Glucose-Capillary: 244 mg/dL — ABNORMAL HIGH (ref 70–99)
Glucose-Capillary: 332 mg/dL — ABNORMAL HIGH (ref 70–99)
Glucose-Capillary: 384 mg/dL — ABNORMAL HIGH (ref 70–99)
Glucose-Capillary: 430 mg/dL — ABNORMAL HIGH (ref 70–99)

## 2020-06-13 LAB — OSMOLALITY: Osmolality: 297 mOsm/kg — ABNORMAL HIGH (ref 275–295)

## 2020-06-13 LAB — C-REACTIVE PROTEIN: CRP: 5.8 mg/dL — ABNORMAL HIGH (ref <1.0)

## 2020-06-13 LAB — FIBRIN DERIVATIVES D-DIMER (ARMC ONLY): Fibrin derivatives D-dimer (ARMC): >7500 ng/mL (FEU) — ABNORMAL HIGH (ref 0.00–499.00)

## 2020-06-13 MED ORDER — INSULIN GLARGINE 100 UNIT/ML ~~LOC~~ SOLN
20.0000 [IU] | Freq: Once | SUBCUTANEOUS | Status: AC
  Administered 2020-06-13: 20 [IU] via SUBCUTANEOUS
  Filled 2020-06-13: qty 0.2

## 2020-06-13 MED ORDER — INSULIN GLARGINE 100 UNIT/ML ~~LOC~~ SOLN
5.0000 [IU] | SUBCUTANEOUS | Status: DC
  Filled 2020-06-13: qty 0.05

## 2020-06-13 MED ORDER — INSULIN ASPART 100 UNIT/ML ~~LOC~~ SOLN
0.0000 [IU] | Freq: Three times a day (TID) | SUBCUTANEOUS | Status: DC
  Administered 2020-06-13: 22:00:00 15 [IU] via SUBCUTANEOUS
  Administered 2020-06-13: 11 [IU] via SUBCUTANEOUS
  Administered 2020-06-13: 5 [IU] via SUBCUTANEOUS
  Administered 2020-06-13: 3 [IU] via SUBCUTANEOUS
  Administered 2020-06-14: 17:00:00 8 [IU] via SUBCUTANEOUS
  Administered 2020-06-14 (×2): 5 [IU] via SUBCUTANEOUS
  Administered 2020-06-14: 09:00:00 11 [IU] via SUBCUTANEOUS
  Administered 2020-06-15: 8 [IU] via SUBCUTANEOUS
  Administered 2020-06-15: 18:00:00 15 [IU] via SUBCUTANEOUS
  Administered 2020-06-15: 10:00:00 3 [IU] via SUBCUTANEOUS
  Administered 2020-06-15: 11 [IU] via SUBCUTANEOUS
  Administered 2020-06-16: 09:00:00 8 [IU] via SUBCUTANEOUS
  Filled 2020-06-13 (×13): qty 1

## 2020-06-13 MED ORDER — INSULIN GLARGINE 100 UNIT/ML ~~LOC~~ SOLN
10.0000 [IU] | SUBCUTANEOUS | Status: DC
  Administered 2020-06-13: 10 [IU] via SUBCUTANEOUS
  Filled 2020-06-13: qty 0.1

## 2020-06-13 MED ORDER — SODIUM CHLORIDE 0.9 % IV SOLN: INTRAVENOUS | Status: DC

## 2020-06-13 MED ORDER — FAMOTIDINE 20 MG PO TABS
20.0000 mg | ORAL_TABLET | Freq: Every day | ORAL | Status: DC
  Administered 2020-06-14 – 2020-06-19 (×6): 20 mg via ORAL
  Filled 2020-06-13 (×6): qty 1

## 2020-06-13 MED ORDER — GABAPENTIN 100 MG PO CAPS
200.0000 mg | ORAL_CAPSULE | Freq: Three times a day (TID) | ORAL | Status: DC
  Administered 2020-06-13 – 2020-06-14 (×2): 200 mg via ORAL
  Filled 2020-06-13 (×3): qty 2

## 2020-06-13 MED ORDER — INSULIN ASPART 100 UNIT/ML ~~LOC~~ SOLN
SUBCUTANEOUS | Status: AC
  Administered 2020-06-13: 3 [IU] via SUBCUTANEOUS
  Filled 2020-06-13: qty 1

## 2020-06-13 MED ORDER — INSULIN GLARGINE 100 UNIT/ML ~~LOC~~ SOLN
30.0000 [IU] | Freq: Two times a day (BID) | SUBCUTANEOUS | Status: DC
  Administered 2020-06-13 – 2020-06-16 (×6): 30 [IU] via SUBCUTANEOUS
  Filled 2020-06-13 (×9): qty 0.3

## 2020-06-13 MED ORDER — INSULIN ASPART 100 UNIT/ML ~~LOC~~ SOLN: 3.0000 [IU] | Freq: Once | SUBCUTANEOUS | Status: AC

## 2020-06-13 MED ORDER — INSULIN ASPART 100 UNIT/ML ~~LOC~~ SOLN
7.0000 [IU] | Freq: Three times a day (TID) | SUBCUTANEOUS | Status: DC
  Administered 2020-06-14 – 2020-06-17 (×12): 7 [IU] via SUBCUTANEOUS
  Filled 2020-06-13 (×12): qty 1

## 2020-06-13 NOTE — ED Notes (Signed)
CBG obtained by this RN. Pt resting in bed with eyes closed. No change in patient condition. Pt awakens easily upon RN arrival to bedside.

## 2020-06-13 NOTE — ED Notes (Signed)
Pt assisted to hospital bed by this RN and Para March, Charity fundraiser. Pt repositioned in bed. Pt tolerated well. VSS at this time.

## 2020-06-13 NOTE — ED Notes (Signed)
Medications administered with apple sauce. Pt tolerated well. Pt requesting several more bites of apple sauce after taking medications. Assisted with this RN. Pt stating that he is cold at this time. Pt repositioned in bed at this time by this RN.

## 2020-06-13 NOTE — Progress Notes (Signed)
Inpatient Diabetes Program Recommendations  AACE/ADA: New Consensus Statement on Inpatient Glycemic Control (2015)  Target Ranges:  Prepandial:   less than 140 mg/dL      Peak postprandial:   less than 180 mg/dL (1-2 hours)      Critically ill patients:  140 - 180 mg/dL   Lab Results  Component Value Date   GLUCAP 154 (H) 06/13/2020   HGBA1C 9.2 (H) 06/12/2020    Review of Glycemic Control  Diabetes history: DM2 Outpatient Diabetes medications: Basaglar 70 units qd + Humalog 15 units ac breakfast + 24 units ac supper + Metformin 500 mg qd  Inpatient Diabetes Program Recommendations:   -Transition to Lantus 35 units bid -Add Novolog 10 units tid meal coverage if eats 50%  Thank you, Darel Hong E. Innocence Schlotzhauer, RN, MSN, CDE  Diabetes Coordinator Inpatient Glycemic Control Team Team Pager [phone] (8am-5pm) 06/13/2020 8:12 AM

## 2020-06-13 NOTE — ED Notes (Signed)
D5 LR and insulin dripped stopped at this time per order from [place], NP and to continue monitoring patient CBG and cover with sliding scale insulin.

## 2020-06-13 NOTE — Progress Notes (Signed)
PROGRESS NOTE    Cory [name]  BHA:[id number] DOB: August 15, 1931 DOA: 06/12/2020 PCP: Rafael Bihari, MD   Brief Narrative: 86 year old male with known history of type 2 diabetes mellitus, obstructive sleep apnea, hypothyroidism, fully vaccinated for Covid who presented to the ER for acute onset generalized weakness associated with dry cough.  Tested positive for COVID-19.  Has been started on Covid specific treatments.  Patient has had markedly elevated blood sugars since admission.  Blood sugars continue to be elevated despite aggressive replacement of subcutaneous insulin.  Presentation not consistent with HHS.  Will hold all subcutaneous insulin and initiate IV insulin via Endo tool protocol.  Covid treatment to be continued.  Admission status upgraded to stepdown unit.  9/15: Patient seen and examined.  HHS/DKA has resolved.  Patient's respiratory rate improved from prior.  Sleeping on my arrival but easily awakened.  Answers all my questions appropriately.  Has been transitioned to subcutaneous insulin regimen.  No fevers over interval.  No oxygen requirement.   Assessment & Plan:   Active Problems:   Pneumonia due to COVID-19 virus   Hyperosmolar hyperglycemic state (HHS) (HCC)   Acute respiratory failure due to COVID-19 [facility])  Multifocal pneumonia secondary to COVID-19 Acute hypoxemic respiratory failure secondary to above, resolved Patient is vaccinated against COVID-19 Suspected breakthrough infection is related to his underlying poorly controlled diabetes Presentation relatively mild up until this point Plan: Continue remdesivir, 06/12/2020- Continue prednisone, 06/12/2020- Supplemental oxygen as needed Albuterol MDI as needed Antitussives Stressed I-S and flutter use Prone positioning as tolerated Supplemental vitamins  Poorly controlled diabetes mellitus with hyperglycemia Hyperglycemic nonketotic state Hyponatremia Patient's blood glucose continued to be markedly  elevated despite aggressive subcutaneous insulin dosing Was initiated on insulin GTT via Endo tool protocol HHS resolved Sodium improved as well.  Likely related to underlying hyperglycemia Transition to subcutaneous insulin regimen Downgraded to MedSurg status Plan: Lantus 30 units twice daily NovoLog 7 units 3 times daily with meals Moderate sliding scale Carb modified diet  Concern for dysphagia SLP evaluation ordered  Atrial fibrillation with controlled ventricular response Appears this is a new finding noted on EKG on 06/11/2020 Patient is remained relatively asymptomatic Rate is controlled He was started on Eliquis reduced dose on admission given his advanced age Plan: Telemetry monitoring Continue Eliquis 2.5 mg p.o. twice daily Consider inpatient cardiology evaluation versus outpatient referral  BPH Flomax  Hypothyroidism Continue Synthroid  Hypertension Continue Accupril  Hyperlipidemia On statin at home.  Will hold while on remdesivir  can resume on discharge   DVT prophylaxis: Eliquis Code Status: Full Family Communication: Discussed with stepdaughter [place] Desanctis on 06/12/2020.  [phone] Disposition Plan: Status is: Inpatient  Remains inpatient appropriate because:Inpatient level of care appropriate due to severity of illness   Dispo: The patient is from: Home              Anticipated d/c is to: SNF              Anticipated d/c date is: 2 days              Patient currently is not medically stable to d/c.  Still with evidence of Covid pneumonia.  Patient is fully vaccinated.  He has been weaned from supplemental oxygen.  Discussed with stepdaughter [place] Desanctis.  Concern regarding patient safety at home.  She would like the patient evaluated for possible rehab facility placement.  Therapy evaluations ordered.  TOC consulted.        Consultants:  None  Procedures:   None  Antimicrobials:  Remdesivir, 06/12/2020-   Subjective: Patient  seen and examined.  Endorses hunger this morning.  Objective: Vitals:   06/13/20 1130 06/13/20 1200 06/13/20 1210 06/13/20 1232  BP: 126/61 108/61 115/60 (!) 134/57  Pulse: 66 65 62 (!) 56  Resp: 20 19 19 19   Temp:      TempSrc:      SpO2: 92% 92% 92% 94%  Weight:      Height:        Intake/Output Summary (Last 24 hours) at 06/13/2020 1328 Last data filed at 06/13/2020 1053 Gross per 24 hour  Intake 1319.92 ml  Output 400 ml  Net 919.92 ml   Filed Weights   06/11/20 1637  Weight: 81.6 kg    Examination:  General exam: Appears calm and comfortable  Respiratory system: Clear to auscultation. Respiratory effort normal. Cardiovascular system: Regular rate, regular rhythm, no murmurs, no pedal edema Gastrointestinal system: Obese, nontender, nondistended, normal bowel sounds Central nervous system: Alert and oriented. No focal neurological deficits. Extremities: Symmetric 5 x 5 power. Skin: No rashes, lesions or ulcers Psychiatry: Judgement and insight appear normal. Mood & affect appropriate.     Data Reviewed: I have personally reviewed following labs and imaging studies  CBC: Recent Labs  Lab 06/11/20 1651 06/12/20 0904 06/13/20 0411  WBC 7.3 6.0 13.8*  NEUTROABS  --  4.7 11.6*  HGB 16.2 16.5 15.0  HCT 45.7 47.1 43.1  MCV 86.2 87.2 86.9  PLT 281 240 279   Basic Metabolic Panel: Recent Labs  Lab 06/11/20 1651 06/12/20 0904 06/12/20 1334 06/13/20 0038 06/13/20 0308 06/13/20 0411 06/13/20 0837  NA 126*   < > 124* 132* 133* 131* 130*  K 4.7   < > 5.7* 4.4 4.6 4.4 4.8  CL 88*   < > 87* 98 96* 96* 95*  CO2 22   < > 22 24 26 25 25   GLUCOSE 391*   < > 592* 127* 145* 158* 182*  BUN 40*   < > 48* 46* 48* 47* 45*  CREATININE 1.69*   < > 1.97* 1.76* 1.66* 1.74* 1.86*  CALCIUM 9.3   < > 8.7* 8.7* 8.9 8.9 8.8*  MG 2.2  --   --   --   --   --   --    < > = values in this interval not displayed.   GFR: Estimated Creatinine Clearance: 27.1 mL/min (A) (by C-G  formula based on SCr of 1.86 mg/dL (H)). Liver Function Tests: Recent Labs  Lab 06/11/20 1651 06/13/20 0411  AST 47* 40  ALT 42 34  ALKPHOS 71 57  BILITOT 1.2 0.7  PROT 7.8 6.7  ALBUMIN 3.3* 3.0*   No results for input(s): LIPASE, AMYLASE in the last 168 hours. No results for input(s): AMMONIA in the last 168 hours. Coagulation Profile: No results for input(s): INR, PROTIME in the last 168 hours. Cardiac Enzymes: No results for input(s): CKTOTAL, CKMB, CKMBINDEX, TROPONINI in the last 168 hours. BNP (last 3 results) No results for input(s): PROBNP in the last 8760 hours. HbA1C: Recent Labs    06/11/20 1651 06/12/20 0904  HGBA1C 9.1* 9.2*   CBG: Recent Labs  Lab 06/13/20 0018 06/13/20 0207 06/13/20 0320 06/13/20 0813 06/13/20 1120  GLUCAP 128* 132* 154* 174* 244*   Lipid Profile: No results for input(s): CHOL, HDL, LDLCALC, TRIG, CHOLHDL, LDLDIRECT in the last 72 hours. Thyroid Function Tests: Recent Labs    06/12/20 0904  TSH 3.037   Anemia Panel: Recent Labs    06/13/20 0411  FERRITIN 376*   Sepsis Labs: No results for input(s): PROCALCITON, LATICACIDVEN in the last 168 hours.  Recent Results (from the past 240 hour(s))  Respiratory Panel by RT PCR (Flu A&B, Covid) - Nasopharyngeal Swab     Status: Abnormal   Collection Time: 06/12/20  1:08 AM   Specimen: Nasopharyngeal Swab  Result Value Ref Range Status   SARS Coronavirus 2 by RT PCR POSITIVE (A) NEGATIVE Final    Comment: RESULT CALLED TO, READ BACK BY AND VERIFIED WITH: BRIANNA CHAPMON @ 0234 06/12/2020 RH (NOTE) SARS-CoV-2 target nucleic acids are DETECTED.  SARS-CoV-2 RNA is generally detectable in upper respiratory specimens  during the acute phase of infection. Positive results are indicative of the presence of the identified virus, but do not rule out bacterial infection or co-infection with other pathogens not detected by the test. Clinical correlation with patient history and other  diagnostic information is necessary to determine patient infection status. The expected result is Negative.  Fact Sheet for Patients:  https://www.moore.com/  Fact Sheet for Healthcare Providers: https://www.young.biz/  This test is not yet approved or cleared by the [place] FDA and  has been authorized for detection and/or diagnosis of SARS-CoV-2 by FDA under an Emergency Use Authorization (EUA).  This EUA will remain in effect (meaning this test can be  used) for the duration of  the COVID-19 declaration under Section 564(b)(1) of the Act, 21 U.S.C. section 360bbb-3(b)(1), unless the authorization is terminated or revoked sooner.      Influenza A by PCR NEGATIVE NEGATIVE Final   Influenza B by PCR NEGATIVE NEGATIVE Final    Comment: (NOTE) The Xpert Xpress SARS-CoV-2/FLU/RSV assay is intended as an aid in  the diagnosis of influenza from Nasopharyngeal swab specimens and  should not be used as a sole basis for treatment. Nasal washings and  aspirates are unacceptable for Xpert Xpress SARS-CoV-2/FLU/RSV  testing.  Fact Sheet for Patients: https://www.moore.com/  Fact Sheet for Healthcare Providers: https://www.young.biz/  This test is not yet approved or cleared by the [place] FDA and  has been authorized for detection and/or diagnosis of SARS-CoV-2 by  FDA under an Emergency Use Authorization (EUA). This EUA will remain  in effect (meaning this test can be used) for the duration of the  Covid-19 declaration under Section 564(b)(1) of the Act, 21  U.S.C. section 360bbb-3(b)(1), unless the authorization is  terminated or revoked. Performed at [facility], [address] [place], [place] [address]          Radiology Studies: CT ABDOMEN PELVIS W CONTRAST  Result Date: 06/12/2020 CLINICAL DATA:  Abdominal pain EXAM: CT ABDOMEN AND PELVIS WITH CONTRAST TECHNIQUE:  Multidetector CT imaging of the abdomen and pelvis was performed using the standard protocol following bolus administration of intravenous contrast. CONTRAST:  26mL OMNIPAQUE IOHEXOL 300 MG/ML  SOLN COMPARISON:  None. FINDINGS: LOWER CHEST: Multifocal bibasilar ground-glass opacity. HEPATOBILIARY: Normal hepatic contours. No intra- or extrahepatic biliary dilatation. Normal gallbladder. PANCREAS: Normal pancreas. No ductal dilatation or peripancreatic fluid collection. SPLEEN: Normal. ADRENALS/URINARY TRACT: The adrenal glands are normal. No hydronephrosis, nephroureterolithiasis or solid renal mass. The urinary bladder is normal for degree of distention STOMACH/BOWEL: There is no hiatal hernia. Normal duodenal course and caliber. No small bowel dilatation or inflammation. No focal colonic abnormality. Normal appendix. Calcified focus in the right upper quadrant. VASCULAR/LYMPHATIC: There is calcific atherosclerosis of the abdominal aorta. No lymphadenopathy. REPRODUCTIVE:  There are calcifications within the normal-sized prostate. Symmetric seminal vesicles. MUSCULOSKELETAL. Multilevel degenerative disc disease and facet arthrosis. No bony spinal canal stenosis. OTHER: None. IMPRESSION: 1. No acute abnormality of the abdomen or pelvis. 2. Multifocal bibasilar ground-glass opacity, concerning for infection or aspiration. Aortic Atherosclerosis (ICD10-I70.0). Electronically Signed   By: Deatra RobinsonKevin  Herman M.D.   On: 06/12/2020 02:26   DG Chest Port 1 View  Result Date: 06/12/2020 CLINICAL DATA:  Generalized weakness with cough EXAM: PORTABLE CHEST 1 VIEW COMPARISON:  11/06/2013 FINDINGS: Streaky atelectasis at the left base. No consolidation or effusion. Stable cardiomediastinal silhouette. No pneumothorax. IMPRESSION: Streaky atelectasis at the left base. Electronically Signed   By: Jasmine PangKim  Fujinaga M.D.   On: 06/12/2020 02:04        Scheduled Meds: . apixaban  2.5 mg Oral BID  . vitamin C  500 mg Oral Daily  .  aspirin EC  81 mg Oral Daily  . cholecalciferol  1,000 Units Oral Daily  . famotidine  20 mg Oral BID  . gabapentin  300 mg Oral TID  . guaiFENesin  600 mg Oral BID  . insulin aspart  0-15 Units Subcutaneous TID AC & HS  . insulin aspart  7 Units Subcutaneous TID WC  . insulin glargine  30 Units Subcutaneous BID  . levothyroxine  88 mcg Oral QAC breakfast  . predniSONE  40 mg Oral Q breakfast  . pyridoxine  100 mg Oral Daily  . quinapril  20 mg Oral Daily  . simvastatin  40 mg Oral q1800  . tamsulosin  0.4 mg Oral Daily  . cyanocobalamin  1,000 mcg Oral Daily  . vitamin E  800 Units Oral Daily  . zinc sulfate  220 mg Oral Daily   Continuous Infusions: . insulin Stopped (06/13/20 0233)  . remdesivir 100 mg in NS 100 mL Stopped (06/13/20 1053)     LOS: 1 day    Time spent: 25 minutes    Tresa MooreSudheer B Jelisa , MD [facility] Pager [phone]  If 7PM-7AM, please contact night-coverage 06/13/2020, 1:28 PM

## 2020-06-13 NOTE — ED Notes (Signed)
Medications administered by this RN. Pt ate whole cup of apple sauce. Pt refusing to eat breakfast. Pt states he wants to "wait a little bit". Pt able to take medications without difficulty in apple sauce.

## 2020-06-13 NOTE — ED Notes (Signed)
Pt ate approx 50% of lunch, pt noted to desat to 88% on RA, placed back on 2L via [place] by this RN.

## 2020-06-13 NOTE — ED Notes (Signed)
Message sent to pharmacy regarding tubing insulin.

## 2020-06-13 NOTE — ED Notes (Signed)
Pt currently sitting up in bed at this time eating lunch. Pt provided with meal tray by this RN. Pt repositioned in bed at this time by this RN to facilitate eating.

## 2020-06-13 NOTE — ED Notes (Signed)
Pt provided with meal tray at this time. Pt sitting up in bed eating dinner at this time.

## 2020-06-13 NOTE — ED Notes (Signed)
Pt continues to sit up in bed eating dinner at this time. Pt visualized in NAD at this time.

## 2020-06-13 NOTE — ED Notes (Signed)
Medications administered as ordered. Pt awakens easily upon RN arrival to bedside. Pt denies further needs at this time.

## 2020-06-14 ENCOUNTER — Inpatient Hospital Stay: Payer: Medicare Other

## 2020-06-14 DIAGNOSIS — E785 Hyperlipidemia, unspecified: Secondary | ICD-10-CM

## 2020-06-14 DIAGNOSIS — I480 Paroxysmal atrial fibrillation: Secondary | ICD-10-CM

## 2020-06-14 DIAGNOSIS — N179 Acute kidney failure, unspecified: Secondary | ICD-10-CM

## 2020-06-14 DIAGNOSIS — I4891 Unspecified atrial fibrillation: Secondary | ICD-10-CM

## 2020-06-14 DIAGNOSIS — N189 Chronic kidney disease, unspecified: Secondary | ICD-10-CM

## 2020-06-14 DIAGNOSIS — R5383 Other fatigue: Secondary | ICD-10-CM

## 2020-06-14 DIAGNOSIS — E1169 Type 2 diabetes mellitus with other specified complication: Secondary | ICD-10-CM

## 2020-06-14 LAB — CBC WITH DIFFERENTIAL/PLATELET
Abs Immature Granulocytes: 0.22 10*3/uL — ABNORMAL HIGH (ref 0.00–0.07)
Basophils Absolute: 0 10*3/uL (ref 0.0–0.1)
Basophils Relative: 0 %
Eosinophils Absolute: 0 10*3/uL (ref 0.0–0.5)
Eosinophils Relative: 0 %
HCT: 45.9 % (ref 39.0–52.0)
Hemoglobin: 15.8 g/dL (ref 13.0–17.0)
Immature Granulocytes: 1 %
Lymphocytes Relative: 8 %
Lymphs Abs: 1.5 10*3/uL (ref 0.7–4.0)
MCH: 30.7 pg (ref 26.0–34.0)
MCHC: 34.4 g/dL (ref 30.0–36.0)
MCV: 89.3 fL (ref 80.0–100.0)
Monocytes Absolute: 1.3 10*3/uL — ABNORMAL HIGH (ref 0.1–1.0)
Monocytes Relative: 7 %
Neutro Abs: 15.5 10*3/uL — ABNORMAL HIGH (ref 1.7–7.7)
Neutrophils Relative %: 84 %
Platelets: 297 10*3/uL (ref 150–400)
RBC: 5.14 MIL/uL (ref 4.22–5.81)
RDW: 13.4 % (ref 11.5–15.5)
WBC: 18.6 10*3/uL — ABNORMAL HIGH (ref 4.0–10.5)
nRBC: 0 % (ref 0.0–0.2)

## 2020-06-14 LAB — COMPREHENSIVE METABOLIC PANEL
ALT: 34 U/L (ref 0–44)
AST: 42 U/L — ABNORMAL HIGH (ref 15–41)
Albumin: 3.1 g/dL — ABNORMAL LOW (ref 3.5–5.0)
Alkaline Phosphatase: 66 U/L (ref 38–126)
Anion gap: 10 (ref 5–15)
BUN: 39 mg/dL — ABNORMAL HIGH (ref 8–23)
CO2: 27 mmol/L (ref 22–32)
Calcium: 9.1 mg/dL (ref 8.9–10.3)
Chloride: 93 mmol/L — ABNORMAL LOW (ref 98–111)
Creatinine, Ser: 1.46 mg/dL — ABNORMAL HIGH (ref 0.61–1.24)
GFR calc Af Amer: 49 mL/min — ABNORMAL LOW (ref >60)
GFR calc non Af Amer: 43 mL/min — ABNORMAL LOW (ref >60)
Glucose, Bld: 225 mg/dL — ABNORMAL HIGH (ref 70–99)
Potassium: 4.3 mmol/L (ref 3.5–5.1)
Sodium: 130 mmol/L — ABNORMAL LOW (ref 135–145)
Total Bilirubin: 0.9 mg/dL (ref 0.3–1.2)
Total Protein: 7.4 g/dL (ref 6.5–8.1)

## 2020-06-14 LAB — GLUCOSE, CAPILLARY
Glucose-Capillary: 316 mg/dL — ABNORMAL HIGH (ref 70–99)
Glucose-Capillary: 232 mg/dL — ABNORMAL HIGH (ref 70–99)
Glucose-Capillary: 267 mg/dL — ABNORMAL HIGH (ref 70–99)
Glucose-Capillary: 244 mg/dL — ABNORMAL HIGH (ref 70–99)

## 2020-06-14 LAB — FIBRIN DERIVATIVES D-DIMER (ARMC ONLY): Fibrin derivatives D-dimer (ARMC): >7500 ng/mL (FEU) — ABNORMAL HIGH (ref 0.00–499.00)

## 2020-06-14 LAB — FERRITIN: Ferritin: 323 ng/mL (ref 24–336)

## 2020-06-14 LAB — C-REACTIVE PROTEIN: CRP: 7 mg/dL — ABNORMAL HIGH (ref <1.0)

## 2020-06-14 IMAGING — US US EXTREM LOW VENOUS
1 series · 13 of 24 positions shown · non-contrast
Comparison: Left lower extremity venous Doppler
ultrasound-[DATE] (negative).

CLINICAL DATA: Elevated D-dimer.  Evaluate for DVT.



[Series 1: us venous img lower bilat (dvt) · portal-venous · 13 of 58 slices shown]
[im 1/58]
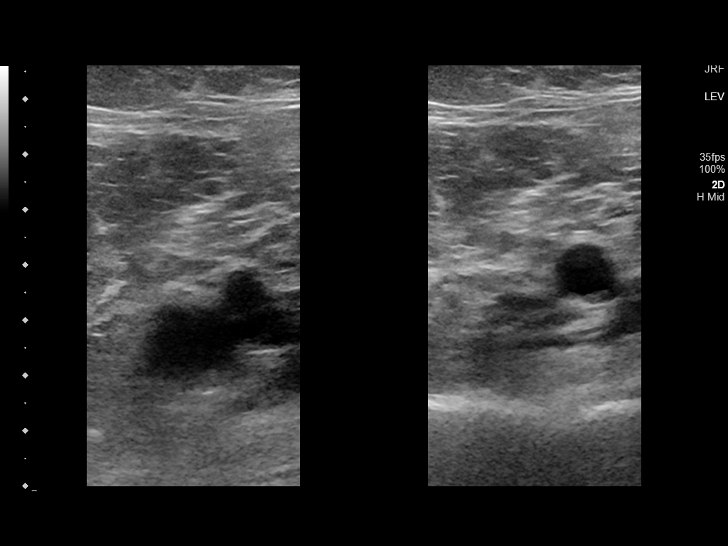
[im 5/58]
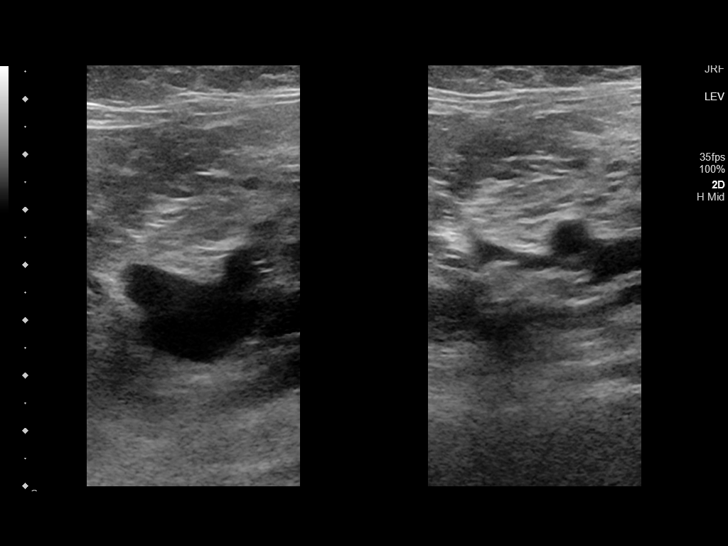
[im 10/58]
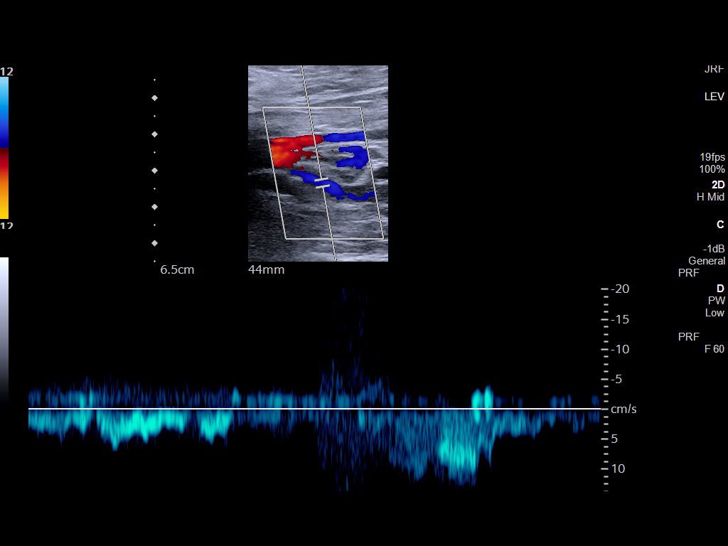
[im 15/58]
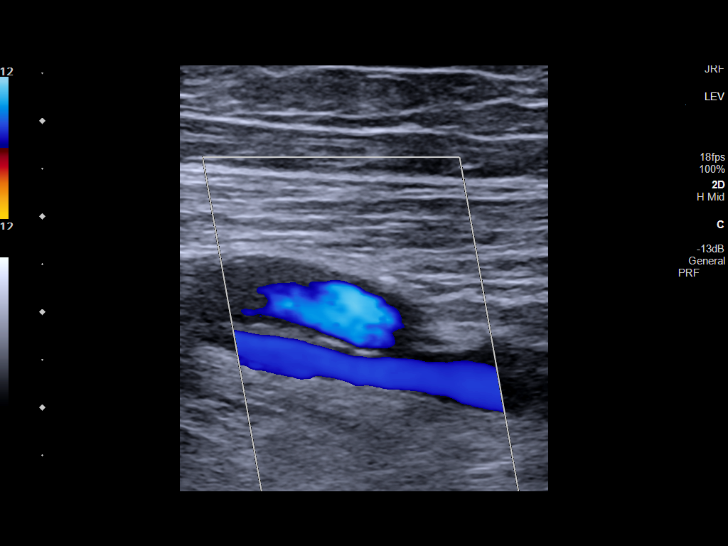
[im 20/58]
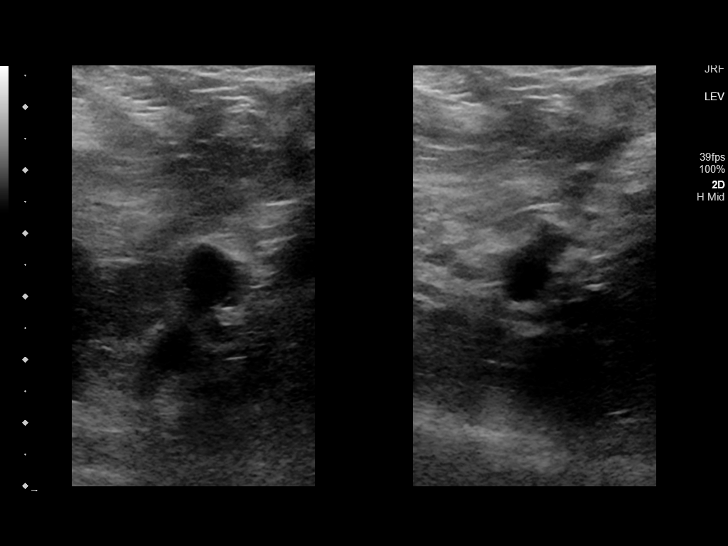
[im 25/58]
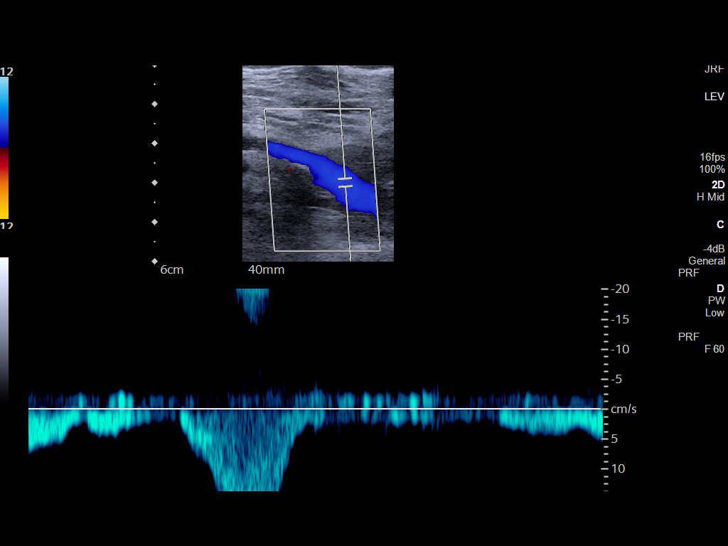
[im 30/58]
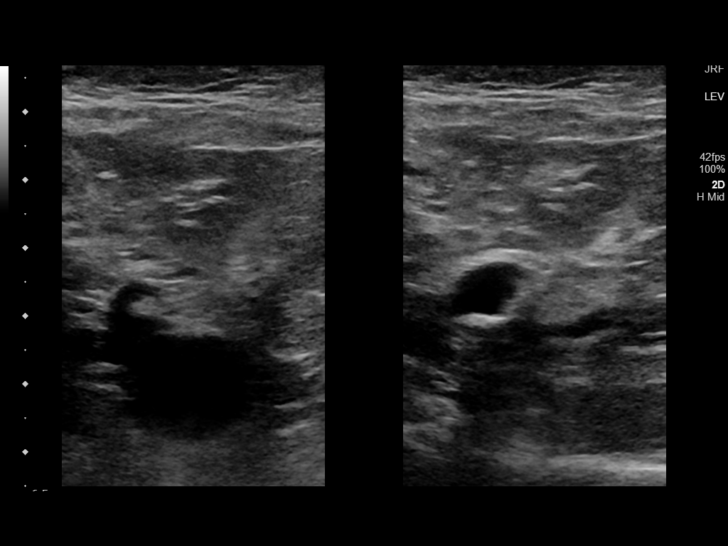
[im 33/58]
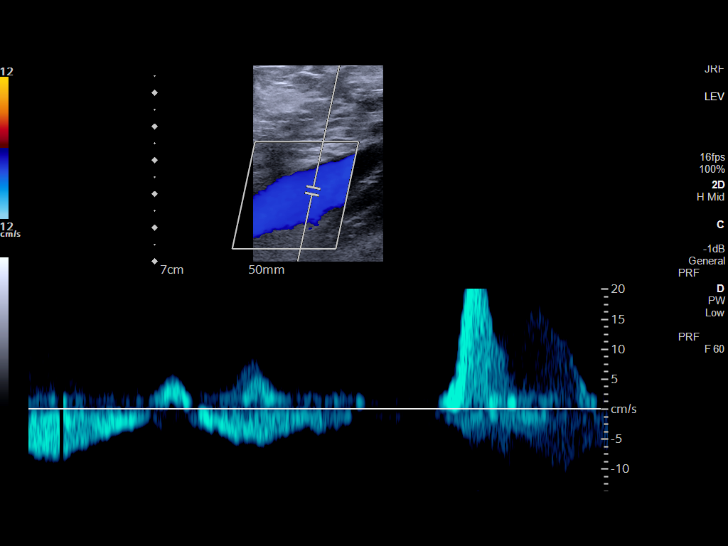
[im 38/58]
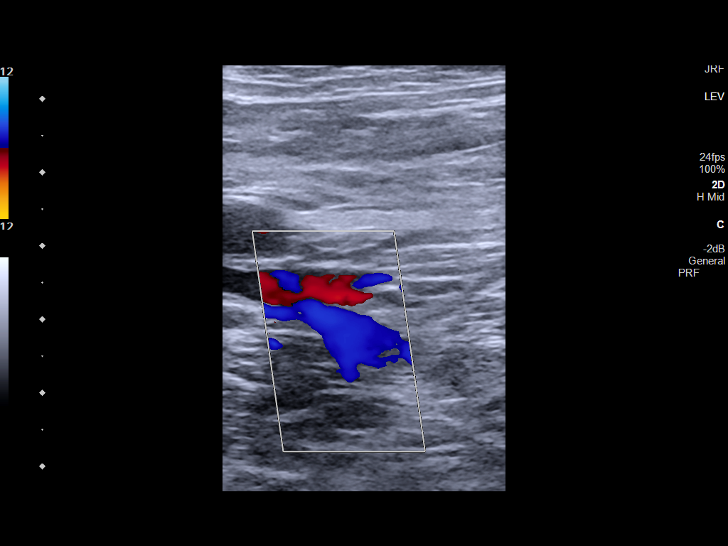
[im 43/58]
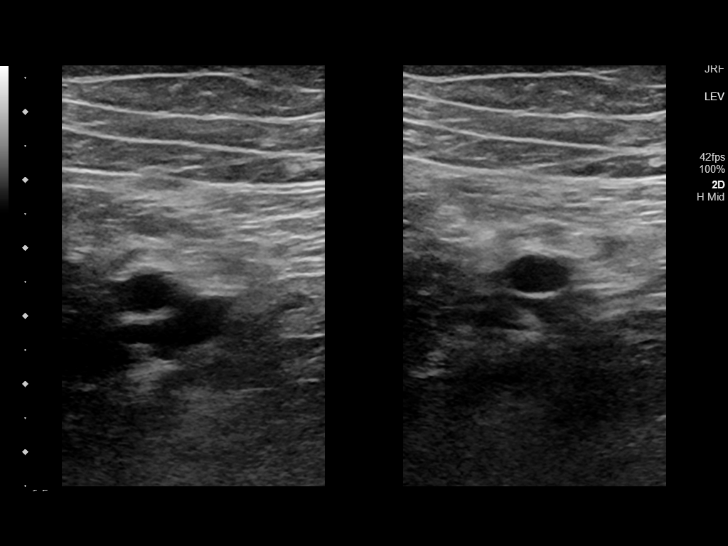
[im 48/58]
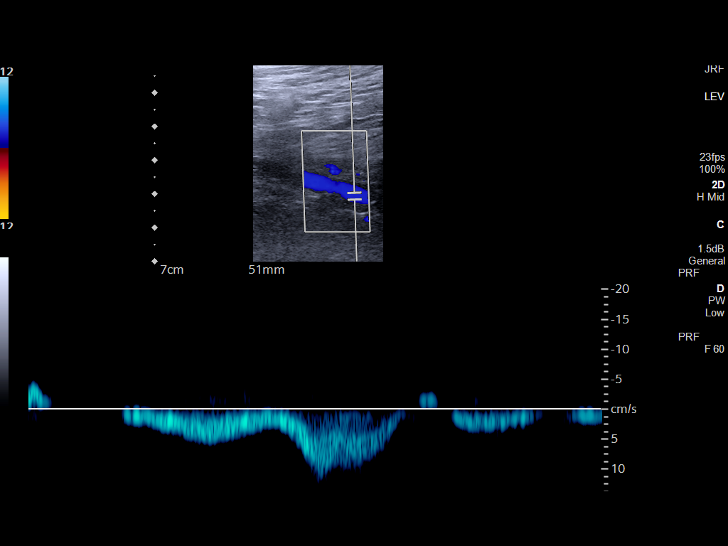
[im 53/58]
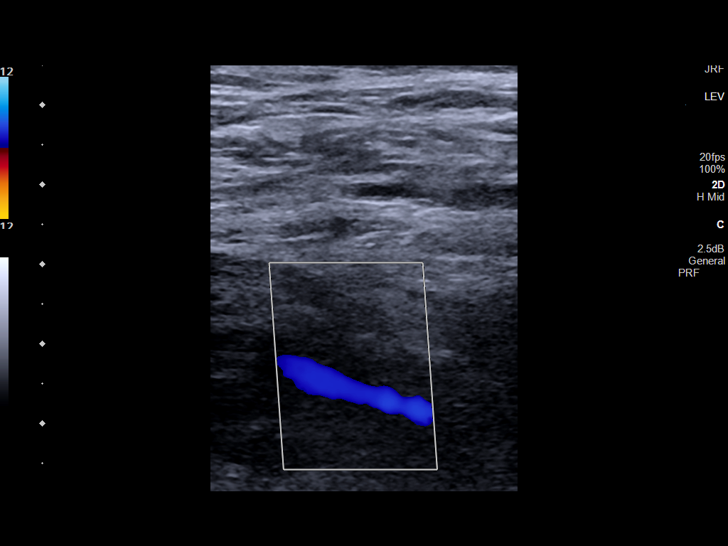
[im 58/58]
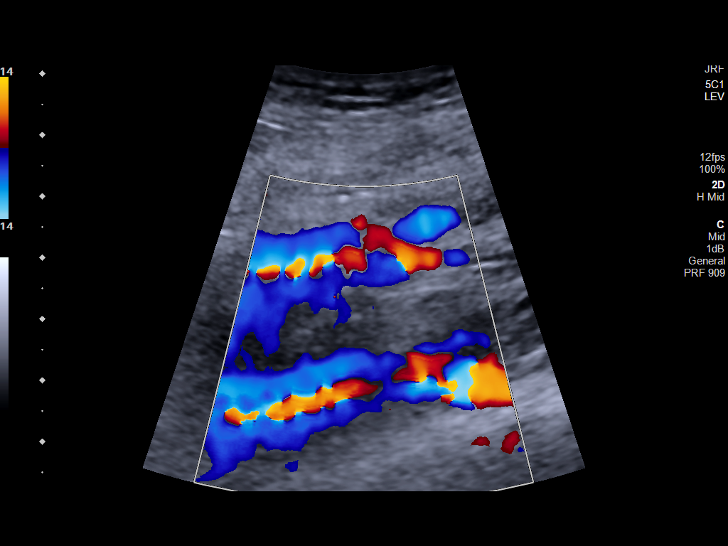

[13 of 24 positions shown; findings below may reference images not displayed]

FINDINGS: RIGHT LOWER EXTREMITY

Common Femoral Vein: No evidence of thrombus. Normal
compressibility, respiratory phasicity and response to augmentation.

Saphenofemoral Junction: No evidence of thrombus. Normal
compressibility and flow on color Doppler imaging.

Profunda Femoral Vein: No evidence of thrombus. Normal
compressibility and flow on color Doppler imaging.

Femoral Vein: No evidence of thrombus. Normal compressibility,
respiratory phasicity and response to augmentation.

Popliteal Vein: No evidence of thrombus. Normal compressibility,
respiratory phasicity and response to augmentation.

Calf Veins: No evidence of thrombus. Normal compressibility and flow
on color Doppler imaging.

Superficial Great Saphenous Vein: No evidence of thrombus. Normal
compressibility.

Venous Reflux:  None.

Other Findings:  None.

LEFT LOWER EXTREMITY

Common Femoral Vein: No evidence of thrombus. Normal
compressibility, respiratory phasicity and response to augmentation.

Saphenofemoral Junction: No evidence of thrombus. Normal
compressibility and flow on color Doppler imaging.

Profunda Femoral Vein: No evidence of thrombus. Normal
compressibility and flow on color Doppler imaging.

Femoral Vein: No evidence of thrombus. Normal compressibility,
respiratory phasicity and response to augmentation.

Popliteal Vein: No evidence of thrombus. Normal compressibility,
respiratory phasicity and response to augmentation.

Calf Veins: No evidence of thrombus. Normal compressibility and flow
on color Doppler imaging.

Superficial Great Saphenous Vein: No evidence of thrombus. Normal
compressibility.

Venous Reflux:  None.

Other Findings:  None.
IMPRESSION: No evidence of DVT within either lower extremity.

## 2020-06-14 NOTE — Progress Notes (Signed)
Patient ID: Cory [name], male   DOB: Mar 31, 1931, 86 y.o.   MRN: [id number] [facility] PROGRESS NOTE  DARELLE KINGS ZCH:[id number] DOB: Jul 06, 1931 DOA: 06/12/2020 PCP: Rafael Bihari, MD  HPI/Subjective: Patient answered some yes or no questions.  Difficult to understand because he talks so softly.  Brought in with weakness and sleeping a lot and found to be COVID-19 positive.  Objective: Vitals:   06/14/20 0737 06/14/20 1152  BP: (!) 133/96 (!) 124/45  Pulse: 80 78  Resp: 20 (!) 24  Temp: 98.1 F (36.7 C) 98.7 F (37.1 C)  SpO2: 100% 97%    Intake/Output Summary (Last 24 hours) at 06/14/2020 1410 Last data filed at 06/14/2020 0400 Gross per 24 hour  Intake 240 ml  Output 1050 ml  Net -810 ml   Filed Weights   06/11/20 1637  Weight: 81.6 kg    ROS: Review of Systems  Respiratory: Positive for cough and shortness of breath.   Cardiovascular: Negative for chest pain.  Gastrointestinal: Positive for nausea. Negative for abdominal pain and vomiting.   Exam: Physical Exam HENT:     Nose: No mucosal edema.     Mouth/Throat:     Pharynx: No oropharyngeal exudate.  Eyes:     General: Lids are normal.     Conjunctiva/sclera: Conjunctivae normal.     Pupils: Pupils are equal, round, and reactive to light.  Cardiovascular:     Rate and Rhythm: Normal rate and regular rhythm.     Heart sounds: Normal heart sounds, S1 normal and S2 normal.  Pulmonary:     Breath sounds: Examination of the right-lower field reveals decreased breath sounds and rhonchi. Examination of the left-lower field reveals decreased breath sounds and rhonchi. Decreased breath sounds and rhonchi present. No wheezing or rales.  Abdominal:     General: Abdomen is flat.     Palpations: Abdomen is soft.     Tenderness: There is no abdominal tenderness.  Musculoskeletal:     Right lower leg: Swelling present.     Left lower leg: Swelling present.  Skin:    General: Skin is warm.     Findings: No  rash.  Neurological:     Mental Status: He is lethargic.       Data Reviewed: Basic Metabolic Panel: Recent Labs  Lab 06/11/20 1651 06/12/20 0904 06/13/20 0038 06/13/20 0308 06/13/20 0411 06/13/20 0837 06/14/20 0603  NA 126*   < > 132* 133* 131* 130* 130*  K 4.7   < > 4.4 4.6 4.4 4.8 4.3  CL 88*   < > 98 96* 96* 95* 93*  CO2 22   < > 24 26 25 25 27   GLUCOSE 391*   < > 127* 145* 158* 182* 225*  BUN 40*   < > 46* 48* 47* 45* 39*  CREATININE 1.69*   < > 1.76* 1.66* 1.74* 1.86* 1.46*  CALCIUM 9.3   < > 8.7* 8.9 8.9 8.8* 9.1  MG 2.2  --   --   --   --   --   --    < > = values in this interval not displayed.   Liver Function Tests: Recent Labs  Lab 06/11/20 1651 06/13/20 0411 06/14/20 0603  AST 47* 40 42*  ALT 42 34 34  ALKPHOS 71 57 66  BILITOT 1.2 0.7 0.9  PROT 7.8 6.7 7.4  ALBUMIN 3.3* 3.0* 3.1*   CBC: Recent Labs  Lab 06/11/20 1651 06/12/20 0904 06/13/20  0411 06/14/20 0603  WBC 7.3 6.0 13.8* 18.6*  NEUTROABS  --  4.7 11.6* 15.5*  HGB 16.2 16.5 15.0 15.8  HCT 45.7 47.1 43.1 45.9  MCV 86.2 87.2 86.9 89.3  PLT 281 240 279 297    CBG: Recent Labs  Lab 06/13/20 1816 06/13/20 2107 06/13/20 2316 06/14/20 0759 06/14/20 1149  GLUCAP 332* 430* 384* 316* 232*    Recent Results (from the past 240 hour(s))  Respiratory Panel by RT PCR (Flu A&B, Covid) - Nasopharyngeal Swab     Status: Abnormal   Collection Time: 06/12/20  1:08 AM   Specimen: Nasopharyngeal Swab  Result Value Ref Range Status   SARS Coronavirus 2 by RT PCR POSITIVE (A) NEGATIVE Final    Comment: RESULT CALLED TO, READ BACK BY AND VERIFIED WITH: BRIANNA CHAPMON @ 0234 06/12/2020 RH (NOTE) SARS-CoV-2 target nucleic acids are DETECTED.  SARS-CoV-2 RNA is generally detectable in upper respiratory specimens  during the acute phase of infection. Positive results are indicative of the presence of the identified virus, but do not rule out bacterial infection or co-infection with other  pathogens not detected by the test. Clinical correlation with patient history and other diagnostic information is necessary to determine patient infection status. The expected result is Negative.  Fact Sheet for Patients:  https://www.moore.com/  Fact Sheet for Healthcare Providers: https://www.young.biz/  This test is not yet approved or cleared by the [place] FDA and  has been authorized for detection and/or diagnosis of SARS-CoV-2 by FDA under an Emergency Use Authorization (EUA).  This EUA will remain in effect (meaning this test can be  used) for the duration of  the COVID-19 declaration under Section 564(b)(1) of the Act, 21 U.S.C. section 360bbb-3(b)(1), unless the authorization is terminated or revoked sooner.      Influenza A by PCR NEGATIVE NEGATIVE Final   Influenza B by PCR NEGATIVE NEGATIVE Final    Comment: (NOTE) The Xpert Xpress SARS-CoV-2/FLU/RSV assay is intended as an aid in  the diagnosis of influenza from Nasopharyngeal swab specimens and  should not be used as a sole basis for treatment. Nasal washings and  aspirates are unacceptable for Xpert Xpress SARS-CoV-2/FLU/RSV  testing.  Fact Sheet for Patients: https://www.moore.com/  Fact Sheet for Healthcare Providers: https://www.young.biz/  This test is not yet approved or cleared by the [place] FDA and  has been authorized for detection and/or diagnosis of SARS-CoV-2 by  FDA under an Emergency Use Authorization (EUA). This EUA will remain  in effect (meaning this test can be used) for the duration of the  Covid-19 declaration under Section 564(b)(1) of the Act, 21  U.S.C. section 360bbb-3(b)(1), unless the authorization is  terminated or revoked. Performed at [facility], [address] [place], [place] [address]       Scheduled Meds: . apixaban  2.5 mg Oral BID  . vitamin C  500 mg Oral Daily  .  aspirin EC  81 mg Oral Daily  . cholecalciferol  1,000 Units Oral Daily  . famotidine  20 mg Oral Daily  . guaiFENesin  600 mg Oral BID  . insulin aspart  0-15 Units Subcutaneous TID AC & HS  . insulin aspart  7 Units Subcutaneous TID WC  . insulin glargine  30 Units Subcutaneous BID  . levothyroxine  88 mcg Oral QAC breakfast  . predniSONE  40 mg Oral Q breakfast  . pyridoxine  100 mg Oral Daily  . quinapril  20 mg Oral Daily  .  tamsulosin  0.4 mg Oral Daily  . cyanocobalamin  1,000 mcg Oral Daily  . vitamin E  800 Units Oral Daily  . zinc sulfate  220 mg Oral Daily   Continuous Infusions: . remdesivir 100 mg in NS 100 mL 100 mg (06/14/20 0943)    Assessment/Plan: 1. Acute hypoxic respiratory failure secondary to COVID-19 pneumonia.  Oxygen supplementation. 2. Multifocal pneumonia secondary to COVID-19 pneumonia.  Continue remdesivir day 3.  Continue steroids day 3.  Albuterol inhaler. 3. Lethargy.  Discontinue gabapentin. 4. New onset atrial fibrillation.  Placed on low-dose Eliquis for anticoagulation.  Currently rate controlled. 5. Acute kidney injury on chronic kidney disease stage IIIb 6. Type 2 diabetes mellitus with hyperglycemia and hyponatremia.  Patient on glargine insulin twice a day and short acting insulin prior to meals. 7. Dysphagia on dysphagia diet as per speech pathology 8. Hypertension on Accupril 9. Hypothyroidism unspecified on levothyroxine 10. Hyperlipidemia hold statin.  Check a CPK 11. BPH on Flomax 12. Patient is a DNR  Code Status:     Code Status Orders  (From admission, onward)         Start     Ordered   06/12/20 0808  Do not attempt resuscitation (DNR)  Continuous       Question Answer Comment  In the event of cardiac or respiratory ARREST Do not call a "code blue"   In the event of cardiac or respiratory ARREST Do not perform Intubation, CPR, defibrillation or ACLS   In the event of cardiac or respiratory ARREST Use medication by any  route, position, wound care, and other measures to relive pain and suffering. May use oxygen, suction and manual treatment of airway obstruction as needed for comfort.      06/12/20 0807        Code Status History    Date Active Date Inactive Code Status Order ID Comments User Context   06/12/2020 0327 06/12/2020 0807 Full Code [id number]  [facility] Vernetta Honey, MD ED   10/18/2016 0015 10/20/2016 1906 Full Code [id number]  Tonye Royalty, DO Inpatient   Advance Care Planning Activity     Family Communication: Spoke with the patient's stepdaughter on the phone who is the main contact. Disposition Plan: Status is: Inpatient  Dispo: The patient is from: Home              Anticipated d/c is to: Rehab              Anticipated d/c date is: We will need to have his mental status better prior to disposition.  Likely will need a few more days here in the hospital              Patient currently being treated for acute hypoxic respiratory failure and COVID-19 pneumonia.  Time spent: 28 minutes  Kristyanna Barcelo [employer]

## 2020-06-14 NOTE — Progress Notes (Signed)
Bedside swallow eval today. Pt somewhat lethargic needing cues to stay awake. Occasional coughing with thin liquids even given in single sips. Pt needed extended time to chew any solid foods and presented with moderate oral residue after. Diet altered to Dysphagia 1 with nectar thick liquids for safety. Report to follow. ST to follow up and alter diet as Pt progresses.

## 2020-06-14 NOTE — Progress Notes (Signed)
Patient is resting in bed with O2 3L [place]. He is lethargic but arouses with stimulation. Assessment of walking with pulse ox, at this time cannot be doable due to patient not having strength to even get up. Asked PT about patient baseline. He stated that two days ago when he worked with patient he was a max 2 person assist.  Talked with daughter on phone and family requests patient go to SNF placement.  He shows no signs of pain or distress.

## 2020-06-14 NOTE — Evaluation (Signed)
Clinical/Bedside Swallow Evaluation Patient Details  Name: Cory [name] MRN: [id number] Date of Birth: [birth year]  Today's Date: 06/14/2020 Time: SLP Start Time (ACUTE ONLY): 1145 SLP Stop Time (ACUTE ONLY): 1245 SLP Time Calculation (min) (ACUTE ONLY): 60 min  Past Medical History:  Past Medical History:  Diagnosis Date  . Diabetes mellitus without complication (HCC)   . OSA (obstructive sleep apnea)   . Thyroid disease    Past Surgical History: History reviewed. No pertinent surgical history. HPI:  Per admitting H&P: "Cory [name]  is a 86 y.o. Caucasian male with a known history of type II diabetes mellitus, obstructive sleep apnea and hypothyroidism, presented to the emergency room with acute onset of generalized weakness with associated dry cough as well as significant diminished p.o. intake over the last 5 to 7 weeks.  The patient was unable to get up onto EMS stretcher at his primary care physician's office without significant assistance.  It was very somnolent during my interview and therefore no history could be obtained from him.  He has been having abdominal pain dysuria.  He denied any fever or chills.  No chest pain or dyspnea or palpitations.  No nausea or vomiting or diarrhea.  No loss of taste or smell.  He has been vaccinated against COVID-19.   Assessment / Plan / Recommendation Clinical Impression  Bedside swallow eval today was limited secondary to Pt lethargy.  Cory [name] is an 86 YO male brought in to the hospital with increased SOB and lethargy with decreased PO intake over the last several days. Today, Cory [name] was able to awaken for a swallow evaluation but needed continuous cues to stay awake. Given a few single sips of water, Pt presented with an occasional immediate cough as well as anterior leakage. Nectar thick liquids were tolerated well with no s/s of aspiration. Given bites of applesauce Pt was able to propel and swallow without difficulty. Moderate oral transit  delay with pieces of a solid graham cracker along with moderate oral residue after the swallow. Given cues, Pt was able to clear the oral residue. Rec Dysphagia 1 diet with nectar thick liquids to be fed only if Pt can remain awake. Meds crushed in applesauce. ST to follow up with toleration of diet and advance as Pt becomes more alert. SLP Visit Diagnosis: Dysphagia, oropharyngeal phase (R13.12)    Aspiration Risk  Moderate aspiration risk    Diet Recommendation Dysphagia 1 (Puree);Nectar-thick liquid   Liquid Administration via: Straw;Cup Medication Administration: Crushed with puree Supervision: Staff to assist with self feeding;Full supervision/cueing for compensatory strategies Compensations: Slow rate;Small sips/bites Postural Changes: Seated upright at 90 degrees;Remain upright for at least 30 minutes after po intake    Other  Recommendations Oral Care Recommendations: Oral care BID   Follow up Recommendations  Will f/u with toleration of diet      Frequency and Duration min 2x/week  1 week       Prognosis Prognosis for Safe Diet Advancement: Fair      Swallow Study   General Date of Onset: 06/12/20 HPI: Per admitting H&P: "Cory [name]  is a 86 y.o. Caucasian male with a known history of type II diabetes mellitus, obstructive sleep apnea and hypothyroidism, presented to the emergency room with acute onset of generalized weakness with associated dry cough as well as significant diminished p.o. intake over the last 5 to 7 weeks.  The patient was unable to get up onto EMS stretcher at his primary care physician's office without  significant assistance.  It was very somnolent during my interview and therefore no history could be obtained from him.  He has been having abdominal pain dysuria.  He denied any fever or chills.  No chest pain or dyspnea or palpitations.  No nausea or vomiting or diarrhea.  No loss of taste or smell.  He has been vaccinated against COVID-19. Type of Study:  Bedside Swallow Evaluation Previous Swallow Assessment: None Diet Prior to this Study: Dysphagia 2 (chopped);Thin liquids Temperature Spikes Noted: No Respiratory Status: Nasal cannula History of Recent Intubation: No Behavior/Cognition: Cooperative;Confused;Lethargic/Drowsy Oral Cavity Assessment: Within Functional Limits Oral Cavity - Dentition: Adequate natural dentition Self-Feeding Abilities: Needs assist;Needs set up;Total assist Patient Positioning: Upright in bed Baseline Vocal Quality: Normal    Oral/Motor/Sensory Function Overall Oral Motor/Sensory Function: Moderate impairment Facial Symmetry: Within Functional Limits Facial Strength: Within Functional Limits Lingual ROM: Reduced right;Reduced left   Ice Chips Ice chips: Within functional limits Presentation: Spoon   Thin Liquid Thin Liquid: Impaired Presentation: Cup;Spoon;Straw Oral Phase Impairments: Poor awareness of bolus Oral Phase Functional Implications: Right anterior spillage;Left anterior spillage Pharyngeal  Phase Impairments: Cough - Immediate    Nectar Thick Nectar Thick Liquid: Within functional limits Presentation: Cup;Spoon   Honey Thick Honey Thick Liquid: Not tested   Puree Puree: Within functional limits Presentation: Spoon   Solid     Solid: Impaired Oral Phase Impairments: Reduced lingual movement/coordination;Poor awareness of bolus;Impaired mastication Oral Phase Functional Implications: Prolonged oral transit;Oral residue      Eather Colas 06/14/2020,1:09 PM

## 2020-06-14 NOTE — Plan of Care (Signed)
  Problem: Education: Goal: Knowledge of General Education information will improve Description: Including pain rating scale, medication(s)/side effects and non-pharmacologic comfort measures Outcome: Not Progressing   Problem: Clinical Measurements: Goal: Diagnostic test results will improve Outcome: Not Progressing   Problem: Nutrition: Goal: Adequate nutrition will be maintained Outcome: Not Progressing   Problem: Skin Integrity: Goal: Risk for impaired skin integrity will decrease Outcome: Not Progressing

## 2020-06-14 NOTE — Progress Notes (Signed)
Checked on patient and he is still arousalable with stimulation. Oxygen still applied to patient. At rest with O2 3L [place] patient is 96%. He has been sleepy. Bed in low position and call bell in reach.

## 2020-06-14 NOTE — Progress Notes (Signed)
Pt BG at HS was 430 mg/dL, covering MD contacted. MD instructed to give 15 units of scheduled aspart and recheck BG in 2 hrs. BG rechecked, slight drop to 384 mg/dL. In no acute distress at this time. Will continue to monitor BG

## 2020-06-14 NOTE — NC FL2 (Signed)
[place] MEDICAID FL2 LEVEL OF CARE SCREENING TOOL     IDENTIFICATION  Patient Name: Cory [name] Birthdate: 02-01-1931 Sex: male Admission Date (Current Location): 06/12/2020  [facility] and [place] Number:  Chiropodist and Address:  [facility], [address] [place], [place] [address]      Provider Number: [id number]  Attending Physician Name and Address:  Alford Highland, MD  Relative Name and Phone Number:  [place] [phone]    Current Level of Care: Hospital Recommended Level of Care: Skilled Nursing Facility Prior Approval Number:    Date Approved/Denied:   PASRR Number: [id number]  Discharge Plan: SNF    Current Diagnoses: Patient Active Problem List   Diagnosis Date Noted   Acute respiratory failure due to COVID-19 [facility]) 06/13/2020   Pneumonia due to COVID-19 virus 06/12/2020   Hyperosmolar hyperglycemic state (HHS) (HCC) 06/12/2020   Intractable pain 10/17/2016    Orientation RESPIRATION BLADDER Height & Weight     Self  O2 ([place] 3L) Incontinent, External catheter Weight: 81.6 kg Height:  5\' 8"  (172.7 cm)  BEHAVIORAL SYMPTOMS/MOOD NEUROLOGICAL BOWEL NUTRITION STATUS      Continent Diet (Dysphagia Diet 2)  AMBULATORY STATUS COMMUNICATION OF NEEDS Skin   Extensive Assist Verbally Skin abrasions, Bruising                       Personal Care Assistance Level of Assistance  Bathing, Feeding, Dressing Bathing Assistance: Maximum assistance Feeding assistance: Limited assistance Dressing Assistance: Maximum assistance     Functional Limitations Info             SPECIAL CARE FACTORS FREQUENCY  PT (By licensed PT), OT (By licensed OT)     PT Frequency: 5 times per week OT Frequency: 5 times per week            Contractures Contractures Info: Not present    Additional Factors Info  Code Status, Allergies Code Status Info: DNR Allergies Info: NKA           Current Medications  (06/14/2020):  This is the current hospital active medication list Current Facility-Administered Medications  Medication Dose Route Frequency Provider Last Rate Last Admin   acetaminophen (TYLENOL) tablet 650 mg  650 mg Oral Q6H PRN Mansy, Jan A, MD       apixaban Feb) tablet 2.5 mg  2.5 mg Oral BID Mansy, Jan A, MD   2.5 mg at 06/13/20 2207   ascorbic acid (VITAMIN C) tablet 500 mg  500 mg Oral Daily Mansy, Jan A, MD   500 mg at 06/13/20 1014   aspirin EC tablet 81 mg  81 mg Oral Daily Mansy, Jan A, MD   81 mg at 06/13/20 1013   chlorpheniramine-HYDROcodone (TUSSIONEX) 10-8 MG/5ML suspension 5 mL  5 mL Oral Q12H PRN Mansy, Jan A, MD       cholecalciferol (VITAMIN D3) tablet 1,000 Units  1,000 Units Oral Daily Mansy, Jan A, MD   1,000 Units at 06/13/20 1013   dextrose 50 % solution 0-50 mL  0-50 mL Intravenous PRN 09-10-1974 B, MD       famotidine (PEPCID) tablet 20 mg  20 mg Oral Daily Sreenath, Sudheer B, MD       gabapentin (NEURONTIN) capsule 200 mg  200 mg Oral TID Lolita Patella B, MD   200 mg at 06/13/20 2207   guaiFENesin (MUCINEX) 12 hr tablet 600 mg  600 mg Oral BID Mansy, Jan  A, MD   600 mg at 06/13/20 2207   guaiFENesin-dextromethorphan (ROBITUSSIN DM) 100-10 MG/5ML syrup 10 mL  10 mL Oral Q4H PRN Mansy, Jan A, MD       insulin aspart (novoLOG) injection 0-15 Units  0-15 Units Subcutaneous TID AC & HS Manuela Schwartz, NP   15 Units at 06/13/20 2204   insulin aspart (novoLOG) injection 7 Units  7 Units Subcutaneous TID WC Sreenath, Sudheer B, MD       insulin glargine (LANTUS) injection 30 Units  30 Units Subcutaneous BID Lolita Patella B, MD   30 Units at 06/13/20 2331   levothyroxine (SYNTHROID) tablet 88 mcg  88 mcg Oral QAC breakfast Mansy, Jan A, MD   88 mcg at 06/14/20 0706   magnesium hydroxide (MILK OF MAGNESIA) suspension 30 mL  30 mL Oral Daily PRN Mansy, Jan A, MD       ondansetron [facility]) tablet 4 mg  4 mg Oral Q6H PRN Mansy, Jan A, MD        Or   ondansetron [facility]) injection 4 mg  4 mg Intravenous Q6H PRN Mansy, Jan A, MD       predniSONE (DELTASONE) tablet 40 mg  40 mg Oral Q breakfast Georgeann Oppenheim, Sudheer B, MD   40 mg at 06/13/20 0840   pyridOXINE (VITAMIN B-6) tablet 100 mg  100 mg Oral Daily Mansy, Jan A, MD   100 mg at 06/13/20 1016   quinapril (ACCUPRIL) tablet 20 mg  20 mg Oral Daily Mansy, Jan A, MD   20 mg at 06/13/20 1015   remdesivir 100 mg in sodium chloride 0.9 % 100 mL IVPB  100 mg Intravenous Daily Mansy, Jan A, MD   Stopped at 06/13/20 1053   tamsulosin (FLOMAX) capsule 0.4 mg  0.4 mg Oral Daily Mansy, Jan A, MD   0.4 mg at 06/13/20 1013   traMADol (ULTRAM) tablet 50 mg  50 mg Oral Q6H PRN Mansy, Jan A, MD       traZODone (DESYREL) tablet 25 mg  25 mg Oral QHS PRN Mansy, Jan A, MD       vitamin B-12 (CYANOCOBALAMIN) tablet 1,000 mcg  1,000 mcg Oral Daily Mansy, Jan A, MD   1,000 mcg at 06/13/20 1016   vitamin E capsule 800 Units  800 Units Oral Daily Mansy, Jan A, MD   800 Units at 06/13/20 1015   zinc sulfate capsule 220 mg  220 mg Oral Daily Mansy, Jan A, MD   220 mg at 06/13/20 1013     Discharge Medications: Please see discharge summary for a list of discharge medications.  Relevant Imaging Results:  Relevant Lab Results:   Additional Information SS# [id number]  Allayne Butcher, RN

## 2020-06-15 DIAGNOSIS — J9601 Acute respiratory failure with hypoxia: Secondary | ICD-10-CM

## 2020-06-15 DIAGNOSIS — R531 Weakness: Secondary | ICD-10-CM

## 2020-06-15 LAB — CBC WITH DIFFERENTIAL/PLATELET
Abs Immature Granulocytes: 0.15 10*3/uL — ABNORMAL HIGH (ref 0.00–0.07)
Basophils Absolute: 0 10*3/uL (ref 0.0–0.1)
Basophils Relative: 0 %
Eosinophils Absolute: 0 10*3/uL (ref 0.0–0.5)
Eosinophils Relative: 0 %
HCT: 46.1 % (ref 39.0–52.0)
Hemoglobin: 15.9 g/dL (ref 13.0–17.0)
Immature Granulocytes: 1 %
Lymphocytes Relative: 9 %
Lymphs Abs: 1.8 10*3/uL (ref 0.7–4.0)
MCH: 30.4 pg (ref 26.0–34.0)
MCHC: 34.5 g/dL (ref 30.0–36.0)
MCV: 88.1 fL (ref 80.0–100.0)
Monocytes Absolute: 1.4 10*3/uL — ABNORMAL HIGH (ref 0.1–1.0)
Monocytes Relative: 7 %
Neutro Abs: 16.1 10*3/uL — ABNORMAL HIGH (ref 1.7–7.7)
Neutrophils Relative %: 83 %
Platelets: 289 10*3/uL (ref 150–400)
RBC: 5.23 MIL/uL (ref 4.22–5.81)
RDW: 13.8 % (ref 11.5–15.5)
WBC: 19.5 10*3/uL — ABNORMAL HIGH (ref 4.0–10.5)
nRBC: 0 % (ref 0.0–0.2)

## 2020-06-15 LAB — FERRITIN: Ferritin: 415 ng/mL — ABNORMAL HIGH (ref 24–336)

## 2020-06-15 LAB — C-REACTIVE PROTEIN: CRP: 18.1 mg/dL — ABNORMAL HIGH (ref <1.0)

## 2020-06-15 LAB — COMPREHENSIVE METABOLIC PANEL
ALT: 31 U/L (ref 0–44)
AST: 34 U/L (ref 15–41)
Albumin: 3 g/dL — ABNORMAL LOW (ref 3.5–5.0)
Alkaline Phosphatase: 70 U/L (ref 38–126)
Anion gap: 10 (ref 5–15)
BUN: 31 mg/dL — ABNORMAL HIGH (ref 8–23)
CO2: 27 mmol/L (ref 22–32)
Calcium: 9.1 mg/dL (ref 8.9–10.3)
Chloride: 93 mmol/L — ABNORMAL LOW (ref 98–111)
Creatinine, Ser: 1.26 mg/dL — ABNORMAL HIGH (ref 0.61–1.24)
GFR calc Af Amer: 59 mL/min — ABNORMAL LOW (ref >60)
GFR calc non Af Amer: 51 mL/min — ABNORMAL LOW (ref >60)
Glucose, Bld: 164 mg/dL — ABNORMAL HIGH (ref 70–99)
Potassium: 4.1 mmol/L (ref 3.5–5.1)
Sodium: 130 mmol/L — ABNORMAL LOW (ref 135–145)
Total Bilirubin: 0.8 mg/dL (ref 0.3–1.2)
Total Protein: 7.4 g/dL (ref 6.5–8.1)

## 2020-06-15 LAB — FIBRIN DERIVATIVES D-DIMER (ARMC ONLY): Fibrin derivatives D-dimer (ARMC): >7500 ng/mL (FEU) — ABNORMAL HIGH (ref 0.00–499.00)

## 2020-06-15 LAB — GLUCOSE, CAPILLARY
Glucose-Capillary: 180 mg/dL — ABNORMAL HIGH (ref 70–99)
Glucose-Capillary: 280 mg/dL — ABNORMAL HIGH (ref 70–99)
Glucose-Capillary: 387 mg/dL — ABNORMAL HIGH (ref 70–99)
Glucose-Capillary: 336 mg/dL — ABNORMAL HIGH (ref 70–99)

## 2020-06-15 LAB — CK: Total CK: 44 U/L — ABNORMAL LOW (ref 49–397)

## 2020-06-15 MED ORDER — BARICITINIB 2 MG PO TABS
2.0000 mg | ORAL_TABLET | Freq: Every day | ORAL | Status: DC
  Administered 2020-06-15 – 2020-06-19 (×5): 2 mg via ORAL
  Filled 2020-06-15 (×5): qty 1

## 2020-06-15 MED ORDER — APIXABAN 5 MG PO TABS
5.0000 mg | ORAL_TABLET | Freq: Two times a day (BID) | ORAL | Status: DC
  Administered 2020-06-15 – 2020-06-16 (×3): 5 mg via ORAL
  Filled 2020-06-15 (×3): qty 1

## 2020-06-15 MED ORDER — MAGNESIUM SULFATE 2 GM/50ML IV SOLN
2.0000 g | Freq: Once | INTRAVENOUS | Status: AC
  Administered 2020-06-15: 11:00:00 2 g via INTRAVENOUS
  Filled 2020-06-15: qty 50

## 2020-06-15 MED ORDER — SODIUM CHLORIDE 0.9 % IV SOLN
INTRAVENOUS | Status: DC | PRN
  Administered 2020-06-15: 10:00:00 500 mL via INTRAVENOUS

## 2020-06-15 NOTE — Progress Notes (Signed)
Patient ID: Cory [name], male   DOB: [birth year], 86 y.o.   MRN: [id number] [facility] PROGRESS NOTE  CABOT CROMARTIE GHW:[id number] DOB: 03-20-1931 DOA: 06/12/2020 PCP: Rafael Bihari, MD  HPI/Subjective:   Objective: Vitals:   06/15/20 1312 06/15/20 1315  BP:  (!) 117/48  Pulse:  74  Resp:  (!) 21  Temp:  98.3 F (36.8 C)  SpO2: 94% 92%    Intake/Output Summary (Last 24 hours) at 06/15/2020 1604 Last data filed at 06/15/2020 0931 Gross per 24 hour  Intake 240 ml  Output --  Net 240 ml   Filed Weights   06/11/20 1637  Weight: 81.6 kg    ROS: Review of Systems  Respiratory: Positive for cough and shortness of breath.   Cardiovascular: Negative for chest pain.  Gastrointestinal: Negative for abdominal pain, nausea and vomiting.   Exam: Physical Exam HENT:     Head: Normocephalic.     Nose: No mucosal edema.     Mouth/Throat:     Pharynx: No oropharyngeal exudate.  Eyes:     General: Lids are normal.     Conjunctiva/sclera: Conjunctivae normal.  Cardiovascular:     Rate and Rhythm: Normal rate and regular rhythm.     Heart sounds: Normal heart sounds, S1 normal and S2 normal.  Pulmonary:     Breath sounds: Examination of the right-middle field reveals decreased breath sounds. Examination of the left-middle field reveals decreased breath sounds. Examination of the right-lower field reveals decreased breath sounds and rhonchi. Examination of the left-lower field reveals decreased breath sounds and rhonchi. Decreased breath sounds and rhonchi present. No rales.  Abdominal:     Palpations: Abdomen is soft.     Tenderness: There is no abdominal tenderness.  Musculoskeletal:     Right lower leg: Swelling present.     Left lower leg: Swelling present.  Skin:    General: Skin is warm.     Findings: No rash.  Neurological:     Mental Status: He is alert.     Comments: Able to straight leg raise on his own       Data Reviewed: Basic Metabolic  Panel: Recent Labs  Lab 06/11/20 1651 06/12/20 0904 06/13/20 0308 06/13/20 0411 06/13/20 0837 06/14/20 0603 06/15/20 0630  NA 126*   < > 133* 131* 130* 130* 130*  K 4.7   < > 4.6 4.4 4.8 4.3 4.1  CL 88*   < > 96* 96* 95* 93* 93*  CO2 22   < > 26 25 25 27 27   GLUCOSE 391*   < > 145* 158* 182* 225* 164*  BUN 40*   < > 48* 47* 45* 39* 31*  CREATININE 1.69*   < > 1.66* 1.74* 1.86* 1.46* 1.26*  CALCIUM 9.3   < > 8.9 8.9 8.8* 9.1 9.1  MG 2.2  --   --   --   --   --   --    < > = values in this interval not displayed.   Liver Function Tests: Recent Labs  Lab 06/11/20 1651 06/13/20 0411 06/14/20 0603 06/15/20 0630  AST 47* 40 42* 34  ALT 42 34 34 31  ALKPHOS 71 57 66 70  BILITOT 1.2 0.7 0.9 0.8  PROT 7.8 6.7 7.4 7.4  ALBUMIN 3.3* 3.0* 3.1* 3.0*   CBC: Recent Labs  Lab 06/11/20 1651 06/12/20 0904 06/13/20 0411 06/14/20 0603 06/15/20 0630  WBC 7.3 6.0 13.8* 18.6* 19.5*  NEUTROABS  --  4.7 11.6* 15.5* 16.1*  HGB 16.2 16.5 15.0 15.8 15.9  HCT 45.7 47.1 43.1 45.9 46.1  MCV 86.2 87.2 86.9 89.3 88.1  PLT 281 240 279 297 289   Cardiac Enzymes: Recent Labs  Lab 06/15/20 0630  CKTOTAL 44*    CBG: Recent Labs  Lab 06/14/20 1149 06/14/20 1653 06/14/20 2012 06/15/20 0908 06/15/20 1302  GLUCAP 232* 267* 244* 180* 280*    Recent Results (from the past 240 hour(s))  Respiratory Panel by RT PCR (Flu A&B, Covid) - Nasopharyngeal Swab     Status: Abnormal   Collection Time: 06/12/20  1:08 AM   Specimen: Nasopharyngeal Swab  Result Value Ref Range Status   SARS Coronavirus 2 by RT PCR POSITIVE (A) NEGATIVE Final    Comment: RESULT CALLED TO, READ BACK BY AND VERIFIED WITH: BRIANNA CHAPMON @ 0234 06/12/2020 RH (NOTE) SARS-CoV-2 target nucleic acids are DETECTED.  SARS-CoV-2 RNA is generally detectable in upper respiratory specimens  during the acute phase of infection. Positive results are indicative of the presence of the identified virus, but do not rule  out bacterial infection or co-infection with other pathogens not detected by the test. Clinical correlation with patient history and other diagnostic information is necessary to determine patient infection status. The expected result is Negative.  Fact Sheet for Patients:  https://www.moore.com/https://www.fda.gov/media/142436/download  Fact Sheet for Healthcare Providers: https://www.young.biz/https://www.fda.gov/media/142435/download  This test is not yet approved or cleared by the [place] FDA and  has been authorized for detection and/or diagnosis of SARS-CoV-2 by FDA under an Emergency Use Authorization (EUA).  This EUA will remain in effect (meaning this test can be  used) for the duration of  the COVID-19 declaration under Section 564(b)(1) of the Act, 21 U.S.C. section 360bbb-3(b)(1), unless the authorization is terminated or revoked sooner.      Influenza A by PCR NEGATIVE NEGATIVE Final   Influenza B by PCR NEGATIVE NEGATIVE Final    Comment: (NOTE) The Xpert Xpress SARS-CoV-2/FLU/RSV assay is intended as an aid in  the diagnosis of influenza from Nasopharyngeal swab specimens and  should not be used as a sole basis for treatment. Nasal washings and  aspirates are unacceptable for Xpert Xpress SARS-CoV-2/FLU/RSV  testing.  Fact Sheet for Patients: https://www.moore.com/https://www.fda.gov/media/142436/download  Fact Sheet for Healthcare Providers: https://www.young.biz/https://www.fda.gov/media/142435/download  This test is not yet approved or cleared by the [place] FDA and  has been authorized for detection and/or diagnosis of SARS-CoV-2 by  FDA under an Emergency Use Authorization (EUA). This EUA will remain  in effect (meaning this test can be used) for the duration of the  Covid-19 declaration under Section 564(b)(1) of the Act, 21  U.S.C. section 360bbb-3(b)(1), unless the authorization is  terminated or revoked. Performed at [facility], [address] [place], [place] [address]      Studies: US Venous Img Lower  Bilateral (DVT)  Result Date: 06/14/2020 CLINICAL DATA:  Elevated D-dimer.  Evaluate for DVT. EXAM: BILATERAL LOWER EXTREMITY VENOUS DOPPLER ULTRASOUND TECHNIQUE: Gray-scale sonography with graded compression, as well as color Doppler and duplex ultrasound were performed to evaluate the lower extremity deep venous systems from the level of the common femoral vein and including the common femoral, femoral, profunda femoral, popliteal and calf veins including the posterior tibial, peroneal and gastrocnemius veins when visible. The superficial great saphenous vein was also interrogated. Spectral Doppler was utilized to evaluate flow at rest and with distal augmentation maneuvers in the common femoral, femoral and popliteal veins. COMPARISON:  Left lower  extremity venous Doppler ultrasound-07/27/2018 (negative). FINDINGS: RIGHT LOWER EXTREMITY Common Femoral Vein: No evidence of thrombus. Normal compressibility, respiratory phasicity and response to augmentation. Saphenofemoral Junction: No evidence of thrombus. Normal compressibility and flow on color Doppler imaging. Profunda Femoral Vein: No evidence of thrombus. Normal compressibility and flow on color Doppler imaging. Femoral Vein: No evidence of thrombus. Normal compressibility, respiratory phasicity and response to augmentation. Popliteal Vein: No evidence of thrombus. Normal compressibility, respiratory phasicity and response to augmentation. Calf Veins: No evidence of thrombus. Normal compressibility and flow on color Doppler imaging. Superficial Great Saphenous Vein: No evidence of thrombus. Normal compressibility. Venous Reflux:  None. Other Findings:  None. LEFT LOWER EXTREMITY Common Femoral Vein: No evidence of thrombus. Normal compressibility, respiratory phasicity and response to augmentation. Saphenofemoral Junction: No evidence of thrombus. Normal compressibility and flow on color Doppler imaging. Profunda Femoral Vein: No evidence of thrombus.  Normal compressibility and flow on color Doppler imaging. Femoral Vein: No evidence of thrombus. Normal compressibility, respiratory phasicity and response to augmentation. Popliteal Vein: No evidence of thrombus. Normal compressibility, respiratory phasicity and response to augmentation. Calf Veins: No evidence of thrombus. Normal compressibility and flow on color Doppler imaging. Superficial Great Saphenous Vein: No evidence of thrombus. Normal compressibility. Venous Reflux:  None. Other Findings:  None. IMPRESSION: No evidence of DVT within either lower extremity. Electronically Signed   By: Simonne Come M.D.   On: 06/14/2020 15:41    Scheduled Meds: . apixaban  5 mg Oral BID  . vitamin C  500 mg Oral Daily  . aspirin EC  81 mg Oral Daily  . baricitinib  4 mg Oral Daily  . cholecalciferol  1,000 Units Oral Daily  . famotidine  20 mg Oral Daily  . guaiFENesin  600 mg Oral BID  . insulin aspart  0-15 Units Subcutaneous TID AC & HS  . insulin aspart  7 Units Subcutaneous TID WC  . insulin glargine  30 Units Subcutaneous BID  . levothyroxine  88 mcg Oral QAC breakfast  . predniSONE  40 mg Oral Q breakfast  . pyridoxine  100 mg Oral Daily  . quinapril  20 mg Oral Daily  . tamsulosin  0.4 mg Oral Daily  . cyanocobalamin  1,000 mcg Oral Daily  . vitamin E  800 Units Oral Daily  . zinc sulfate  220 mg Oral Daily   Continuous Infusions: . sodium chloride 500 mL (06/15/20 1009)  . remdesivir 100 mg in NS 100 mL 100 mg (06/15/20 1011)    Assessment/Plan:  1. Acute hypoxic respiratory failure secondary to COVID-19 pneumonia.  Continue oxygen supplementation and try to taper off if were able to do so.  Currently on 2 L. 2. Multifocal pneumonia secondary to COVID-19.  Continue remdesivir day 4.  Continue steroids day 4.  Albuterol inhaler.  With inflammatory markers jumping up will start baricitinib.  Already on empiric Eliquis.  Ultrasound the lower extremities negative. 3. Lethargy.  This  seems to be improved when I saw him today but looking at the notes with physical therapy and speech therapy looks like he was sleeping this afternoon. 4. Nonsustained V. tach.  I gave IV magnesium. 5. Acute kidney injury chronic kidney disease stage IIIa.  Creatinine improved today to 1.26.  Continue to monitor. 6. Type 2 diabetes mellitus with hyperglycemia and hyperlipidemia.  Holding statin.  Continue glargine insulin twice a day and short acting insulin prior to meals 7. Dysphagia diet as per speech pathology 8. Essential hypertension on Accupril  9. Hypothyroidism unspecified on levothyroxine 10. BPH on Flomax 11. Weakness.  Physical therapy recommending rehab    Code Status:     Code Status Orders  (From admission, onward)         Start     Ordered   06/12/20 0808  Do not attempt resuscitation (DNR)  Continuous       Question Answer Comment  In the event of cardiac or respiratory ARREST Do not call a "code blue"   In the event of cardiac or respiratory ARREST Do not perform Intubation, CPR, defibrillation or ACLS   In the event of cardiac or respiratory ARREST Use medication by any route, position, wound care, and other measures to relive pain and suffering. May use oxygen, suction and manual treatment of airway obstruction as needed for comfort.      06/12/20 0807        Code Status History    Date Active Date Inactive Code Status Order ID Comments User Context   06/12/2020 0327 06/12/2020 0807 Full Code [id number]  [facility] Vernetta Honey, MD ED   10/18/2016 0015 10/20/2016 1906 Full Code [id number]  Tonye Royalty, DO Inpatient   Advance Care Planning Activity     Family Communication: Spoke with stepdaughter on the phone Disposition Plan: Status is: Inpatient  Dispo: The patient is from: Home              Anticipated d/c is to: Rehab              Anticipated d/c date is: Likely will be here another 3 to 4 days              Patient currently being treated for acute hypoxic  respiratory failure and multifocal pneumonia.  Time spent: 27 minutes  [employer]

## 2020-06-15 NOTE — Progress Notes (Signed)
PT Cancellation Note  Patient Details Name: LENNYN BELLANCA MRN: [id number] DOB: 17-Nov-1930   Cancelled Treatment:     PT attempt. Pt was asleep upon arriving. He easily awakes however has difficulty staying awake. He endorses feeling very fatigues/lethargic from medications. Unwilling to participate due to feeling so fatigued. Acute PT will continue to follow and progress as able per POC.    Rushie Chestnut 06/15/2020, 3:15 PM

## 2020-06-15 NOTE — Progress Notes (Signed)
Notified by CCMD pt had 8 beat run of multifocal V-tach. On assessment, pt with no c/o of pain. Was alseep, woke with ease and to full alertness with interaction. Skin cool and dry. EMR notes dx of new onset afib. Pt is now running sinus rhythm. BP 140/56 pulse of 80. 98% on 3L [place]. MD, Doylene Canard, notified via secure chat. Assigned RN to be notified with return from lunch. Will continue to monitor.

## 2020-06-15 NOTE — Progress Notes (Signed)
Continue with Dysphagia 1 diet with nectar thick liquids until consistently more alert. ST to follow and reassess as appropriate.

## 2020-06-16 ENCOUNTER — Inpatient Hospital Stay: Payer: Medicare Other

## 2020-06-16 DIAGNOSIS — E785 Hyperlipidemia, unspecified: Secondary | ICD-10-CM

## 2020-06-16 DIAGNOSIS — I480 Paroxysmal atrial fibrillation: Secondary | ICD-10-CM

## 2020-06-16 DIAGNOSIS — R1310 Dysphagia, unspecified: Secondary | ICD-10-CM

## 2020-06-16 DIAGNOSIS — E1169 Type 2 diabetes mellitus with other specified complication: Secondary | ICD-10-CM

## 2020-06-16 LAB — CBC WITH DIFFERENTIAL/PLATELET
Abs Immature Granulocytes: 0.13 10*3/uL — ABNORMAL HIGH (ref 0.00–0.07)
Basophils Absolute: 0 10*3/uL (ref 0.0–0.1)
Basophils Relative: 0 %
Eosinophils Absolute: 0 10*3/uL (ref 0.0–0.5)
Eosinophils Relative: 0 %
HCT: 45.2 % (ref 39.0–52.0)
Hemoglobin: 15.3 g/dL (ref 13.0–17.0)
Immature Granulocytes: 1 %
Lymphocytes Relative: 15 %
Lymphs Abs: 2.4 10*3/uL (ref 0.7–4.0)
MCH: 30.2 pg (ref 26.0–34.0)
MCHC: 33.8 g/dL (ref 30.0–36.0)
MCV: 89.3 fL (ref 80.0–100.0)
Monocytes Absolute: 1.1 10*3/uL — ABNORMAL HIGH (ref 0.1–1.0)
Monocytes Relative: 7 %
Neutro Abs: 12.6 10*3/uL — ABNORMAL HIGH (ref 1.7–7.7)
Neutrophils Relative %: 77 %
Platelets: 317 10*3/uL (ref 150–400)
RBC: 5.06 MIL/uL (ref 4.22–5.81)
RDW: 13.7 % (ref 11.5–15.5)
WBC: 16.3 10*3/uL — ABNORMAL HIGH (ref 4.0–10.5)
nRBC: 0 % (ref 0.0–0.2)

## 2020-06-16 LAB — PROCALCITONIN: Procalcitonin: 0.2 ng/mL

## 2020-06-16 LAB — COMPREHENSIVE METABOLIC PANEL
ALT: 29 U/L (ref 0–44)
AST: 31 U/L (ref 15–41)
Albumin: 2.8 g/dL — ABNORMAL LOW (ref 3.5–5.0)
Alkaline Phosphatase: 82 U/L (ref 38–126)
Anion gap: 8 (ref 5–15)
BUN: 32 mg/dL — ABNORMAL HIGH (ref 8–23)
CO2: 26 mmol/L (ref 22–32)
Calcium: 9.1 mg/dL (ref 8.9–10.3)
Chloride: 96 mmol/L — ABNORMAL LOW (ref 98–111)
Creatinine, Ser: 1.25 mg/dL — ABNORMAL HIGH (ref 0.61–1.24)
GFR calc Af Amer: 60 mL/min — ABNORMAL LOW (ref >60)
GFR calc non Af Amer: 51 mL/min — ABNORMAL LOW (ref >60)
Glucose, Bld: 238 mg/dL — ABNORMAL HIGH (ref 70–99)
Potassium: 4.3 mmol/L (ref 3.5–5.1)
Sodium: 130 mmol/L — ABNORMAL LOW (ref 135–145)
Total Bilirubin: 0.6 mg/dL (ref 0.3–1.2)
Total Protein: 7.1 g/dL (ref 6.5–8.1)

## 2020-06-16 LAB — GLUCOSE, CAPILLARY
Glucose-Capillary: 288 mg/dL — ABNORMAL HIGH (ref 70–99)
Glucose-Capillary: 449 mg/dL — ABNORMAL HIGH (ref 70–99)
Glucose-Capillary: 383 mg/dL — ABNORMAL HIGH (ref 70–99)
Glucose-Capillary: 285 mg/dL — ABNORMAL HIGH (ref 70–99)

## 2020-06-16 LAB — FERRITIN: Ferritin: 446 ng/mL — ABNORMAL HIGH (ref 24–336)

## 2020-06-16 LAB — FIBRIN DERIVATIVES D-DIMER (ARMC ONLY): Fibrin derivatives D-dimer (ARMC): >7500 ng/mL (FEU) — ABNORMAL HIGH (ref 0.00–499.00)

## 2020-06-16 LAB — C-REACTIVE PROTEIN: CRP: 15.7 mg/dL — ABNORMAL HIGH (ref <1.0)

## 2020-06-16 IMAGING — DX DG CHEST 1V PORT
1 series · 1 of 1 positions shown · non-contrast
Comparison: [DATE], [DATE], [DATE]

CLINICAL DATA: 89-year-old male with hypoxia, COVID infection

EXAM:
PORTABLE CHEST 1 VIEW

[chest ap]
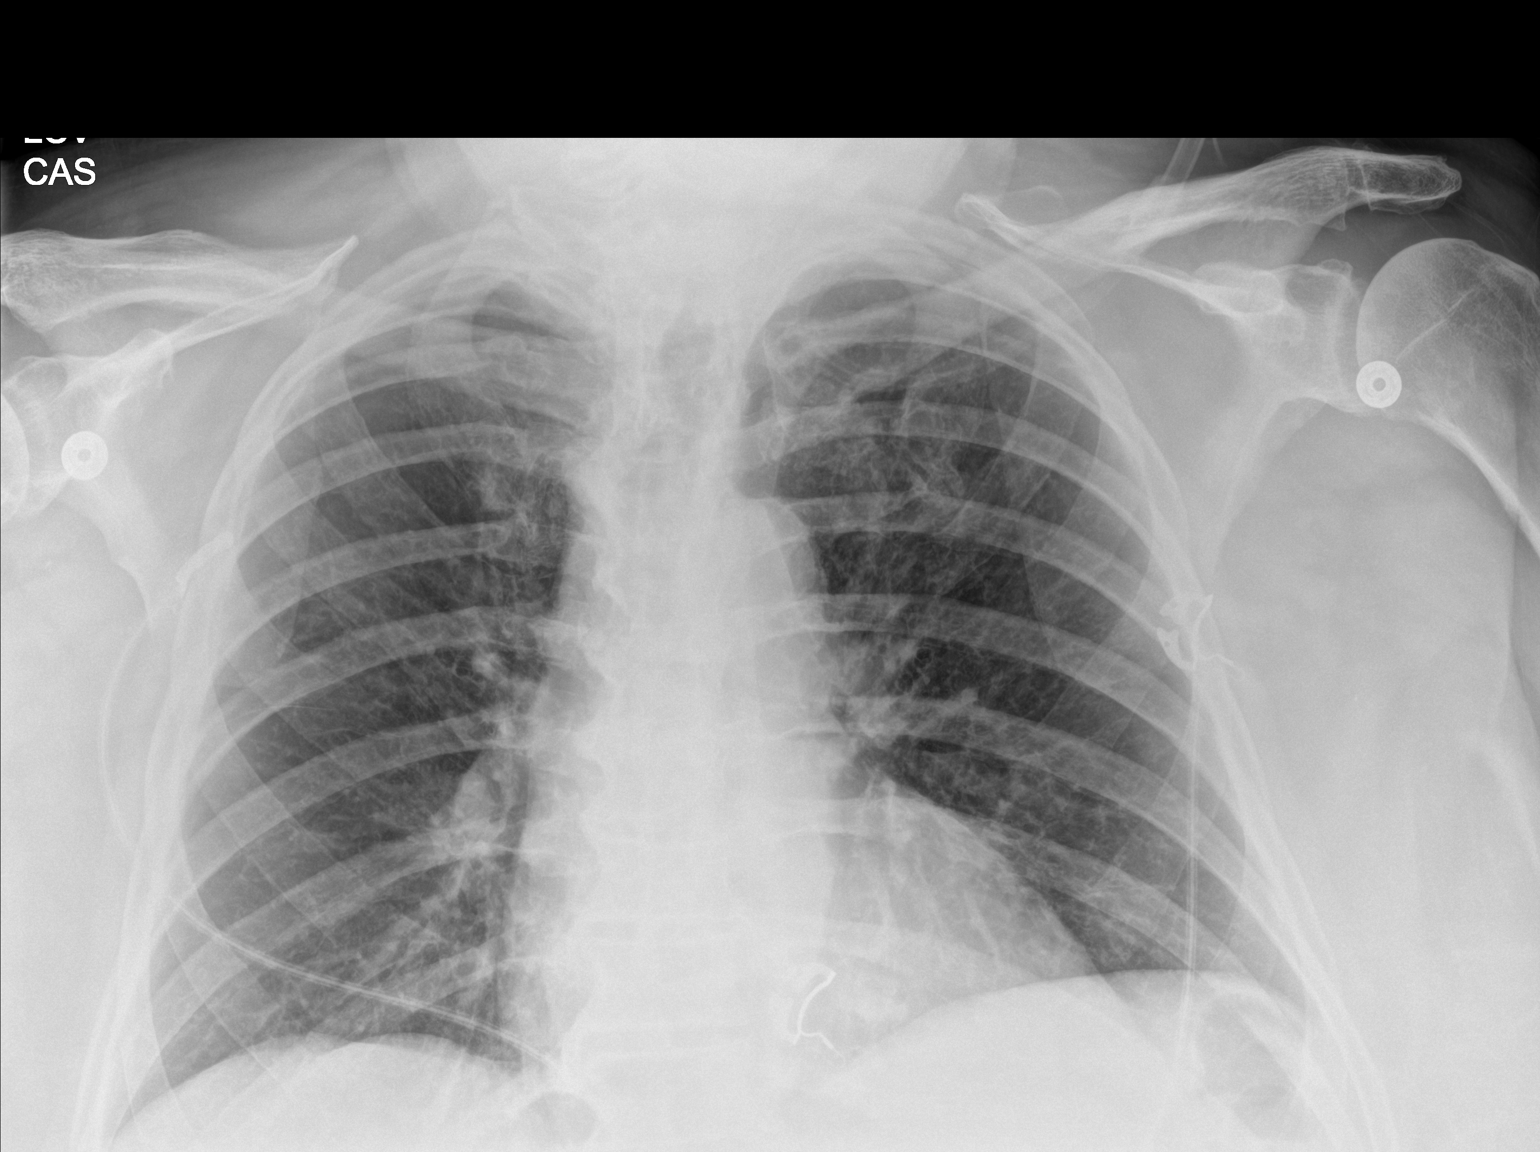

[1 of 1 positions shown; findings below may reference images not displayed]

FINDINGS: Cardiomediastinal silhouette unchanged in size and contour. No
evidence of central vascular congestion. Reticular opacities of the
bilateral lungs, not significantly changed to the comparison plain
film dating to [O6].No confluent airspace disease, pneumothorax, or
pleural effusion.
IMPRESSION: Chronic lung changes without new airspace disease.

## 2020-06-16 MED ORDER — INSULIN ASPART 100 UNIT/ML ~~LOC~~ SOLN
0.0000 [IU] | Freq: Three times a day (TID) | SUBCUTANEOUS | Status: DC
  Administered 2020-06-16: 18:00:00 20 [IU] via SUBCUTANEOUS
  Administered 2020-06-17: 15 [IU] via SUBCUTANEOUS
  Administered 2020-06-17: 11 [IU] via SUBCUTANEOUS
  Administered 2020-06-17: 20 [IU] via SUBCUTANEOUS
  Administered 2020-06-18: 17:00:00 3 [IU] via SUBCUTANEOUS
  Administered 2020-06-19: 12:00:00 7 [IU] via SUBCUTANEOUS
  Filled 2020-06-16 (×6): qty 1

## 2020-06-16 MED ORDER — INSULIN ASPART 100 UNIT/ML ~~LOC~~ SOLN
0.0000 [IU] | Freq: Every day | SUBCUTANEOUS | Status: DC
  Administered 2020-06-16 – 2020-06-17 (×2): 3 [IU] via SUBCUTANEOUS
  Administered 2020-06-18: 2 [IU] via SUBCUTANEOUS
  Filled 2020-06-16 (×3): qty 1

## 2020-06-16 MED ORDER — INSULIN GLARGINE 100 UNIT/ML ~~LOC~~ SOLN
34.0000 [IU] | Freq: Two times a day (BID) | SUBCUTANEOUS | Status: DC
  Administered 2020-06-16 – 2020-06-17 (×3): 34 [IU] via SUBCUTANEOUS
  Filled 2020-06-16 (×5): qty 0.34

## 2020-06-16 MED ORDER — METHYLPREDNISOLONE SODIUM SUCC 40 MG IJ SOLR
20.0000 mg | Freq: Two times a day (BID) | INTRAMUSCULAR | Status: DC
  Administered 2020-06-16 – 2020-06-17 (×2): 20 mg via INTRAVENOUS
  Filled 2020-06-16 (×2): qty 1

## 2020-06-16 NOTE — Plan of Care (Signed)
  Problem: Education: Goal: Knowledge of General Education information will improve Description Including pain rating scale, medication(s)/side effects and non-pharmacologic comfort measures Outcome: Progressing   

## 2020-06-16 NOTE — Progress Notes (Signed)
On rounds patient seemed more alert today than he did on Thursday 06/14/20. He was able to tell me who he was, his birthday, where he was; but not able to tell me situation or what day it was. When breakfast was delivered he was set up and began to feed himself. Did crush pills and put them in applesauce. After settling patient, I notified speech to ask if she would reevaluate patient due to him being more alert and following commands better than Thursday. She evaluated patient and saw that he did much better with liquids and food today. She changed his order from nectar thick to thin liquids. Stated to give pills whole in applesauce. He is calm in bed, call bell in reach.  Oxygen needs have maintained at 96% 2L [place]. Will attempt to titrate needs down to RA today. Patient in no distress or pain.

## 2020-06-16 NOTE — Progress Notes (Signed)
Physical Therapy Treatment Patient Details Name: Cory [name] MRN: [id number] DOB: August 17, 1931 Today's Date: 06/16/2020    History of Present Illness  86 y.o. Caucasian male with a known history of type II diabetes mellitus, obstructive sleep apnea and hypothyroidism, presented to the emergency room with acute onset of generalized weakness with associated dry cough.  Pt tested positive for Covid 19, has been vaccinated.    PT Comments    Pt was supine in bed with HOB elevated ~ 20 degrees upon arriving. He awake but lethargic overall. Disoriented to setting/time/day/year but did correctly state he has virus. He was on 2 L o2 throughout session. Required max assist to achieve EOB short sit. Sat EOB x ~ 15 minutes while performing various strengthening exercises. Vitals were stable throughout however pt requires constant rest breaks and encouragement to continue with full participation. Pt is very deconditioned. Returned to bed and pt tolerated performing bed level exercises.  Pt was too fatigued with EOB sitting to safely attempt standing. Overall tolerated session well but will need extensive PT at DC to address deficits and assist pt to returning to PLOF. Currently recommending DC to SNF when medically stable. Acute PT will continue to follow per POC. At conclusion of session, pt was in bed with bed alarm set and pt talking to spouse on phone.      Follow Up Recommendations  SNF     Equipment Recommendations  Other (comment) (defer to next level of care)    Recommendations for Other Services       Precautions / Restrictions Precautions Precautions: Fall Restrictions Weight Bearing Restrictions: No    Mobility  Bed Mobility Overal bed mobility: Needs Assistance Bed Mobility: Supine to Sit;Sit to Supine     Supine to sit: Max assist Sit to supine: Max assist   General bed mobility comments: Max assist to achieve EOB short sit with max assist to return. He was however able to sit  EOB x ~ 15 minutes while performing ther ex and working on sitting tolerance. Pt's vitals stable throughout on 2 L o2. No desaturation. RR readings low throughout.  Transfers      General transfer comment: did not attempt 2/2 to safety concerns. Pt will require +2 assist for safety to attempt standing. he fatigued with EOB sitting activity so did not progress to transfers.      Balance Overall balance assessment: Needs assistance Sitting-balance support: Feet supported;Bilateral upper extremity supported Sitting balance-Leahy Scale: Fair Sitting balance - Comments: Min assist progressed to supervision. was able to perform LE ther ex EOB without LOB without physical assistance.               Cognition Arousal/Alertness: Lethargic Behavior During Therapy: Flat affect Overall Cognitive Status: No family/caregiver present to determine baseline cognitive functioning        General Comments: Pt is awake however is lethargic. Does agree to PT session. He is disoriented. only able to correctly state why he is in hospital. unaware of location/time/day      Exercises General Exercises - Lower Extremity Ankle Circles/Pumps: AROM;10 reps;Both Quad Sets: AROM;Both;10 reps Gluteal Sets: AROM;10 reps Long Arc Quad: Both;Seated;10 reps (3 x 10 BLEs through available ROM. Increased time to perform) Heel Slides: AROM;10 reps;Both Hip ABduction/ADduction: AAROM;10 reps;Both;Supine Straight Leg Raises: AAROM;Both;10 reps Other Exercises Other Exercises: pt also performed assisted seated marching and core strengthening exercises while sitting EOB. Tolerated all ther ex well but overall pt limited by being deconditioned.  Pertinent Vitals/Pain Pain Assessment: No/denies pain           PT Goals (current goals can now be found in the care plan section) Acute Rehab PT Goals Patient Stated Goal: get stronger Progress towards PT goals: Progressing toward goals    Frequency     Min 2X/week      PT Plan Current plan remains appropriate       AM-PAC PT "6 Clicks" Mobility   Outcome Measure  Help needed turning from your back to your side while in a flat bed without using bedrails?: A Lot Help needed moving from lying on your back to sitting on the side of a flat bed without using bedrails?: Total Help needed moving to and from a bed to a chair (including a wheelchair)?: Total Help needed standing up from a chair using your arms (e.g., wheelchair or bedside chair)?: Total Help needed to walk in hospital room?: Total Help needed climbing 3-5 steps with a railing? : Total 6 Click Score: 7    End of Session Equipment Utilized During Treatment: Gait belt;Oxygen (2 L o2 throughout) Activity Tolerance: Patient tolerated treatment well;No increased pain;Patient limited by fatigue Patient left: in bed;with call bell/phone within reach Nurse Communication: Mobility status PT Visit Diagnosis: Muscle weakness (generalized) (M62.81);Unsteadiness on feet (R26.81);Difficulty in walking, not elsewhere classified (R26.2)     Time: 1533-1600 PT Time Calculation (min) (ACUTE ONLY): 27 min  Charges:  $Therapeutic Exercise: 8-22 mins $Therapeutic Activity: 8-22 mins                     Jetta Lout PTA 06/16/20, 4:18 PM

## 2020-06-16 NOTE — Progress Notes (Signed)
Patient ID: Cory [name], male   DOB: Feb 21, 1931, 86 y.o.   MRN: [id number] [facility] PROGRESS NOTE  Cory [name] MHW:[id number] DOB: [birth year] DOA: 06/12/2020 PCP: Rafael Bihari, MD  HPI/Subjective: Patient seen around 12 noon.  He was not feeling well.  He woke up answered a couple questions told me he was not feeling well and went back to sleep.  Nursing staff did get him down to 2 L on the oxygen.  Patient very weak with physical therapy today  Objective: Vitals:   06/16/20 1201 06/16/20 1624  BP: 124/81 132/66  Pulse: 71 62  Resp: (!) 22 20  Temp: 98.1 F (36.7 C) 98.1 F (36.7 C)  SpO2: 96% 100%    Intake/Output Summary (Last 24 hours) at 06/16/2020 1659 Last data filed at 06/16/2020 1100 Gross per 24 hour  Intake 580 ml  Output 1100 ml  Net -520 ml   Filed Weights   06/11/20 1637  Weight: 81.6 kg    ROS: Review of Systems  Unable to perform ROS: Acuity of condition  Respiratory: Positive for cough and shortness of breath.   Gastrointestinal: Negative for abdominal pain.   Exam: Physical Exam HENT:     Head: Normocephalic.     Nose: No mucosal edema.     Mouth/Throat:     Pharynx: No oropharyngeal exudate.  Eyes:     General: Lids are normal.  Cardiovascular:     Rate and Rhythm: Normal rate and regular rhythm.     Heart sounds: Normal heart sounds and S1 normal.  Pulmonary:     Breath sounds: Examination of the right-lower field reveals decreased breath sounds and rhonchi. Examination of the left-lower field reveals decreased breath sounds and rhonchi. Decreased breath sounds and rhonchi present. No wheezing or rales.  Abdominal:     Palpations: Abdomen is soft.     Tenderness: There is no abdominal tenderness.  Musculoskeletal:     Right lower leg: No swelling.     Left lower leg: No swelling.  Skin:    General: Skin is warm.     Findings: No rash.  Neurological:     Mental Status: He is lethargic.       Data Reviewed: Basic  Metabolic Panel: Recent Labs  Lab 06/11/20 1651 06/12/20 0904 06/13/20 0411 06/13/20 5945 06/14/20 0603 06/15/20 0630 06/16/20 0613  NA 126*   < > 131* 130* 130* 130* 130*  K 4.7   < > 4.4 4.8 4.3 4.1 4.3  CL 88*   < > 96* 95* 93* 93* 96*  CO2 22   < > 25 25 27 27 26   GLUCOSE 391*   < > 158* 182* 225* 164* 238*  BUN 40*   < > 47* 45* 39* 31* 32*  CREATININE 1.69*   < > 1.74* 1.86* 1.46* 1.26* 1.25*  CALCIUM 9.3   < > 8.9 8.8* 9.1 9.1 9.1  MG 2.2  --   --   --   --   --   --    < > = values in this interval not displayed.   Liver Function Tests: Recent Labs  Lab 06/11/20 1651 06/13/20 0411 06/14/20 0603 06/15/20 0630 06/16/20 0613  AST 47* 40 42* 34 31  ALT 42 34 34 31 29  ALKPHOS 71 57 66 70 82  BILITOT 1.2 0.7 0.9 0.8 0.6  PROT 7.8 6.7 7.4 7.4 7.1  ALBUMIN 3.3* 3.0* 3.1* 3.0* 2.8*   CBC: Recent  Labs  Lab 06/12/20 0904 06/13/20 0411 06/14/20 0603 06/15/20 0630 06/16/20 0613  WBC 6.0 13.8* 18.6* 19.5* 16.3*  NEUTROABS 4.7 11.6* 15.5* 16.1* 12.6*  HGB 16.5 15.0 15.8 15.9 15.3  HCT 47.1 43.1 45.9 46.1 45.2  MCV 87.2 86.9 89.3 88.1 89.3  PLT 240 279 297 289 317   Cardiac Enzymes: Recent Labs  Lab 06/15/20 0630  CKTOTAL 44*    CBG: Recent Labs  Lab 06/15/20 1643 06/15/20 2101 06/16/20 0822 06/16/20 1208 06/16/20 1619  GLUCAP 387* 336* 288* 449* 383*    Recent Results (from the past 240 hour(s))  Respiratory Panel by RT PCR (Flu A&B, Covid) - Nasopharyngeal Swab     Status: Abnormal   Collection Time: 06/12/20  1:08 AM   Specimen: Nasopharyngeal Swab  Result Value Ref Range Status   SARS Coronavirus 2 by RT PCR POSITIVE (A) NEGATIVE Final    Comment: RESULT CALLED TO, READ BACK BY AND VERIFIED WITH: BRIANNA CHAPMON @ 0234 06/12/2020 [facility] (NOTE) SARS-CoV-2 target nucleic acids are DETECTED.  SARS-CoV-2 RNA is generally detectable in upper respiratory specimens  during the acute phase of infection. Positive results are indicative of the presence  of the identified virus, but do not rule out bacterial infection or co-infection with other pathogens not detected by the test. Clinical correlation with patient history and other diagnostic information is necessary to determine patient infection status. The expected result is Negative.  Fact Sheet for Patients:  https://www.moore.com/https://www.fda.gov/media/142436/download  Fact Sheet for Healthcare Providers: https://www.young.biz/https://www.fda.gov/media/142435/download  This test is not yet approved or cleared by the [place] FDA and  has been authorized for detection and/or diagnosis of SARS-CoV-2 by FDA under an Emergency Use Authorization (EUA).  This EUA will remain in effect (meaning this test can be  used) for the duration of  the COVID-19 declaration under Section 564(b)(1) of the Act, 21 U.S.C. section 360bbb-3(b)(1), unless the authorization is terminated or revoked sooner.      Influenza A by PCR NEGATIVE NEGATIVE Final   Influenza B by PCR NEGATIVE NEGATIVE Final    Comment: (NOTE) The Xpert Xpress SARS-CoV-2/FLU/RSV assay is intended as an aid in  the diagnosis of influenza from Nasopharyngeal swab specimens and  should not be used as a sole basis for treatment. Nasal washings and  aspirates are unacceptable for Xpert Xpress SARS-CoV-2/FLU/RSV  testing.  Fact Sheet for Patients: https://www.moore.com/https://www.fda.gov/media/142436/download  Fact Sheet for Healthcare Providers: https://www.young.biz/https://www.fda.gov/media/142435/download  This test is not yet approved or cleared by the [place] FDA and  has been authorized for detection and/or diagnosis of SARS-CoV-2 by  FDA under an Emergency Use Authorization (EUA). This EUA will remain  in effect (meaning this test can be used) for the duration of the  Covid-19 declaration under Section 564(b)(1) of the Act, 21  U.S.C. section 360bbb-3(b)(1), unless the authorization is  terminated or revoked. Performed at [facility], [address] [place], [place]  [id number]      Studies: [facility] Chest Chemult[facility] View  Result Date: 06/16/2020 CLINICAL DATA:  86 year old male with hypoxia, COVID infection EXAM: PORTABLE CHEST 1 VIEW COMPARISON:  06/12/2020, 11/06/2013, 10/30/2013 FINDINGS: Cardiomediastinal silhouette unchanged in size and contour. No evidence of central vascular congestion. Reticular opacities of the bilateral lungs, not significantly changed to the comparison plain film dating to 2015.No confluent airspace disease, pneumothorax, or pleural effusion. IMPRESSION: Chronic lung changes without new airspace disease. Electronically Signed   By: Gilmer MorJaime  Wagner D.O.   On: 06/16/2020 10:50  Scheduled Meds: . apixaban  5 mg Oral BID  . vitamin C  500 mg Oral Daily  . aspirin EC  81 mg Oral Daily  . baricitinib  2 mg Oral Daily  . cholecalciferol  1,000 Units Oral Daily  . famotidine  20 mg Oral Daily  . guaiFENesin  600 mg Oral BID  . insulin aspart  0-20 Units Subcutaneous TID WC  . insulin aspart  0-5 Units Subcutaneous QHS  . insulin aspart  7 Units Subcutaneous TID WC  . insulin glargine  34 Units Subcutaneous BID  . levothyroxine  88 mcg Oral QAC breakfast  . methylPREDNISolone (SOLU-MEDROL) injection  20 mg Intravenous Q12H  . pyridoxine  100 mg Oral Daily  . quinapril  20 mg Oral Daily  . tamsulosin  0.4 mg Oral Daily  . cyanocobalamin  1,000 mcg Oral Daily  . vitamin E  800 Units Oral Daily  . zinc sulfate  220 mg Oral Daily   Continuous Infusions: . sodium chloride Stopped (06/15/20 1326)    Assessment/Plan:  1. Acute hypoxic respiratory failure secondary to COVID-19 pneumonia.  Patient on 2 L of oxygen. 2. Multifocal pneumonia secondary to COVID-19.  Remdesivir finished today day 5.  Switch prednisone back to Solu-Medrol with increased inflammatory markers.  Continue albuterol inhaler.  Continue baricitinib.  On empiric Eliquis.  Ultrasound the lower extremities negative.  We will get a CT scan of the chest with persistent fibrin  degradation products greater than 7500. 3. Lethargy.  Gabapentin discontinued.  We will hold tramadol also. 4. Nonsustained V. tach the other day which magnesium was given 5. Paroxysmal atrial fibrillation on Eliquis 6. Type 2 diabetes mellitus with hyperglycemia and hyperlipidemia.  Continue to hold statin.  Continue glargine insulin twice a day and short acting insulin prior to meals.  Increase sliding scale insulin today. 7. Dysphagia diet as per speech therapy 8. Acute kidney injury on chronic kidney disease stage IIIa.  Creatinine improved today to 1.25. 9. Essential hypertension on Accupril 10. Hypothyroidism unspecified.  Continue levothyroxine 11. BPH.  Continue Flomax 12. Weakness.  Physical therapy recommends rehab    Code Status:     Code Status Orders  (From admission, onward)         Start     Ordered   06/12/20 0808  Do not attempt resuscitation (DNR)  Continuous       Question Answer Comment  In the event of cardiac or respiratory ARREST Do not call a "code blue"   In the event of cardiac or respiratory ARREST Do not perform Intubation, CPR, defibrillation or ACLS   In the event of cardiac or respiratory ARREST Use medication by any route, position, wound care, and other measures to relive pain and suffering. May use oxygen, suction and manual treatment of airway obstruction as needed for comfort.      06/12/20 0807        Code Status History    Date Active Date Inactive Code Status Order ID Comments User Context   06/12/2020 0327 06/12/2020 0807 Full Code [id number]  [facility] Vernetta Honey, MD ED   10/18/2016 0015 10/20/2016 1906 Full Code [id number]  Tonye Royalty, DO Inpatient   Advance Care Planning Activity     Family Communication: Spoke with stepdaughter [name] on the phone Disposition Plan: Status is: Inpatient  Dispo: The patient is from: Home              Anticipated d/c is to: Rehab  Anticipated d/c date is: Will likely need a few more days here  in the hospital              Patient currently having periods of lethargy and decreased responsiveness over the last few days.  Being treated for COVID-19 pneumonia.  Inflammatory markers elevated  Time spent: 28 minutes  [employer]

## 2020-06-16 NOTE — Progress Notes (Signed)
  Speech Language Pathology Treatment: Dysphagia  Patient Details Name: Cory [name] MRN: [id number] DOB: [birth year] Today's Date: 06/16/2020 Time: 6606-3016 SLP Time Calculation (min) (ACUTE ONLY): 20 min  Assessment / Plan / Recommendation Clinical Impression  Skilled treatment session targeted pt's ongoing diet management. Pt's nurses contacted SLP to inform that pt was improved over initial evaluation. Therefore SLP provided skilled observation of pt consuming thin liquids via straw and dysphagia 2 diet textures. Pt's swallow initiation appeared swift and his oral phase appeared appropriate with no oral residue present. At this time, pt does appear appropriate for upgrade to dysphagia 2 with liquids via straw, medicine whole in puree. Pt must be AWAKE AN ALERT when consuming POs. ST to follow for further diet management.    HPI HPI: Per admitting H&P: "Cory [name]  is a 86 y.o. Caucasian male with a known history of type II diabetes mellitus, obstructive sleep apnea and hypothyroidism, presented to the emergency room with acute onset of generalized weakness with associated dry cough as well as significant diminished p.o. intake over the last 5 to 7 weeks.  The patient was unable to get up onto EMS stretcher at his primary care physician's office without significant assistance.  It was very somnolent during my interview and therefore no history could be obtained from him.  He has been having abdominal pain dysuria.  He denied any fever or chills.  No chest pain or dyspnea or palpitations.  No nausea or vomiting or diarrhea.  No loss of taste or smell.  He has been vaccinated against COVID-19.      SLP Plan  Continue with current plan of care       Recommendations  Diet recommendations: Dysphagia 2 (fine chop);Thin liquid Liquids provided via: Straw Medication Administration: Whole meds with puree Supervision: Staff to assist with self feeding;Full supervision/cueing for compensatory  strategies Compensations: Slow rate;Small sips/bites Postural Changes and/or Swallow Maneuvers: Seated upright 90 degrees                Oral Care Recommendations: Oral care BID Follow up Recommendations:  (TBD) SLP Visit Diagnosis: Dysphagia, oropharyngeal phase (R13.12) Plan: Continue with current plan of care       GO               Cory [name] B. Dreama Saa M.S., CCC-SLP, [facility] Speech-Language Pathologist Rehabilitation Services Office [phone]  Cory [name] Dreama Saa 06/16/2020, 12:00 PM

## 2020-06-17 ENCOUNTER — Encounter: Payer: Self-pay | Admitting: Family Medicine

## 2020-06-17 ENCOUNTER — Inpatient Hospital Stay: Payer: Medicare Other

## 2020-06-17 DIAGNOSIS — I2699 Other pulmonary embolism without acute cor pulmonale: Secondary | ICD-10-CM

## 2020-06-17 DIAGNOSIS — I2692 Saddle embolus of pulmonary artery without acute cor pulmonale: Secondary | ICD-10-CM

## 2020-06-17 DIAGNOSIS — E875 Hyperkalemia: Secondary | ICD-10-CM

## 2020-06-17 DIAGNOSIS — E871 Hypo-osmolality and hyponatremia: Secondary | ICD-10-CM

## 2020-06-17 LAB — FERRITIN: Ferritin: 553 ng/mL — ABNORMAL HIGH (ref 24–336)

## 2020-06-17 LAB — COMPREHENSIVE METABOLIC PANEL
ALT: 39 U/L (ref 0–44)
AST: 47 U/L — ABNORMAL HIGH (ref 15–41)
Albumin: 2.9 g/dL — ABNORMAL LOW (ref 3.5–5.0)
Alkaline Phosphatase: 86 U/L (ref 38–126)
Anion gap: 8 (ref 5–15)
BUN: 33 mg/dL — ABNORMAL HIGH (ref 8–23)
CO2: 27 mmol/L (ref 22–32)
Calcium: 9.3 mg/dL (ref 8.9–10.3)
Chloride: 93 mmol/L — ABNORMAL LOW (ref 98–111)
Creatinine, Ser: 1.29 mg/dL — ABNORMAL HIGH (ref 0.61–1.24)
GFR calc Af Amer: 57 mL/min — ABNORMAL LOW (ref >60)
GFR calc non Af Amer: 50 mL/min — ABNORMAL LOW (ref >60)
Glucose, Bld: 233 mg/dL — ABNORMAL HIGH (ref 70–99)
Potassium: 5.6 mmol/L — ABNORMAL HIGH (ref 3.5–5.1)
Sodium: 128 mmol/L — ABNORMAL LOW (ref 135–145)
Total Bilirubin: 0.7 mg/dL (ref 0.3–1.2)
Total Protein: 7.4 g/dL (ref 6.5–8.1)

## 2020-06-17 LAB — CBC WITH DIFFERENTIAL/PLATELET
Abs Immature Granulocytes: 0.1 10*3/uL — ABNORMAL HIGH (ref 0.00–0.07)
Basophils Absolute: 0 10*3/uL (ref 0.0–0.1)
Basophils Relative: 0 %
Eosinophils Absolute: 0 10*3/uL (ref 0.0–0.5)
Eosinophils Relative: 0 %
HCT: 44.9 % (ref 39.0–52.0)
Hemoglobin: 15.4 g/dL (ref 13.0–17.0)
Immature Granulocytes: 1 %
Lymphocytes Relative: 14 %
Lymphs Abs: 2.1 10*3/uL (ref 0.7–4.0)
MCH: 30.3 pg (ref 26.0–34.0)
MCHC: 34.3 g/dL (ref 30.0–36.0)
MCV: 88.4 fL (ref 80.0–100.0)
Monocytes Absolute: 0.7 10*3/uL (ref 0.1–1.0)
Monocytes Relative: 5 %
Neutro Abs: 11.5 10*3/uL — ABNORMAL HIGH (ref 1.7–7.7)
Neutrophils Relative %: 80 %
Platelets: 359 10*3/uL (ref 150–400)
RBC: 5.08 MIL/uL (ref 4.22–5.81)
RDW: 13.3 % (ref 11.5–15.5)
WBC: 14.4 10*3/uL — ABNORMAL HIGH (ref 4.0–10.5)
nRBC: 0 % (ref 0.0–0.2)

## 2020-06-17 LAB — PROCALCITONIN: Procalcitonin: <0.1 ng/mL

## 2020-06-17 LAB — GLUCOSE, CAPILLARY
Glucose-Capillary: 259 mg/dL — ABNORMAL HIGH (ref 70–99)
Glucose-Capillary: 412 mg/dL — ABNORMAL HIGH (ref 70–99)
Glucose-Capillary: 339 mg/dL — ABNORMAL HIGH (ref 70–99)
Glucose-Capillary: 283 mg/dL — ABNORMAL HIGH (ref 70–99)

## 2020-06-17 LAB — C-REACTIVE PROTEIN: CRP: 8.9 mg/dL — ABNORMAL HIGH (ref <1.0)

## 2020-06-17 LAB — GLUCOSE, RANDOM: Glucose, Bld: 404 mg/dL — ABNORMAL HIGH (ref 70–99)

## 2020-06-17 LAB — FIBRIN DERIVATIVES D-DIMER (ARMC ONLY): Fibrin derivatives D-dimer (ARMC): 7335.58 ng/mL (FEU) — ABNORMAL HIGH (ref 0.00–499.00)

## 2020-06-17 MED ORDER — IOHEXOL 350 MG/ML SOLN
100.0000 mL | Freq: Once | INTRAVENOUS | Status: AC | PRN
  Administered 2020-06-17: 100 mL via INTRAVENOUS

## 2020-06-17 MED ORDER — SODIUM ZIRCONIUM CYCLOSILICATE 10 G PO PACK
10.0000 g | PACK | Freq: Once | ORAL | Status: AC
  Administered 2020-06-17: 10 g via ORAL
  Filled 2020-06-17: qty 1

## 2020-06-17 MED ORDER — APIXABAN 5 MG PO TABS
10.0000 mg | ORAL_TABLET | Freq: Two times a day (BID) | ORAL | Status: DC
  Administered 2020-06-17 – 2020-06-19 (×5): 10 mg via ORAL
  Filled 2020-06-17 (×5): qty 2

## 2020-06-17 MED ORDER — PREDNISONE 20 MG PO TABS
40.0000 mg | ORAL_TABLET | Freq: Every day | ORAL | Status: DC
  Administered 2020-06-18 – 2020-06-19 (×2): 40 mg via ORAL
  Filled 2020-06-17 (×2): qty 2

## 2020-06-17 MED ORDER — APIXABAN 5 MG PO TABS: 5.0000 mg | ORAL_TABLET | Freq: Two times a day (BID) | ORAL | Status: DC

## 2020-06-17 MED ORDER — APIXABAN 5 MG PO TABS: 10.0000 mg | ORAL_TABLET | Freq: Two times a day (BID) | ORAL | Status: DC

## 2020-06-17 NOTE — Progress Notes (Signed)
Patient ID: Cory [name], male   DOB: 1931-03-30, 86 y.o.   MRN: [id number] [facility] PROGRESS NOTE  Cory [name] OEV:[id number] DOB: 1931/03/24 DOA: 06/12/2020 PCP: Rafael Bihari, MD  HPI/Subjective: Patient more alert today.  Answers questions and follows commands.  Able to straight leg raise.  No complaints of chest pain.  States his breathing is okay.  Objective: Vitals:   06/17/20 0410 06/17/20 0753  BP: (!) 121/46 (!) 144/71  Pulse: 62 (!) 58  Resp:    Temp: 97.7 F (36.5 C) (!) 97.5 F (36.4 C)  SpO2: 99% 94%    Filed Weights   06/11/20 1637  Weight: 81.6 kg    ROS: Review of Systems  Respiratory: Negative for cough and shortness of breath.   Cardiovascular: Negative for chest pain.  Gastrointestinal: Negative for abdominal pain, nausea and vomiting.   Exam: Physical Exam HENT:     Head: Normocephalic.     Nose: No mucosal edema.     Mouth/Throat:     Pharynx: No oropharyngeal exudate.  Eyes:     General: Lids are normal.     Conjunctiva/sclera: Conjunctivae normal.  Cardiovascular:     Rate and Rhythm: Normal rate and regular rhythm.     Heart sounds: Normal heart sounds, S1 normal and S2 normal.  Pulmonary:     Breath sounds: Examination of the right-lower field reveals decreased breath sounds and rhonchi. Examination of the left-lower field reveals decreased breath sounds and rhonchi. Decreased breath sounds and rhonchi present. No wheezing or rales.  Abdominal:     Palpations: Abdomen is soft.     Tenderness: There is no abdominal tenderness.  Musculoskeletal:     Right lower leg: No swelling.     Left lower leg: No swelling.  Skin:    General: Skin is warm.     Findings: No rash.  Neurological:     Mental Status: He is alert.     Comments: Follows commands and able to straight leg raise.       Data Reviewed: Basic Metabolic Panel: Recent Labs  Lab 06/11/20 1651 06/12/20 0904 06/13/20 8299 06/14/20 0603 06/15/20 0630  06/16/20 0613 06/17/20 0635  NA 126*   < > 130* 130* 130* 130* 128*  K 4.7   < > 4.8 4.3 4.1 4.3 5.6*  CL 88*   < > 95* 93* 93* 96* 93*  CO2 22   < > 25 27 27 26 27   GLUCOSE 391*   < > 182* 225* 164* 238* 233*  BUN 40*   < > 45* 39* 31* 32* 33*  CREATININE 1.69*   < > 1.86* 1.46* 1.26* 1.25* 1.29*  CALCIUM 9.3   < > 8.8* 9.1 9.1 9.1 9.3  MG 2.2  --   --   --   --   --   --    < > = values in this interval not displayed.   Liver Function Tests: Recent Labs  Lab 06/13/20 0411 06/14/20 0603 06/15/20 0630 06/16/20 0613 06/17/20 0635  AST 40 42* 34 31 47*  ALT 34 34 31 29 39  ALKPHOS 57 66 70 82 86  BILITOT 0.7 0.9 0.8 0.6 0.7  PROT 6.7 7.4 7.4 7.1 7.4  ALBUMIN 3.0* 3.1* 3.0* 2.8* 2.9*   CBC: Recent Labs  Lab 06/13/20 0411 06/14/20 0603 06/15/20 0630 06/16/20 0613 06/17/20 0635  WBC 13.8* 18.6* 19.5* 16.3* 14.4*  NEUTROABS 11.6* 15.5* 16.1* 12.6* 11.5*  HGB 15.0  15.8 15.9 15.3 15.4  HCT 43.1 45.9 46.1 45.2 44.9  MCV 86.9 89.3 88.1 89.3 88.4  PLT 279 297 289 317 359   Cardiac Enzymes: Recent Labs  Lab 06/15/20 0630  CKTOTAL 44*    CBG: Recent Labs  Lab 06/16/20 1208 06/16/20 1619 06/16/20 2142 06/17/20 0750 06/17/20 1154  GLUCAP 449* 383* 285* 259* 412*    Recent Results (from the past 240 hour(s))  Respiratory Panel by RT PCR (Flu A&B, Covid) - Nasopharyngeal Swab     Status: Abnormal   Collection Time: 06/12/20  1:08 AM   Specimen: Nasopharyngeal Swab  Result Value Ref Range Status   SARS Coronavirus 2 by RT PCR POSITIVE (A) NEGATIVE Final    Comment: RESULT CALLED TO, READ BACK BY AND VERIFIED WITH: BRIANNA CHAPMON @ 0234 06/12/2020 RH (NOTE) SARS-CoV-2 target nucleic acids are DETECTED.  SARS-CoV-2 RNA is generally detectable in upper respiratory specimens  during the acute phase of infection. Positive results are indicative of the presence of the identified virus, but do not rule out bacterial infection or co-infection with other pathogens  not detected by the test. Clinical correlation with patient history and other diagnostic information is necessary to determine patient infection status. The expected result is Negative.  Fact Sheet for Patients:  https://www.moore.com/https://www.fda.gov/media/142436/download  Fact Sheet for Healthcare Providers: https://www.young.biz/https://www.fda.gov/media/142435/download  This test is not yet approved or cleared by the [place] FDA and  has been authorized for detection and/or diagnosis of SARS-CoV-2 by FDA under an Emergency Use Authorization (EUA).  This EUA will remain in effect (meaning this test can be  used) for the duration of  the COVID-19 declaration under Section 564(b)(1) of the Act, 21 U.S.C. section 360bbb-3(b)(1), unless the authorization is terminated or revoked sooner.      Influenza A by PCR NEGATIVE NEGATIVE Final   Influenza B by PCR NEGATIVE NEGATIVE Final    Comment: (NOTE) The Xpert Xpress SARS-CoV-2/FLU/RSV assay is intended as an aid in  the diagnosis of influenza from Nasopharyngeal swab specimens and  should not be used as a sole basis for treatment. Nasal washings and  aspirates are unacceptable for Xpert Xpress SARS-CoV-2/FLU/RSV  testing.  Fact Sheet for Patients: https://www.moore.com/https://www.fda.gov/media/142436/download  Fact Sheet for Healthcare Providers: https://www.young.biz/https://www.fda.gov/media/142435/download  This test is not yet approved or cleared by the [place] FDA and  has been authorized for detection and/or diagnosis of SARS-CoV-2 by  FDA under an Emergency Use Authorization (EUA). This EUA will remain  in effect (meaning this test can be used) for the duration of the  Covid-19 declaration under Section 564(b)(1) of the Act, 21  U.S.C. section 360bbb-3(b)(1), unless the authorization is  terminated or revoked. Performed at [facility], [address] [place], [place] [address]      Studies: CT ANGIO CHEST PE W OR WO CONTRAST  Result Date: 06/17/2020 CLINICAL DATA:   COVID-19 pneumonia, hypoxic respiratory failure, positive D dimer EXAM: CT ANGIOGRAPHY CHEST WITH CONTRAST TECHNIQUE: Multidetector CT imaging of the chest was performed using the standard protocol during bolus administration of intravenous contrast. Multiplanar CT image reconstructions and MIPs were obtained to evaluate the vascular anatomy. CONTRAST:  100mL OMNIPAQUE IOHEXOL 350 MG/ML SOLN COMPARISON:  06/16/2020 FINDINGS: Cardiovascular: This is a technically adequate evaluation of the pulmonary vasculature. Saddle pulmonary embolus is identified, extending primarily into the lower lobe pulmonary vessels. No evidence of right heart strain. The heart is unremarkable without pericardial effusion. Thoracic aorta is grossly normal. Mediastinum/Nodes: No enlarged mediastinal, hilar, or  axillary lymph nodes. Thyroid gland, trachea, and esophagus demonstrate no significant findings. Lungs/Pleura: Minimal bilateral ground-glass airspace disease is identified most pronounced within the upper lobes. Linear areas of consolidation at the right lung base likely reflects postinflammatory scarring. No effusion or pneumothorax. The central airways are patent. Upper Abdomen: No acute abnormality. Musculoskeletal: No acute or destructive bony lesions. Reconstructed images demonstrate no additional findings. Review of the MIP images confirms the above findings. IMPRESSION: 1. Saddle pulmonary embolus, extending primarily into the branches of the lower lobe pulmonary arteries. Moderate clot burden, with no evidence of right heart strain (RV/LV ratio 0.8). 2. Minimal bilateral ground-glass airspace disease consistent with residual pneumonia. 3. Right lower lobe scarring. Critical Value/emergent results were called by telephone at the time of interpretation on 06/17/2020 at 1:14 am to provider Webb Silversmith, who verbally acknowledged these results. Electronically Signed   By: Sharlet Salina M.D.   On: 06/17/2020 01:15   DG Chest  Port 1 View  Result Date: 06/16/2020 CLINICAL DATA:  86 year old male with hypoxia, COVID infection EXAM: PORTABLE CHEST 1 VIEW COMPARISON:  06/12/2020, 11/06/2013, 10/30/2013 FINDINGS: Cardiomediastinal silhouette unchanged in size and contour. No evidence of central vascular congestion. Reticular opacities of the bilateral lungs, not significantly changed to the comparison plain film dating to 2015.No confluent airspace disease, pneumothorax, or pleural effusion. IMPRESSION: Chronic lung changes without new airspace disease. Electronically Signed   By: Gilmer Mor D.O.   On: 06/16/2020 10:50    Scheduled Meds: . apixaban  10 mg Oral BID   Followed by  . [START ON 06/24/2020] apixaban  5 mg Oral BID  . vitamin C  500 mg Oral Daily  . aspirin EC  81 mg Oral Daily  . baricitinib  2 mg Oral Daily  . cholecalciferol  1,000 Units Oral Daily  . famotidine  20 mg Oral Daily  . guaiFENesin  600 mg Oral BID  . insulin aspart  0-20 Units Subcutaneous TID WC  . insulin aspart  0-5 Units Subcutaneous QHS  . insulin aspart  7 Units Subcutaneous TID WC  . insulin glargine  34 Units Subcutaneous BID  . levothyroxine  88 mcg Oral QAC breakfast  . [START ON 06/18/2020] predniSONE  40 mg Oral Q breakfast  . pyridoxine  100 mg Oral Daily  . quinapril  20 mg Oral Daily  . sodium zirconium cyclosilicate  10 g Oral Once  . tamsulosin  0.4 mg Oral Daily  . cyanocobalamin  1,000 mcg Oral Daily  . vitamin E  800 Units Oral Daily  . zinc sulfate  220 mg Oral Daily   Continuous Infusions: . sodium chloride Stopped (06/15/20 1326)    Assessment/Plan:  1. Acute pulmonary embolism without right heart strain.  Moderate clot burden mostly into the lower lobes.  Patient was already empirically on Eliquis 5 mg twice daily.  Will increase to 10 mg twice daily for 7 days and then can go back to 5 mg p.o. twice daily after that for at least 3 months to 6 months.  Risks of bleeding explained to patient and  family. 2. Acute hypoxic respiratory failure secondary to COVID-19 pneumonia.  Patient down to 1 L of oxygen. 3. Multifocal pneumonia secondary to COVID-19.  Patient completed remdesivir.  We will switch Solu-Medrol back to prednisone for tomorrow with elevated sugars.  Continue baricitinib while here. 4. Lethargy at times.  This improved today.  I was able to talk with him. 5. Hyponatremia.  Sodium on the lower side.  Continue with diet without restriction. 6. Paroxysmal atrial fibrillation on Eliquis 7. Dysphagia diet as per speech therapy 8. Acute kidney injury on chronic kidney disease stage IIIa.  Creatinine today 1.29 9. Hyperkalemia today we will give dose of Lokelma and hold Accupril. 10. Essential hypertension holding Accupril with hyperkalemia 11. Hypothyroidism unspecified.  On levothyroxine 12. BPH.  On Flomax 13. Weakness.  Physical therapy recommends rehab    Code Status:     Code Status Orders  (From admission, onward)         Start     Ordered   06/12/20 0808  Do not attempt resuscitation (DNR)  Continuous       Question Answer Comment  In the event of cardiac or respiratory ARREST Do not call a "code blue"   In the event of cardiac or respiratory ARREST Do not perform Intubation, CPR, defibrillation or ACLS   In the event of cardiac or respiratory ARREST Use medication by any route, position, wound care, and other measures to relive pain and suffering. May use oxygen, suction and manual treatment of airway obstruction as needed for comfort.      06/12/20 0807        Code Status History    Date Active Date Inactive Code Status Order ID Comments User Context   06/12/2020 0327 06/12/2020 0807 Full Code [id number]  [facility] Vernetta Honey, MD ED   10/18/2016 0015 10/20/2016 1906 Full Code [id number]  Tonye Royalty, DO Inpatient   Advance Care Planning Activity     Family Communication: Spoke with family on the phone Disposition Plan: Status is: Inpatient  Dispo: The  patient is from: Home              Anticipated d/c is to: Rehab              Anticipated d/c date is: 1 to 2 days              Patient currently being treated for acute pulmonary embolism, Covid 19 pneumonia and acute hypoxic respiratory failure  Time spent: 28 minutes  Chardae [employer]

## 2020-06-17 NOTE — Progress Notes (Signed)
Patient's spouse updated on patient's status. Spouse had no questions.

## 2020-06-18 DIAGNOSIS — I2699 Other pulmonary embolism without acute cor pulmonale: Secondary | ICD-10-CM

## 2020-06-18 LAB — GLUCOSE, CAPILLARY
Glucose-Capillary: 109 mg/dL — ABNORMAL HIGH (ref 70–99)
Glucose-Capillary: 103 mg/dL — ABNORMAL HIGH (ref 70–99)
Glucose-Capillary: 141 mg/dL — ABNORMAL HIGH (ref 70–99)
Glucose-Capillary: 211 mg/dL — ABNORMAL HIGH (ref 70–99)

## 2020-06-18 LAB — BASIC METABOLIC PANEL
Anion gap: 12 (ref 5–15)
BUN: 37 mg/dL — ABNORMAL HIGH (ref 8–23)
CO2: 24 mmol/L (ref 22–32)
Calcium: 9.2 mg/dL (ref 8.9–10.3)
Chloride: 93 mmol/L — ABNORMAL LOW (ref 98–111)
Creatinine, Ser: 1.44 mg/dL — ABNORMAL HIGH (ref 0.61–1.24)
GFR calc Af Amer: 50 mL/min — ABNORMAL LOW (ref >60)
GFR calc non Af Amer: 43 mL/min — ABNORMAL LOW (ref >60)
Glucose, Bld: 68 mg/dL — ABNORMAL LOW (ref 70–99)
Potassium: 4 mmol/L (ref 3.5–5.1)
Sodium: 129 mmol/L — ABNORMAL LOW (ref 135–145)

## 2020-06-18 LAB — FIBRIN DERIVATIVES D-DIMER (ARMC ONLY): Fibrin derivatives D-dimer (ARMC): 6607.3 ng/mL (FEU) — ABNORMAL HIGH (ref 0.00–499.00)

## 2020-06-18 LAB — C-REACTIVE PROTEIN: CRP: 4.7 mg/dL — ABNORMAL HIGH (ref <1.0)

## 2020-06-18 MED ORDER — INSULIN GLARGINE 100 UNIT/ML ~~LOC~~ SOLN
20.0000 [IU] | Freq: Two times a day (BID) | SUBCUTANEOUS | Status: DC
  Administered 2020-06-18: 20 [IU] via SUBCUTANEOUS
  Filled 2020-06-18 (×2): qty 0.2

## 2020-06-18 MED ORDER — INSULIN GLARGINE 100 UNIT/ML ~~LOC~~ SOLN
10.0000 [IU] | Freq: Two times a day (BID) | SUBCUTANEOUS | Status: DC
  Administered 2020-06-18 – 2020-06-19 (×2): 10 [IU] via SUBCUTANEOUS
  Filled 2020-06-18 (×4): qty 0.1

## 2020-06-18 MED ORDER — INSULIN ASPART 100 UNIT/ML ~~LOC~~ SOLN
4.0000 [IU] | Freq: Three times a day (TID) | SUBCUTANEOUS | Status: DC
  Administered 2020-06-18 – 2020-06-19 (×2): 4 [IU] via SUBCUTANEOUS
  Filled 2020-06-18 (×2): qty 1

## 2020-06-18 MED ORDER — SODIUM CHLORIDE 0.9 % IV BOLUS
250.0000 mL | Freq: Once | INTRAVENOUS | Status: AC
  Administered 2020-06-18: 250 mL via INTRAVENOUS

## 2020-06-18 MED ORDER — INSULIN GLARGINE 100 UNIT/ML ~~LOC~~ SOLN
12.0000 [IU] | Freq: Two times a day (BID) | SUBCUTANEOUS | Status: DC
  Filled 2020-06-18 (×2): qty 0.12

## 2020-06-18 NOTE — Progress Notes (Signed)
This RN spoke with patient's significant other, Matthew Folks and updated her on patient's status. Ms. Abran Cantor had no questions about the patient's condition at this time.  Ms. Abran Cantor informed this RN that she and the patient are not legally married and he has no children. This RN asked Ms. Abran Cantor if the patient had a Healthcare POA and Ms. Abran Cantor stated that he does. This RN requested a copy of the patient's Healthcare POA. Ms. Abran Cantor stated that she would bring a copy for the patient's records.

## 2020-06-18 NOTE — Progress Notes (Signed)
Inpatient Diabetes Program Recommendations  AACE/ADA: New Consensus Statement on Inpatient Glycemic Control   Target Ranges:  Prepandial:   less than 140 mg/dL      Peak postprandial:   less than 180 mg/dL (1-2 hours)      Critically ill patients:  140 - 180 mg/dL  Results for KENZO, OZMENT (MRN [id number]) as of 06/18/2020 07:38  Ref. Range 06/18/2020 04:22  Glucose Latest Ref Range: 70 - 99 mg/dL 68 (L)   Results for ROMNEY, COMPEAN (MRN [id number]) as of 06/18/2020 07:38  Ref. Range 06/17/2020 07:50 06/17/2020 11:54 06/17/2020 16:55 06/17/2020 20:43  Glucose-Capillary Latest Ref Range: 70 - 99 mg/dL 314 (H) 970 (H) 263 (H) 283 (H)   Review of Glycemic Control  Current orders for Inpatient glycemic control: Lantus 34 units BID, 0-20 units TID with meals, Novolog 0-5 units QHS, Novolog 7 units TID with meals; Prednisone 40 mg QAM  Inpatient Diabetes Program Recommendations:    Insulin: Noted steroids were decreased to Prednisone starting today and fasting glucose 68 mg/dl this morning on labs. Please consider decreasing Lantus to 31 units BID and increasing meal coverage to Novolog 10 units TID with meals.  Thanks, Orlando Penner, RN, MSN, CDE Diabetes Coordinator Inpatient Diabetes Program [phone] (Team Pager from 8am to 5pm)

## 2020-06-18 NOTE — Progress Notes (Signed)
PT Cancellation Note  Patient Details Name: Cory [name] MRN: [id number] DOB: April 01, 1931   Cancelled Treatment:    Reason Eval/Treat Not Completed: Fatigue/lethargy limiting ability to participate.  Pt able to be awakened, however unable to stay awake at this time.  Pt requested to come back at a later time for therapy.  Will attempt later this PM.   Nolon Bussing, PT, DPT 06/18/20, 11:43 AM

## 2020-06-18 NOTE — Progress Notes (Signed)
Patient ID: Cory [name], male   DOB: [birth year], 86 y.o.   MRN: [id number] [facility] PROGRESS NOTE  Cory [name] LOV:[id number] DOB: [birth year] DOA: 06/12/2020 PCP: Cory [name], Cory B III, MD  HPI/Subjective: Patient up and awake when I saw him today.  Was able to follow commands and straight leg raise.  No complaints of chest pain or shortness of breath.  Was down to 1 L of oxygen.  Objective: Vitals:   06/18/20 0439 06/18/20 0742  BP: (!) 148/107 (!) 110/53  Pulse: 71 65  Resp: 16   Temp: 98.4 F (36.9 C) 97.6 F (36.4 C)  SpO2: 99% 99%    Intake/Output Summary (Last 24 hours) at 06/18/2020 1622 Last data filed at 06/18/2020 0504 Gross per 24 hour  Intake --  Output 1250 ml  Net -1250 ml   Filed Weights   06/11/20 1637  Weight: 81.6 kg    ROS: Review of Systems  Respiratory: Negative for cough and shortness of breath.   Cardiovascular: Negative for chest pain.  Gastrointestinal: Negative for abdominal pain, nausea and vomiting.   Exam: Physical Exam HENT:     Head: Normocephalic.     Mouth/Throat:     Pharynx: No oropharyngeal exudate.  Eyes:     General: Lids are normal.     Conjunctiva/sclera: Conjunctivae normal.  Cardiovascular:     Rate and Rhythm: Normal rate and regular rhythm.     Heart sounds: Normal heart sounds, S1 normal and S2 normal.  Pulmonary:     Breath sounds: Examination of the right-lower field reveals decreased breath sounds and rhonchi. Examination of the left-lower field reveals decreased breath sounds and rhonchi. Decreased breath sounds and rhonchi present. No wheezing or rales.  Abdominal:     Palpations: Abdomen is soft.     Tenderness: There is no abdominal tenderness.  Musculoskeletal:     Right ankle: Swelling present.     Left ankle: Swelling present.  Skin:    General: Skin is warm.     Findings: No lesion.  Neurological:     Mental Status: He is alert.     Comments: Was able to straight leg raise for me today.        Data Reviewed: Basic Metabolic Panel: Recent Labs  Lab 06/11/20 1651 06/12/20 0904 06/14/20 0603 06/14/20 0603 06/15/20 0630 06/16/20 [id number] 06/17/20 0635 06/17/20 1249 06/18/20 0422  NA 126*   < > 130*  --  130* 130* 128*  --  129*  K 4.7   < > 4.3  --  4.1 4.3 5.6*  --  4.0  CL 88*   < > 93*  --  93* 96* 93*  --  93*  CO2 22   < > 27  --  27 26 27   --  24  GLUCOSE 391*   < > 225*   < > 164* 238* 233* 404* 68*  BUN 40*   < > 39*  --  31* 32* 33*  --  37*  CREATININE 1.69*   < > 1.46*  --  1.26* 1.25* 1.29*  --  1.44*  CALCIUM 9.3   < > 9.1  --  9.1 9.1 9.3  --  9.2  MG 2.2  --   --   --   --   --   --   --   --    < > = values in this interval not displayed.   Liver Function Tests: Recent Labs  Lab  06/13/20 0411 06/14/20 0603 06/15/20 0630 06/16/20 0613 06/17/20 0635  AST 40 42* 34 31 47*  ALT 34 34 31 29 39  ALKPHOS 57 66 70 82 86  BILITOT 0.7 0.9 0.8 0.6 0.7  PROT 6.7 7.4 7.4 7.1 7.4  ALBUMIN 3.0* 3.1* 3.0* 2.8* 2.9*   CBC: Recent Labs  Lab 06/13/20 0411 06/14/20 0603 06/15/20 0630 06/16/20 0613 06/17/20 0635  WBC 13.8* 18.6* 19.5* 16.3* 14.4*  NEUTROABS 11.6* 15.5* 16.1* 12.6* 11.5*  HGB 15.0 15.8 15.9 15.3 15.4  HCT 43.1 45.9 46.1 45.2 44.9  MCV 86.9 89.3 88.1 89.3 88.4  PLT 279 297 289 317 359   Cardiac Enzymes: Recent Labs  Lab 06/15/20 0630  CKTOTAL 44*    CBG: Recent Labs  Lab 06/17/20 1655 06/17/20 2043 06/18/20 0743 06/18/20 1147 06/18/20 1607  GLUCAP 339* 283* 109* 103* 141*    Recent Results (from the past 240 hour(s))  Respiratory Panel by RT PCR (Flu A&B, Covid) - Nasopharyngeal Swab     Status: Abnormal   Collection Time: 06/12/20  1:08 AM   Specimen: Nasopharyngeal Swab  Result Value Ref Range Status   SARS Coronavirus 2 by RT PCR POSITIVE (A) NEGATIVE Final    Comment: RESULT CALLED TO, READ BACK BY AND VERIFIED WITH: Cory [name] @ 0234 06/12/2020 RH (NOTE) SARS-CoV-2 target nucleic acids are  DETECTED.  SARS-CoV-2 RNA is generally detectable in upper respiratory specimens  during the acute phase of infection. Positive results are indicative of the presence of the identified virus, but do not rule out bacterial infection or co-infection with other pathogens not detected by the test. Clinical correlation with patient history and other diagnostic information is necessary to determine patient infection status. The expected result is Negative.  Fact Sheet for Patients:  https://www.moore.com/  Fact Sheet for Healthcare Providers: https://www.young.biz/  This test is not yet approved or cleared by the [place] FDA and  has been authorized for detection and/or diagnosis of SARS-CoV-2 by FDA under an Emergency Use Authorization (EUA).  This EUA will remain in effect (meaning this test can be  used) for the duration of  the COVID-19 declaration under Section 564(b)(1) of the Act, 21 U.S.C. section 360bbb-3(b)(1), unless the authorization is terminated or revoked sooner.      Influenza A by PCR NEGATIVE NEGATIVE Final   Influenza B by PCR NEGATIVE NEGATIVE Final    Comment: (NOTE) The Xpert Xpress SARS-CoV-2/FLU/RSV assay is intended as an aid in  the diagnosis of influenza from Nasopharyngeal swab specimens and  should not be used as a sole basis for treatment. Nasal washings and  aspirates are unacceptable for Xpert Xpress SARS-CoV-2/FLU/RSV  testing.  Fact Sheet for Patients: https://www.moore.com/  Fact Sheet for Healthcare Providers: https://www.young.biz/  This test is not yet approved or cleared by the [place] FDA and  has been authorized for detection and/or diagnosis of SARS-CoV-2 by  FDA under an Emergency Use Authorization (EUA). This EUA will remain  in effect (meaning this test can be used) for the duration of the  Covid-19 declaration under Section 564(b)(1) of the Act,  21  U.S.C. section 360bbb-3(b)(1), unless the authorization is  terminated or revoked. Performed at [facility], [address] [place], [place] [address]      Studies: CT ANGIO CHEST PE W OR WO CONTRAST  Result Date: 06/17/2020 CLINICAL DATA:  COVID-19 pneumonia, hypoxic respiratory failure, positive D dimer EXAM: CT ANGIOGRAPHY CHEST WITH CONTRAST TECHNIQUE: Multidetector CT imaging of the  chest was performed using the standard protocol during bolus administration of intravenous contrast. Multiplanar CT image reconstructions and MIPs were obtained to evaluate the vascular anatomy. CONTRAST:  OMNIPAQUE IOHEXOL 350 MG/ML SOLN COMPARISON:  06/16/2020 FINDINGS: Cardiovascular: This is a technically adequate evaluation of the pulmonary vasculature. Saddle pulmonary embolus is identified, extending primarily into the lower lobe pulmonary vessels. No evidence of right heart strain. The heart is unremarkable without pericardial effusion. Thoracic aorta is grossly normal. Mediastinum/Nodes: No enlarged mediastinal, hilar, or axillary lymph nodes. Thyroid gland, trachea, and esophagus demonstrate no significant findings. Lungs/Pleura: Minimal bilateral ground-glass airspace disease is identified most pronounced within the upper lobes. Linear areas of consolidation at the right lung base likely reflects postinflammatory scarring. No effusion or pneumothorax. The central airways are patent. Upper Abdomen: No acute abnormality. Musculoskeletal: No acute or destructive bony lesions. Reconstructed images demonstrate no additional findings. Review of the MIP images confirms the above findings. IMPRESSION: 1. Saddle pulmonary embolus, extending primarily into the branches of the lower lobe pulmonary arteries. Moderate clot burden, with no evidence of right heart strain (RV/LV ratio 0.8). 2. Minimal bilateral ground-glass airspace disease consistent with residual pneumonia. 3. Right lower lobe scarring.  Critical Value/emergent results were called by telephone at the time of interpretation on 06/17/2020 at 1:14 am to provider Webb Silversmith, who verbally acknowledged these results. Electronically Signed   By: Sharlet Salina M.D.   On: 06/17/2020 01:15    Scheduled Meds: . apixaban  10 mg Oral BID   Followed by  . [START ON 06/24/2020] apixaban  5 mg Oral BID  . vitamin C  500 mg Oral Daily  . aspirin EC  81 mg Oral Daily  . baricitinib  2 mg Oral Daily  . cholecalciferol  1,000 Units Oral Daily  . famotidine  20 mg Oral Daily  . guaiFENesin  600 mg Oral BID  . insulin aspart  0-20 Units Subcutaneous TID WC  . insulin aspart  0-5 Units Subcutaneous QHS  . insulin aspart  4 Units Subcutaneous TID WC  . insulin glargine  20 Units Subcutaneous BID  . levothyroxine  88 mcg Oral QAC breakfast  . predniSONE  40 mg Oral Q breakfast  . pyridoxine  100 mg Oral Daily  . tamsulosin  0.4 mg Oral Daily  . cyanocobalamin  1,000 mcg Oral Daily  . vitamin E  800 Units Oral Daily  . zinc sulfate  220 mg Oral Daily   Continuous Infusions: . sodium chloride Stopped (06/15/20 1326)    Assessment/Plan:  1. Acute pulmonary embolism without right heart strain.  Moderate clot burden mostly in the lower lobes but described as saddle.  Continue Eliquis 10 mg twice a day for 7 days and can go back to 5 mg p.o. twice a day after that. 2. Acute hypoxic respiratory failure secondary to COVID-19 pneumonia.  Patient down to 1 L of oxygen. 3. Multifocal pneumonia secondary to COVID-19.  Patient completed remdesivir.  Switch Solu-Medrol back to prednisone.  Continue baricitinib 4. Lethargy at times.  The patient has good periods in the morning where he is able to follow all commands but does sleep in the afternoon. 5. Type 2 diabetes mellitus with hyperlipidemia.  I decreased insulin earlier to 20 units twice a day but will decrease even further this evening 10 units twice a day this evening. 6. Hyponatremia.  Sodium  on the lower side.  Continue diet without restriction 7. Paroxysmal atrial fibrillation.  Patient in normal sinus rhythm  currently.  This was likely secondary to Covid. 8. Dysphagia diet as per speech therapy 9. Essential hypertension.  Held Accupril with hyperkalemia yesterday. 10. Acute kidney injury on chronic kidney disease stage IIIb.  Creatinine up a little bit to 1.44.  Recheck tomorrow. 11. BPH on Flomax 12. Weakness.  Patient will need rehab.   Code Status:     Code Status Orders  (From admission, onward)         Start     Ordered   06/12/20 0808  Do not attempt resuscitation (DNR)  Continuous       Question Answer Comment  In the event of cardiac or respiratory ARREST Do not call a "code blue"   In the event of cardiac or respiratory ARREST Do not perform Intubation, CPR, defibrillation or ACLS   In the event of cardiac or respiratory ARREST Use medication by any route, position, wound care, and other measures to relive pain and suffering. May use oxygen, suction and manual treatment of airway obstruction as needed for comfort.      06/12/20 0807        Code Status History    Date Active Date Inactive Code Status Order ID Comments User Context   06/12/2020 0327 06/12/2020 0807 Full Code [id number]  [facility] Vernetta Honey, MD ED   10/18/2016 0015 10/20/2016 1906 Full Code [id number]  Tonye Royalty, DO Inpatient   Advance Care Planning Activity     Family Communication: Spoke with family on phone and given update Disposition Plan: Status is: Inpatient  Dispo: The patient is from: Home              Anticipated d/c is to: Rehab              Anticipated d/c date is: Whenever transitional care team obtains a rehab bed.  Can be as early as 06/19/2020              Patient currently being treated for acute pulmonary embolism and acute hypoxic respiratory failure multifocal pneumonia secondary to COVID-19.  Time spent:  Faithann [employer]

## 2020-06-18 NOTE — Progress Notes (Signed)
  Speech Language Pathology  - FOLLOW UP  Patient Details Name: Cory [name] MRN: [id number] DOB: 10-27-1930 Today's Date: 06/18/2020     Chart was reviewed and spoke with pt's nurse. She reports that pt is eating 100% of dysphagia 2 trays (improve over puree) and no overt s/s of aspiration or dysphagia witness by nurse. SLP to provide skilled observation on next available date.   Eilleen Davoli B. Dreama Saa M.S., CCC-SLP, [facility] Speech-Language Pathologist Rehabilitation Services Office [phone]

## 2020-06-18 NOTE — Progress Notes (Signed)
PT Cancellation Note  Patient Details Name: TAHER VANNOTE MRN: [id number] DOB: [birth year]   Cancelled Treatment:    Reason Eval/Treat Not Completed: Fatigue/lethargy limiting ability to participate. Pt again unable to stay awake for therapy.  Will reassess at next date for PT treatment.   Nolon Bussing, PT, DPT 06/18/20, 3:24 PM

## 2020-06-19 LAB — C-REACTIVE PROTEIN: CRP: 2.3 mg/dL — ABNORMAL HIGH (ref <1.0)

## 2020-06-19 LAB — CBC
HCT: 43.4 % (ref 39.0–52.0)
Hemoglobin: 15 g/dL (ref 13.0–17.0)
MCH: 30.6 pg (ref 26.0–34.0)
MCHC: 34.6 g/dL (ref 30.0–36.0)
MCV: 88.6 fL (ref 80.0–100.0)
Platelets: 349 10*3/uL (ref 150–400)
RBC: 4.9 MIL/uL (ref 4.22–5.81)
RDW: 13.5 % (ref 11.5–15.5)
WBC: 13.1 10*3/uL — ABNORMAL HIGH (ref 4.0–10.5)
nRBC: 0 % (ref 0.0–0.2)

## 2020-06-19 LAB — GLUCOSE, CAPILLARY
Glucose-Capillary: 103 mg/dL — ABNORMAL HIGH (ref 70–99)
Glucose-Capillary: 210 mg/dL — ABNORMAL HIGH (ref 70–99)

## 2020-06-19 LAB — FIBRIN DERIVATIVES D-DIMER (ARMC ONLY): Fibrin derivatives D-dimer (ARMC): 5464.09 ng/mL (FEU) — ABNORMAL HIGH (ref 0.00–499.00)

## 2020-06-19 MED ORDER — INSULIN ASPART 100 UNIT/ML ~~LOC~~ SOLN: 4.0000 [IU] | Freq: Three times a day (TID) | SUBCUTANEOUS | 0 refills | Status: DC

## 2020-06-19 MED ORDER — ZINC SULFATE 220 (50 ZN) MG PO CAPS: 220.0000 mg | ORAL_CAPSULE | Freq: Every day | ORAL | 0 refills | Status: DC

## 2020-06-19 MED ORDER — APIXABAN 5 MG PO TABS: ORAL_TABLET | ORAL | 0 refills | Status: DC

## 2020-06-19 MED ORDER — ASCORBIC ACID 500 MG PO TABS
500.0000 mg | ORAL_TABLET | Freq: Every day | ORAL | 0 refills | Status: DC
Start: 2020-06-20 — End: 2022-11-28

## 2020-06-19 MED ORDER — PREDNISONE 20 MG PO TABS: ORAL_TABLET | ORAL | 0 refills | Status: DC

## 2020-06-19 MED ORDER — INSULIN GLARGINE 100 UNIT/ML ~~LOC~~ SOLN: 10.0000 [IU] | Freq: Two times a day (BID) | SUBCUTANEOUS | 11 refills | Status: DC

## 2020-06-19 MED ORDER — GUAIFENESIN-DM 100-10 MG/5ML PO SYRP: 10.0000 mL | ORAL_SOLUTION | ORAL | 0 refills | Status: DC | PRN

## 2020-06-19 NOTE — Discharge Summary (Signed)
[facility] [place] [facility]   PATIENT NAME: Cory [name]    MR#:  [id number]  DATE OF BIRTH:  [birth year]  DATE OF ADMISSION:  06/12/2020 ADMITTING PHYSICIAN: Hannah Beat, MD  DATE OF DISCHARGE: 06/19/2020  PRIMARY CARE PHYSICIAN: Rafael Bihari, MD    ADMISSION DIAGNOSIS:  Cough [R05] New onset a-fib (HCC) [I48.91] Generalized weakness [R53.1] Acute respiratory failure due to COVID-19 (HCC) [U07.1, J96.00] Hyperosmolar hyperglycemic state (HHS) (HCC) [E11.00, E11.65] Pneumonia due to COVID-19 virus [U07.1, J12.82] COVID-19 [U07.1]  DISCHARGE DIAGNOSIS:  Active Problems:   Pneumonia due to COVID-19 virus   Hyperosmolar hyperglycemic state (HHS) (HCC)   Acute respiratory failure due to COVID-19 (HCC)   Lethargy   Acute kidney injury superimposed on CKD (HCC)   AF (paroxysmal atrial fibrillation) (HCC)   Type 2 diabetes mellitus with hyperlipidemia (HCC)   Acute respiratory failure with hypoxia (HCC)   Generalized weakness   Dysphagia   Pulmonary embolus (HCC)   Hyponatremia   Hyperkalemia   SECONDARY DIAGNOSIS:   Past Medical History:  Diagnosis Date  . Diabetes mellitus without complication (HCC)   . OSA (obstructive sleep apnea)   . Thyroid disease     HOSPITAL COURSE:   1.  Acute pulmonary embolism without right heart strain.  Moderate clot burden mostly in the lower lobes described as saddlle.  Continue Eliquis 10 mg twice a day for another 5 days then can go back to 5 mg twice a day after that. 2.  Acute hypoxic respiratory failure secondary to COVID-19 pneumonia.  Patient down to 1 L of oxygen.  Hopefully can titrate off oxygen in the next couple days. 3.  Multifocal pneumonia secondary to COVID-19.  The patient received Pfizer vaccination back in January 2021.  The patient was given a full 5-day course of remdesivir and was also given baricitinib while here in the hospital.  This medication will be discontinued upon discharge.  The  patient was given Solu-Medrol.  He will complete 3 more days of prednisone and then can be discontinued.  CRP decreased from 18.1 down to 2.3.  Fibrin derivatives decreased down from greater than 7500 down to 5464. 4.  Lethargy at times.  The last few days I have caught him at a good time.  He usually is awake in the mornings and does sleep in the afternoons.  Physical therapy will be best done in the mornings. 5.  Type 2 diabetes mellitus with hyperlipidemia.  Sugars have been up and down depending on how much steroids have been given.  Continue Lantus 10 units twice a day with short acting insulin prior to meals.  Once the steroids are discontinued in 3 days must watch to see if sugars have gone too low.  Check fingersticks before every meal and nightly.  Of note the patient did take a lot larger dose of Lantus at home that he is taking here. 6.  Hyponatremia.  Sodium on the lower side.  Can have salt in the diet.  Do not give hydrochlorothiazide. 7.  Paroxysmal atrial fibrillation likely secondary to Covid in normal sinus rhythm now.  Anticoagulated with Eliquis. 8.  Dysphagia diet.  Dysphagia 2 diet with thin liquids.  Continued speech therapy following. 9.  Essential hypertension.  Held Accupril with hyperkalemia.  Blood pressure stable. 10.  Acute kidney injury on chronic kidney disease stage IIIb.  Last creatinine 1.44.  Continue to check weekly 11.  BPH on Flomax 12.  Weakness.  Physical therapy recommends rehab patient stable to go out to rehab today   DISCHARGE CONDITIONS:   Satisfactory  CONSULTS OBTAINED:  None  DRUG ALLERGIES:  No Known Allergies  DISCHARGE MEDICATIONS:   Allergies as of 06/19/2020   No Known Allergies     Medication List    STOP taking these medications   aspirin 81 MG EC tablet   Basaglar KwikPen 100 UNIT/ML Replaced by: insulin glargine 100 UNIT/ML injection   diclofenac sodium 1 % Gel Commonly known as: Voltaren   gabapentin 300 MG  capsule Commonly known as: NEURONTIN   HumaLOG KwikPen 100 UNIT/ML KwikPen Generic drug: insulin lispro   hydrochlorothiazide 12.5 MG tablet Commonly known as: HYDRODIURIL   lidocaine 5 % Commonly known as: LIDODERM   loperamide 2 MG capsule Commonly known as: IMODIUM   metFORMIN 500 MG 24 hr tablet Commonly known as: GLUCOPHAGE-XR   methocarbamol 500 MG tablet Commonly known as: ROBAXIN   ondansetron 4 MG disintegrating tablet Commonly known as: Zofran ODT   quinapril 20 MG tablet Commonly known as: ACCUPRIL   simvastatin 40 MG tablet Commonly known as: ZOCOR   traMADol 50 MG tablet Commonly known as: ULTRAM     TAKE these medications   apixaban 5 MG Tabs tablet Commonly known as: ELIQUIS Two tabs po twice a day for five days then one tab po twice a day afterwards   ascorbic acid 500 MG tablet Commonly known as: VITAMIN C Take 1 tablet (500 mg total) by mouth daily. Start taking on: June 20, 2020   co-enzyme Q-10 30 MG capsule Take 30 mg by mouth daily.   cyanocobalamin 1000 MCG tablet Take 1,000 mg by mouth daily.   guaiFENesin-dextromethorphan 100-10 MG/5ML syrup Commonly known as: ROBITUSSIN DM Take 10 mLs by mouth every 4 (four) hours as needed for cough.   insulin aspart 100 UNIT/ML injection Commonly known as: novoLOG Inject 4 Units into the skin 3 (three) times daily with meals.   insulin glargine 100 UNIT/ML injection Commonly known as: LANTUS Inject 0.1 mLs (10 Units total) into the skin 2 (two) times daily. Replaces: Basaglar KwikPen 100 UNIT/ML   predniSONE 20 MG tablet Commonly known as: DELTASONE Two tabs po daily for three days then stop   pyridoxine 100 MG tablet Commonly known as: B-6 Take 100 mg by mouth daily.   Synthroid 88 MCG tablet Generic drug: levothyroxine Take 1 tablet by mouth daily before breakfast.   tamsulosin 0.4 MG Caps capsule Commonly known as: FLOMAX Take 1 capsule by mouth daily.   vitamin E 1000  UNIT capsule Take 1,000 Units by mouth daily.   zinc sulfate 220 (50 Zn) MG capsule Take 1 capsule (220 mg total) by mouth daily. Start taking on: June 20, 2020        DISCHARGE INSTRUCTIONS:   Follow-up with Dr. At rehab 1 day  If you experience worsening of your admission symptoms, develop shortness of breath, life threatening emergency, suicidal or homicidal thoughts you must seek medical attention immediately by calling 911 or calling your MD immediately  if symptoms less severe.  You Must read complete instructions/literature along with all the possible adverse reactions/side effects for all the Medicines you take and that have been prescribed to you. Take any new Medicines after you have completely understood and accept all the possible adverse reactions/side effects.   Please note  You were cared for by a hospitalist during your hospital stay. If you have any questions about your discharge medications  or the care you received while you were in the hospital after you are discharged, you can call the unit and asked to speak with the hospitalist on call if the hospitalist that took care of you is not available. Once you are discharged, your primary care physician will handle any further medical issues. Please note that NO REFILLS for any discharge medications will be authorized once you are discharged, as it is imperative that you return to your primary care physician (or establish a relationship with a primary care physician if you do not have one) for your aftercare needs so that they can reassess your need for medications and monitor your lab values.    Today   CHIEF COMPLAINT:   Chief Complaint  Patient presents with  . Weakness    HISTORY OF PRESENT ILLNESS:  Mate Alegria  is a 86 y.o. male came in with weakness and found to have COVID-19 pneumonia   VITAL SIGNS:  Blood pressure 117/62, pulse 61, temperature 97.7 F (36.5 C), resp. rate 13, height 5\' 8"  (1.727 m),  weight 81.6 kg, SpO2 99 %.  I/O:    Intake/Output Summary (Last 24 hours) at 06/19/2020 1341 Last data filed at 06/19/2020 0950 Gross per 24 hour  Intake --  Output 2500 ml  Net -2500 ml    PHYSICAL EXAMINATION:  GENERAL:  86 y.o.-year-old patient lying in the bed with no acute distress.  EYES: Pupils equal, round, reactive to light and accommodation. No scleral icterus. HEENT: Head atraumatic, normocephalic. Oropharynx and nasopharynx clear.   LUNGS: Decreased breath sounds bilateral bases, no wheezing, rales,rhonchi or crepitation. No use of accessory muscles of respiration.  CARDIOVASCULAR: S1, S2 normal. No murmurs, rubs, or gallops.  ABDOMEN: Soft, non-tender.  EXTREMITIES: Trace pedal edema.  NEUROLOGIC: Cranial nerves II through XII are intact. Muscle strength 5/5 in all extremities. Sensation intact. Gait not checked.  PSYCHIATRIC: The patient is alert and oriented x 3.  SKIN: No obvious rash, lesion, or ulcer.   DATA REVIEW:   CBC Recent Labs  Lab 06/19/20 0458  WBC 13.1*  HGB 15.0  HCT 43.4  PLT 349    Chemistries  Recent Labs  Lab 06/17/20 0635 06/17/20 1249 06/18/20 0422  NA 128*  --  129*  K 5.6*  --  4.0  CL 93*  --  93*  CO2 27  --  24  GLUCOSE 233*   < > 68*  BUN 33*  --  37*  CREATININE 1.29*  --  1.44*  CALCIUM 9.3  --  9.2  AST 47*  --   --   ALT 39  --   --   ALKPHOS 86  --   --   BILITOT 0.7  --   --    < > = values in this interval not displayed.     Microbiology Results  Results for orders placed or performed during the hospital encounter of 06/12/20  Respiratory Panel by RT PCR (Flu A&B, Covid) - Nasopharyngeal Swab     Status: Abnormal   Collection Time: 06/12/20  1:08 AM   Specimen: Nasopharyngeal Swab  Result Value Ref Range Status   SARS Coronavirus 2 by RT PCR POSITIVE (A) NEGATIVE Final    Comment: RESULT CALLED TO, READ BACK BY AND VERIFIED WITH: BRIANNA CHAPMON @ 0234 06/12/2020 RH (NOTE) SARS-CoV-2 target nucleic acids  are DETECTED.  SARS-CoV-2 RNA is generally detectable in upper respiratory specimens  during the acute phase of infection. Positive results are  indicative of the presence of the identified virus, but do not rule out bacterial infection or co-infection with other pathogens not detected by the test. Clinical correlation with patient history and other diagnostic information is necessary to determine patient infection status. The expected result is Negative.  Fact Sheet for Patients:  https://www.moore.com/  Fact Sheet for Healthcare Providers: https://www.young.biz/  This test is not yet approved or cleared by the [place] FDA and  has been authorized for detection and/or diagnosis of SARS-CoV-2 by FDA under an Emergency Use Authorization (EUA).  This EUA will remain in effect (meaning this test can be  used) for the duration of  the COVID-19 declaration under Section 564(b)(1) of the Act, 21 U.S.C. section 360bbb-3(b)(1), unless the authorization is terminated or revoked sooner.      Influenza A by PCR NEGATIVE NEGATIVE Final   Influenza B by PCR NEGATIVE NEGATIVE Final    Comment: (NOTE) The Xpert Xpress SARS-CoV-2/FLU/RSV assay is intended as an aid in  the diagnosis of influenza from Nasopharyngeal swab specimens and  should not be used as a sole basis for treatment. Nasal washings and  aspirates are unacceptable for Xpert Xpress SARS-CoV-2/FLU/RSV  testing.  Fact Sheet for Patients: https://www.moore.com/  Fact Sheet for Healthcare Providers: https://www.young.biz/  This test is not yet approved or cleared by the [place] FDA and  has been authorized for detection and/or diagnosis of SARS-CoV-2 by  FDA under an Emergency Use Authorization (EUA). This EUA will remain  in effect (meaning this test can be used) for the duration of the  Covid-19 declaration under Section 564(b)(1) of the  Act, 21  U.S.C. section 360bbb-3(b)(1), unless the authorization is  terminated or revoked. Performed at [facility], [address], [place], [place] [address]       Management plans discussed with the patient, and he has in agreement.  Spoke with family yesterday about potential out of the hospital today.  Left message for family today.  CODE STATUS:     Code Status Orders  (From admission, onward)         Start     Ordered   06/12/20 0808  Do not attempt resuscitation (DNR)  Continuous       Question Answer Comment  In the event of cardiac or respiratory ARREST Do not call a "code blue"   In the event of cardiac or respiratory ARREST Do not perform Intubation, CPR, defibrillation or ACLS   In the event of cardiac or respiratory ARREST Use medication by any route, position, wound care, and other measures to relive pain and suffering. May use oxygen, suction and manual treatment of airway obstruction as needed for comfort.      06/12/20 0807        Code Status History    Date Active Date Inactive Code Status Order ID Comments User Context   06/12/2020 0327 06/12/2020 0807 Full Code [id number]  [facility] Vernetta Honey, MD ED   10/18/2016 0015 10/20/2016 1906 Full Code [id number]  Tonye Royalty, DO Inpatient   Advance Care Planning Activity      TOTAL TIME TAKING CARE OF THIS PATIENT: 35 minutes.    Alford Highland M.D on 06/19/2020 at 1:41 PM  Between 7am to 6pm - Pager - [phone]  After 6pm go to [email or web address] - password [email or web address]  [facility]  CC: Primary care physician; Rafael Bihari, MD

## 2020-06-19 NOTE — Progress Notes (Signed)
Inpatient Diabetes Program Recommendations  AACE/ADA: New Consensus Statement on Inpatient Glycemic Control   Target Ranges:  Prepandial:   less than 140 mg/dL      Peak postprandial:   less than 180 mg/dL (1-2 hours)      Critically ill patients:  140 - 180 mg/dL   Results for KYMARI, NUON (MRN [id number]) as of 06/19/2020 10:02  Ref. Range 06/18/2020 07:43 06/18/2020 11:47 06/18/2020 16:07 06/18/2020 19:17 06/19/2020 08:16  Glucose-Capillary Latest Ref Range: 70 - 99 mg/dL 334 (H) 356 (H) 861 (H) 211 (H) 103 (H)   Review of Glycemic Control  Current orders for Inpatient glycemic control: Lantus 10 units BID, 0-20 units TID with meals, Novolog 0-5 units QHS, Novolog 4 units TID with meals; Prednisone 40 mg QAM  NOTE:  Patient received Lantus 20 units a 10:09 am and Lantus 10 units at 20:07 on 06/18/20.  Noted Lantus and Novolog meal coverage decreased on 06/18/20. Fasting glucose 103 mg/dl this morning. Will follow along while inpatient.  Thanks, Orlando Penner, RN, MSN, CDE Diabetes Coordinator Inpatient Diabetes Program [phone] (Team Pager from 8am to 5pm)

## 2020-06-19 NOTE — Care Management Important Message (Signed)
Important Message  Patient Details  Name: Cory [name] MRN: [id number] Date of Birth: Sep 06, 1931   Medicare Important Message Given:  Other (see comment)  Attempted to review Medicare IM over room phone with patient due to isolation status, however no answer.     [name] 06/19/2020, 12:59 PM

## 2020-06-19 NOTE — Progress Notes (Signed)
Report called in to LPN Jody at receiving facility, [facility]. Transport picked up patient at 1600. Pt alert and oriented and updated on POC

## 2020-06-19 NOTE — Progress Notes (Signed)
Physical Therapy Treatment Patient Details Name: Cory [name] MRN: [id number] DOB: 1931/05/29 Today's Date: 06/19/2020    History of Present Illness  86 y.o. Caucasian male with a known history of type II diabetes mellitus, obstructive sleep apnea and hypothyroidism, presented to the emergency room with acute onset of generalized weakness with associated dry cough.  Pt tested positive for Covid 19, has been vaccinated.    PT Comments    Pt was long sitting in bed finishing breakfast upon arriving. He is Alert and able to follow commands however has some disorientation about situation. On 1 L o2 throughout. Max encouragement to get OOB however pt was unwilling. Did agree to performing bed level there ex. See exercises listed below. Pt will need extensive PT going forward to return to PLOF. Recommend SNF at DC to address deficits and improve safe functional mobility.    Follow Up Recommendations  SNF     Equipment Recommendations  Other (comment) (defer to next level of care)       Precautions / Restrictions Precautions Precautions: Fall Restrictions Weight Bearing Restrictions: No    Mobility  Bed Mobility      General bed mobility comments: Pt was unwilling to get OOB however agreeable to ther ex in bed.         Cognition Arousal/Alertness: Awake/alert Behavior During Therapy: Flat affect Overall Cognitive Status: History of cognitive impairments - at baseline      General Comments: Pt is much more slert this date than previously observed. Pt unwilling to get OOB even with max encouragement.      Exercises General Exercises - Lower Extremity Ankle Circles/Pumps: AROM;Both;20 reps Quad Sets: AROM;Both;10 reps Gluteal Sets: AROM;10 reps Short Arc Quad: AAROM;Both;10 reps Heel Slides: AROM;10 reps;Both Hip ABduction/ADduction: AAROM;10 reps;Both;Supine Straight Leg Raises: AAROM;Both;10 reps        Pertinent Vitals/Pain Pain Assessment: No/denies pain Pain Score:  0-No pain Pain Location:  (no Pain with I'm in gbed)           PT Goals (current goals can now be found in the care plan section) Acute Rehab PT Goals Patient Stated Goal: Get better so I can go see my wiife Progress towards PT goals: Progressing toward goals    Frequency    Min 2X/week      PT Plan Current plan remains appropriate       AM-PAC PT "6 Clicks" Mobility   Outcome Measure  Help needed turning from your back to your side while in a flat bed without using bedrails?: A Lot Help needed moving from lying on your back to sitting on the side of a flat bed without using bedrails?: A Lot Help needed moving to and from a bed to a chair (including a wheelchair)?: Total Help needed standing up from a chair using your arms (e.g., wheelchair or bedside chair)?: Total Help needed to walk in hospital room?: Total Help needed climbing 3-5 steps with a railing? : Total 6 Click Score: 8    End of Session Equipment Utilized During Treatment: Gait belt;Oxygen (1 L o2) Activity Tolerance: Patient tolerated treatment well;Other (comment) (slowed progress 2/2 tro pt's limited participation) Patient left: in bed;with call bell/phone within reach Nurse Communication: Mobility status PT Visit Diagnosis: Muscle weakness (generalized) (M62.81);Unsteadiness on feet (R26.81);Difficulty in walking, not elsewhere classified (R26.2)     Time: 5681-2751 PT Time Calculation (min) (ACUTE ONLY): 14 min  Charges:  $Therapeutic Exercise: 8-22 mins  Jetta Lout PTA 06/19/20, 10:02 AM

## 2020-06-19 NOTE — TOC Transition Note (Signed)
Transition of Care [facility]) - CM/SW Discharge Note   Patient Details  Name: ADA WOODBURY MRN: [id number] Date of Birth: 1930/12/22  [facility] CM/SW Contact:  Allayne Butcher, RN Phone Number: 06/19/2020, 1:43 PM   Clinical Narrative:    Patient is medically cleared for discharge to [facility].  Patient's step daughter, [name] updated on discharge plan this morning.  [name] notified this afternoon that [facility] bed and patient is scheduled for transport at 4pm this afternoon.  Patient is going to room 808.  Bedside RN will call report to [phone].     Final next level of care: Skilled Nursing Facility Barriers to Discharge: Barriers Resolved   Patient Goals and CMS Choice Patient states their goals for this hospitalization and ongoing recovery are:: Seeking SNF placement CMS Medicare.gov Compare Post Acute Care list provided to:: Patient Represenative (must comment) Choice offered to / list presented to : Adult Children [name])  Discharge Placement              Patient [facility] bed at: [facility] Patient to be transferred to facility by: [facility] Name of family member notified: [name] Patient and family notified of of transfer: 06/19/20  Discharge Plan and Services                                     Social Determinants of Health (SDOH) Interventions     Readmission Risk Interventions No flowsheet data found.

## 2020-07-31 ENCOUNTER — Encounter (HOSPITAL_COMMUNITY): Payer: Self-pay

## 2020-07-31 ENCOUNTER — Emergency Department (HOSPITAL_COMMUNITY)
Admission: EM | Admit: 2020-07-31 | Discharge: 2020-07-31 | Disposition: A | Discharge status: Home / Self Care | Payer: Medicare Other | Attending: Emergency Medicine | Admitting: Emergency Medicine

## 2020-07-31 ENCOUNTER — Emergency Department (HOSPITAL_COMMUNITY): Payer: Medicare Other

## 2020-07-31 DIAGNOSIS — E1122 Type 2 diabetes mellitus with diabetic chronic kidney disease: Secondary | ICD-10-CM | POA: Insufficient documentation

## 2020-07-31 DIAGNOSIS — Z8616 Personal history of COVID-19: Secondary | ICD-10-CM | POA: Insufficient documentation

## 2020-07-31 DIAGNOSIS — I480 Paroxysmal atrial fibrillation: Secondary | ICD-10-CM | POA: Insufficient documentation

## 2020-07-31 DIAGNOSIS — Z794 Long term (current) use of insulin: Secondary | ICD-10-CM | POA: Diagnosis not present

## 2020-07-31 DIAGNOSIS — Z7901 Long term (current) use of anticoagulants: Secondary | ICD-10-CM | POA: Diagnosis not present

## 2020-07-31 DIAGNOSIS — N189 Chronic kidney disease, unspecified: Secondary | ICD-10-CM | POA: Insufficient documentation

## 2020-07-31 DIAGNOSIS — I493 Ventricular premature depolarization: Secondary | ICD-10-CM | POA: Insufficient documentation

## 2020-07-31 DIAGNOSIS — R001 Bradycardia, unspecified: Secondary | ICD-10-CM | POA: Diagnosis present

## 2020-07-31 LAB — COMPREHENSIVE METABOLIC PANEL
ALT: 12 U/L (ref 0–44)
AST: 40 U/L (ref 15–41)
Albumin: 2.5 g/dL — ABNORMAL LOW (ref 3.5–5.0)
Alkaline Phosphatase: 93 U/L (ref 38–126)
Anion gap: 11 (ref 5–15)
BUN: 8 mg/dL (ref 8–23)
CO2: 25 mmol/L (ref 22–32)
Calcium: 9.3 mg/dL (ref 8.9–10.3)
Chloride: 96 mmol/L — ABNORMAL LOW (ref 98–111)
Creatinine, Ser: 1.18 mg/dL (ref 0.61–1.24)
GFR, Estimated: 60 mL/min — ABNORMAL LOW (ref >60)
Glucose, Bld: 142 mg/dL — ABNORMAL HIGH (ref 70–99)
Potassium: 5.2 mmol/L — ABNORMAL HIGH (ref 3.5–5.1)
Sodium: 132 mmol/L — ABNORMAL LOW (ref 135–145)
Total Bilirubin: 1 mg/dL (ref 0.3–1.2)
Total Protein: 6.5 g/dL (ref 6.5–8.1)

## 2020-07-31 LAB — CBC WITH DIFFERENTIAL/PLATELET
Abs Immature Granulocytes: 0.01 10*3/uL (ref 0.00–0.07)
Basophils Absolute: 0.1 10*3/uL (ref 0.0–0.1)
Basophils Relative: 1 %
Eosinophils Absolute: 0.4 10*3/uL (ref 0.0–0.5)
Eosinophils Relative: 6 %
HCT: 44.8 % (ref 39.0–52.0)
Hemoglobin: 14.1 g/dL (ref 13.0–17.0)
Immature Granulocytes: 0 %
Lymphocytes Relative: 42 %
Lymphs Abs: 3.3 10*3/uL (ref 0.7–4.0)
MCH: 29 pg (ref 26.0–34.0)
MCHC: 31.5 g/dL (ref 30.0–36.0)
MCV: 92 fL (ref 80.0–100.0)
Monocytes Absolute: 0.6 10*3/uL (ref 0.1–1.0)
Monocytes Relative: 9 %
Neutro Abs: 3.2 10*3/uL (ref 1.7–7.7)
Neutrophils Relative %: 42 %
Platelets: 332 10*3/uL (ref 150–400)
RBC: 4.87 MIL/uL (ref 4.22–5.81)
RDW: 14.6 % (ref 11.5–15.5)
WBC: 7.6 10*3/uL (ref 4.0–10.5)
nRBC: 0 % (ref 0.0–0.2)

## 2020-07-31 LAB — MAGNESIUM: Magnesium: 1.8 mg/dL (ref 1.7–2.4)

## 2020-07-31 LAB — TSH: TSH: 4.683 u[IU]/mL — ABNORMAL HIGH (ref 0.350–4.500)

## 2020-07-31 IMAGING — DX DG CHEST 1V PORT
1 series · 1 of 1 positions shown · non-contrast
Comparison: [DATE].

CLINICAL DATA: Irregular heart rate.

EXAM:
PORTABLE CHEST 1 VIEW

[chest ap]
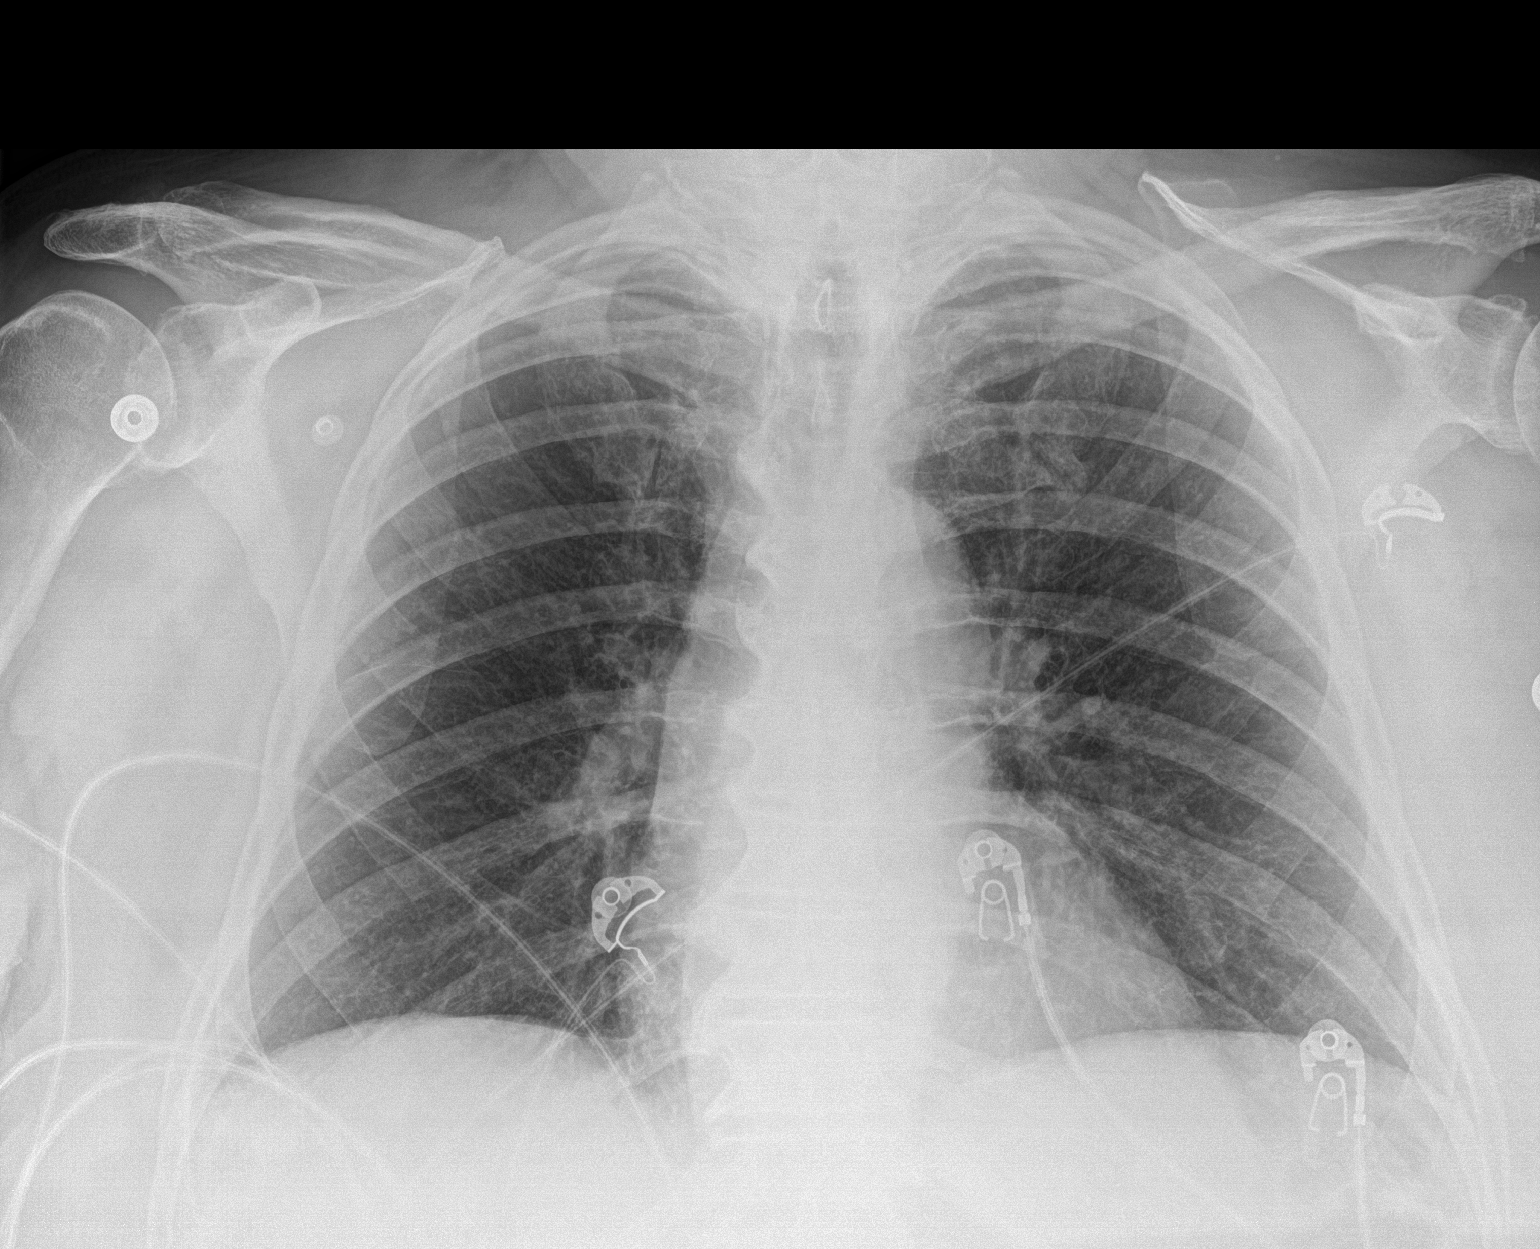

[1 of 1 positions shown; findings below may reference images not displayed]

FINDINGS: The heart size and mediastinal contours are within normal limits. No
consolidation. No visible pleural effusions or pneumothorax. The
visualized skeletal structures are unremarkable.
IMPRESSION: No acute cardiopulmonary disease.

## 2020-07-31 NOTE — ED Notes (Signed)
Pt d/c by MD and is provided w/ d/c instructions and follow up care, Pt is out of the ED by PTAR  

## 2020-07-31 NOTE — ED Provider Notes (Signed)
[facility] EMERGENCY DEPARTMENT Provider Note   CSN: [id number] Arrival date & time: 07/31/20  1604     History Chief Complaint  Patient presents with  . Irregular Heart Beat    Cory [name] is a 86 y.o. male.  Patient is an 86 year old male with a history of PE, CKD, paroxysmal atrial fibrillation, diabetes, recent Covid infection in the setting of being vaccinated who is presenting today from the rehab facility due to irregular pulse and bradycardia.  Patient reports that he feels fine.  He reports his symptoms with Covid were very mild and he had some minor congestion and occasional cough but he has not had shortness of breath he has been eating well he denies any chest pain, leg swelling, abdominal pain, nausea, vomiting or diarrhea.  He reports he has been taking a lot of medications every day but is not aware if any of them have been changed.  He reports he has been at rehab now for 29 days and was planning to go home soon.  Patient does take Eliquis for his atrial fibrillation and prior PEs.  He states sometimes he has to take a extra deep breath but does not feel short of breath at baseline.  He denies any recent syncope or lightheadedness. EMS reports that patient's heart rate has been irregular.  He has had frequent PVCs and occasional runs of 3-4 PVCs and occasional pauses.  The history is provided by the patient, the EMS personnel and medical records.       Past Medical History:  Diagnosis Date  . Diabetes mellitus without complication (HCC)   . OSA (obstructive sleep apnea)   . Thyroid disease     Patient Active Problem List   Diagnosis Date Noted  . Pulmonary embolus (HCC)   . Hyponatremia   . Hyperkalemia   . Dysphagia   . Acute respiratory failure with hypoxia (HCC)   . Generalized weakness   . Lethargy   . Acute kidney injury superimposed on CKD (HCC)   . AF (paroxysmal atrial fibrillation) (HCC)   . Type 2 diabetes mellitus with  hyperlipidemia (HCC)   . Acute respiratory failure due to COVID-19 (HCC) 06/13/2020  . Pneumonia due to COVID-19 virus 06/12/2020  . Hyperosmolar hyperglycemic state (HHS) (HCC) 06/12/2020  . Intractable pain 10/17/2016    History reviewed. No pertinent surgical history.     History reviewed. No pertinent family history.  Social History   Tobacco Use  . Smoking status: Never Smoker  . Smokeless tobacco: Never Used  Substance Use Topics  . Alcohol use: Yes  . Drug use: Not on file    Home Medications Prior to Admission medications   Medication Sig Start Date End Date Taking? Authorizing Provider  apixaban (ELIQUIS) 5 MG TABS tablet Two tabs po twice a day for five days then one tab po twice a day afterwards 06/19/20   Alford Highland, MD  ascorbic acid (VITAMIN C) 500 MG tablet Take 1 tablet (500 mg total) by mouth daily. 06/20/20   Alford Highland, MD  co-enzyme Q-10 30 MG capsule Take 30 mg by mouth daily.    [provider]  cyanocobalamin 1000 MCG tablet Take 1,000 mg by mouth daily.    [provider]  guaiFENesin-dextromethorphan (ROBITUSSIN DM) 100-10 MG/5ML syrup Take 10 mLs by mouth every 4 (four) hours as needed for cough. 06/19/20   Alford Highland, MD  insulin aspart (NOVOLOG) 100 UNIT/ML injection Inject 4 Units into the skin  3 (three) times daily with meals. 06/19/20   Alford Highland, MD  insulin glargine (LANTUS) 100 UNIT/ML injection Inject 0.1 mLs (10 Units total) into the skin 2 (two) times daily. 06/19/20   Alford Highland, MD  predniSONE (DELTASONE) 20 MG tablet Two tabs po daily for three days then stop 06/19/20   Alford Highland, MD  pyridoxine (B-6) 100 MG tablet Take 100 mg by mouth daily.    [provider]  SYNTHROID 88 MCG tablet Take 1 tablet by mouth daily before breakfast.  09/26/16   [provider]  tamsulosin (FLOMAX) 0.4 MG CAPS capsule Take 1 capsule by mouth daily. 07/29/16   [provider]  vitamin  E 1000 UNIT capsule Take 1,000 Units by mouth daily.    [provider]  zinc sulfate 220 (50 Zn) MG capsule Take 1 capsule (220 mg total) by mouth daily. 06/20/20   Alford Highland, MD    Allergies    Patient has no known allergies.  Review of Systems   Review of Systems  Constitutional: Negative for fever.  HENT: Negative for ear pain, rhinorrhea and voice change.   Respiratory: Positive for cough. Negative for chest tightness, shortness of breath and wheezing.   Cardiovascular: Negative.  Negative for chest pain.  Gastrointestinal: Negative for abdominal pain, diarrhea, nausea and vomiting.  Skin: Negative for rash.  All other systems reviewed and are negative.   Physical Exam Updated Vital Signs BP 104/62   Pulse 87   Temp 98.1 F (36.7 C) (Oral)   Resp 17   Ht 5\' 8"  (1.727 m)   Wt 108.9 kg   SpO2 97%   BMI 36.49 kg/m   Physical Exam Vitals and nursing note reviewed.  Constitutional:      General: He is not in acute distress.    Appearance: He is well-developed. He is obese.  HENT:     Head: Normocephalic and atraumatic.  Eyes:     Conjunctiva/sclera: Conjunctivae normal.     Pupils: Pupils are equal, round, and reactive to light.  Cardiovascular:     Rate and Rhythm: Normal rate. Rhythm irregularly irregular.     Heart sounds: No murmur heard.   Pulmonary:     Effort: Pulmonary effort is normal. No respiratory distress.     Breath sounds: Examination of the right-lower field reveals rales. Examination of the left-lower field reveals rales. Rales present. No wheezing.  Abdominal:     General: There is no distension.     Palpations: Abdomen is soft.     Tenderness: There is no abdominal tenderness. There is no guarding or rebound.  Musculoskeletal:        General: No tenderness. Normal range of motion.     Cervical back: Normal range of motion and neck supple.  Skin:    General: Skin is warm and dry.     Findings: No erythema or rash.    Neurological:     Mental Status: He is alert and oriented to person, place, and time.  Psychiatric:        Mood and Affect: Mood normal.        Behavior: Behavior normal.        Thought Content: Thought content normal.     ED Results / Procedures / Treatments   Labs (all labs ordered are listed, but only abnormal results are displayed) Labs Reviewed  COMPREHENSIVE METABOLIC PANEL - Abnormal; Notable for the following components:      Result Value  Sodium 132 (*)    Potassium 5.2 (*)    Chloride 96 (*)    Glucose, Bld 142 (*)    Albumin 2.5 (*)    GFR, Estimated 60 (*)    All other components within normal limits  TSH - Abnormal; Notable for the following components:   TSH 4.683 (*)    All other components within normal limits  CBC WITH DIFFERENTIAL/PLATELET  MAGNESIUM    EKG EKG Interpretation  Date/Time:  Tuesday July 31 2020 16:13:48 EDT Ventricular Rate:  105 PR Interval:    QRS Duration: 122 QT Interval:  420 QTC Calculation: 372 R Axis:   -50 Text Interpretation: persistent Atrial flutter/fibrillation Right bundle branch block Confirmed by Gwyneth Sprout ([id number]) on 07/31/2020 4:22:15 PM   Radiology DG Chest Port 1 View  Result Date: 07/31/2020 CLINICAL DATA:  Irregular heart rate. EXAM: PORTABLE CHEST 1 VIEW COMPARISON:  06/16/2020. FINDINGS: The heart size and mediastinal contours are within normal limits. No consolidation. No visible pleural effusions or pneumothorax. The visualized skeletal structures are unremarkable. IMPRESSION: No acute cardiopulmonary disease. Electronically Signed   By: Feliberto Harts MD   On: 07/31/2020 16:57    Procedures Procedures (including critical care time)  Medications Ordered in ED Medications - No data to display  ED Course  I have reviewed the triage vital signs and the nursing notes.  Pertinent labs & imaging results that were available during my care of the patient were reviewed by me and considered in my  medical decision making (see chart for details).    MDM Rules/Calculators/A&P                          Elderly male presenting today from his rehab facility due to irregular heart rate.  Is asymptomatic and reports they just came into check his heart rate today and it was low.  He denied feeling any palpitations, chest pain or shortness of breath.  Patient recently had been diagnosed with Covid about 10 days ago.  He reports his symptoms have been very mild as he did receive the vaccine.  He does take anticoagulation for history of PE and paroxysmal atrial fibrillation.  On exam patient is well-appearing.  Heart rate and blood pressure are normal.  Patient is satting 97% on room air.  There is no evidence of fluid overload at this time.  There is no abdominal distention or distal edema.  Patient does have rales on exam and suspect there may be some Covid pneumonia. However will check labs and continue to monitor patient.  Patient's EKG shows atrial fibrillation with right bundle branch block which is not significantly changed from prior.  However patient has had several runs of PVCs sometimes 3-4 at a time.  He is asymptomatic during these events.  8:01 PM  Patient's labs with a normal magnesium, CMP with mild elevation of potassium of 5.2 but most likely hemolysis as patient's creatinine is normal, TSH only minimally elevated at 4.6 but patient will need to have TSH rechecked in the next 1 month and CBC within normal limits.  Patient's chest x-ray is within normal limits.  Patient has now been here for approximately 4 hours.  His rhythm has not changed his remained in atrial fibrillation with frequent PVCs.  He remains asymptomatic at this time.  Vital signs remained stable.  Findings discussed with the patient and with his stepdaughter and at this time does not appear to have an acute  issue.  Did encourage him to follow-up with his cardiologist Dr. Laural BenesJohnson for repeat evaluation.  Patient at this time  does not take any blood pressure medication or rate control medications.  At this time he has remained rate controlled.  Did discuss with the patient that if he develops any chest pain, shortness of breath, palpitations, syncope or other concerns he should return to the emergency room for further evaluation.  MDM Number of Diagnoses or Management Options   Amount and/or Complexity of Data Reviewed Clinical lab tests: ordered and reviewed Tests in the radiology section of CPT: ordered and reviewed Tests in the medicine section of CPT: ordered and reviewed Decide to obtain previous medical records or to obtain history from someone other than the patient: yes Obtain history from someone other than the patient: yes Review and summarize past medical records: yes Discuss the patient with other providers: no Independent visualization of images, tracings, or specimens: yes  Risk of Complications, Morbidity, and/or Mortality Presenting problems: low Diagnostic procedures: minimal Management options: minimal  Patient Progress Patient progress: stable    Final Clinical Impression(s) / ED Diagnoses Final diagnoses:  Paroxysmal atrial fibrillation (HCC)  PVC's (premature ventricular contractions)    Rx / DC Orders ED Discharge Orders    None       Gwyneth SproutPlunkett, Jakwan Sally, MD 07/31/20 2005

## 2020-07-31 NOTE — Discharge Instructions (Signed)
Will need repeat TSH in 1 month.  Labs otherwise normal.  Heart rate here has been between 80-90 with A. Fib with frequent PVC's.  No acute issues and normal blood pressure and O2 sats.  Continue current medications.  Follow up with cardiologist in 1-2 weeks and return if develops chest pain, shortness of breath, passing out or lightheaded or change in mental status.

## 2020-07-31 NOTE — ED Notes (Addendum)
This RN observe 3-4 runs of [facility] and notified MD, MD reports continue to monitor for runs of 6 no zoll pads needed at this time due to pt being asymptomatic   1815 This rn place zoll pads for precaution

## 2020-07-31 NOTE — ED Triage Notes (Addendum)
Assume care from EMS, EMS reports pt is comimng from [facility] where staff reported pt was bradycardic with irregular HR, EMS states in route possible runs of [facility]. Ems states pt had covid about 10 days ago. And an hx of PE and afib. Upon arrival to ED pt is asymptomatic, Pt denies nausea, Vomiting, chest pain , SOB,. Pt is place on the monitor x 3, EKG obtain   BP  150/70

## 2022-03-17 ENCOUNTER — Emergency Department: Payer: Medicare Other

## 2022-03-17 ENCOUNTER — Emergency Department
Admission: EM | Admit: 2022-03-17 | Discharge: 2022-03-17 | Disposition: A | Discharge status: Home / Self Care | Payer: Medicare Other | Attending: Emergency Medicine | Admitting: Emergency Medicine

## 2022-03-17 ENCOUNTER — Other Ambulatory Visit: Payer: Self-pay

## 2022-03-17 DIAGNOSIS — Z7901 Long term (current) use of anticoagulants: Secondary | ICD-10-CM | POA: Diagnosis not present

## 2022-03-17 DIAGNOSIS — G459 Transient cerebral ischemic attack, unspecified: Secondary | ICD-10-CM | POA: Diagnosis not present

## 2022-03-17 DIAGNOSIS — I4891 Unspecified atrial fibrillation: Secondary | ICD-10-CM | POA: Diagnosis not present

## 2022-03-17 DIAGNOSIS — I639 Cerebral infarction, unspecified: Secondary | ICD-10-CM | POA: Diagnosis not present

## 2022-03-17 DIAGNOSIS — R531 Weakness: Secondary | ICD-10-CM

## 2022-03-17 DIAGNOSIS — R29810 Facial weakness: Secondary | ICD-10-CM | POA: Insufficient documentation

## 2022-03-17 DIAGNOSIS — R4701 Aphasia: Secondary | ICD-10-CM | POA: Diagnosis present

## 2022-03-17 LAB — DIFFERENTIAL
Abs Immature Granulocytes: 0.02 10*3/uL (ref 0.00–0.07)
Basophils Absolute: 0.1 10*3/uL (ref 0.0–0.1)
Basophils Relative: 1 %
Eosinophils Absolute: 0.4 10*3/uL (ref 0.0–0.5)
Eosinophils Relative: 5 %
Immature Granulocytes: 0 %
Lymphocytes Relative: 52 %
Lymphs Abs: 4.4 10*3/uL — ABNORMAL HIGH (ref 0.7–4.0)
Monocytes Absolute: 0.5 10*3/uL (ref 0.1–1.0)
Monocytes Relative: 6 %
Neutro Abs: 3 10*3/uL (ref 1.7–7.7)
Neutrophils Relative %: 36 %

## 2022-03-17 LAB — CBC
HCT: 44.2 % (ref 39.0–52.0)
Hemoglobin: 14.3 g/dL (ref 13.0–17.0)
MCH: 30.4 pg (ref 26.0–34.0)
MCHC: 32.4 g/dL (ref 30.0–36.0)
MCV: 93.8 fL (ref 80.0–100.0)
Platelets: 227 10*3/uL (ref 150–400)
RBC: 4.71 MIL/uL (ref 4.22–5.81)
RDW: 14 % (ref 11.5–15.5)
WBC: 8.4 10*3/uL (ref 4.0–10.5)
nRBC: 0 % (ref 0.0–0.2)

## 2022-03-17 LAB — COMPREHENSIVE METABOLIC PANEL
ALT: 14 U/L (ref 0–44)
AST: 29 U/L (ref 15–41)
Albumin: 3 g/dL — ABNORMAL LOW (ref 3.5–5.0)
Alkaline Phosphatase: 63 U/L (ref 38–126)
Anion gap: 7 (ref 5–15)
BUN: 15 mg/dL (ref 8–23)
CO2: 26 mmol/L (ref 22–32)
Calcium: 9 mg/dL (ref 8.9–10.3)
Chloride: 102 mmol/L (ref 98–111)
Creatinine, Ser: 0.96 mg/dL (ref 0.61–1.24)
GFR, Estimated: >60 mL/min (ref >60)
Glucose, Bld: 121 mg/dL — ABNORMAL HIGH (ref 70–99)
Potassium: 4.4 mmol/L (ref 3.5–5.1)
Sodium: 135 mmol/L (ref 135–145)
Total Bilirubin: 0.6 mg/dL (ref 0.3–1.2)
Total Protein: 7.1 g/dL (ref 6.5–8.1)

## 2022-03-17 LAB — PROTIME-INR
INR: 1.6 — ABNORMAL HIGH (ref 0.8–1.2)
Prothrombin Time: 18.7 seconds — ABNORMAL HIGH (ref 11.4–15.2)

## 2022-03-17 LAB — APTT: aPTT: 50 seconds — ABNORMAL HIGH (ref 24–36)

## 2022-03-17 IMAGING — MR MR HEAD W/O CM
12 series · 48 of 48 positions shown · IV contrast (gadavist)
Comparison: Head CT [DATE]

CLINICAL DATA: Transient facial droop and aphasia.

EXAM:
MRI HEAD WITHOUT CONTRAST
MRA HEAD WITHOUT CONTRAST
MRA OF THE NECK WITHOUT AND WITH CONTRAST
TECHNIQUE: Multiplanar, multi-echo pulse sequences of the brain and surrounding
structures were acquired without intravenous contrast. Angiographic
images of the Circle of Willis were acquired using MRA technique
without intravenous contrast. Angiographic images of the neck were
acquired using MRA technique without and with intravenous contrast.
Carotid stenosis measurements (when applicable) are obtained
utilizing NASCET criteria, using the distal internal carotid
diameter as the denominator.
CONTRAST:  10mL GADAVIST GADOBUTROL 1 MMOL/ML IV SOLN

[Series 5: ax dwi_tracew · axial · 3.0mm · 1.80mm/px · z∈[+1,+153]mm · 3 of 48 slices shown]
[im 1/48]
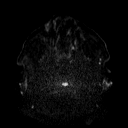
[im 24/48]
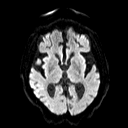
[im 48/48]
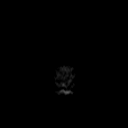

[Series 6: ax dwi_adc · axial · 3.0mm · 1.80mm/px · z∈[+1,+153]mm · 4 of 48 slices shown]
[im 1/48]
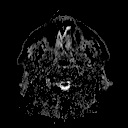
[im 16/48]
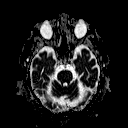
[im 32/48]
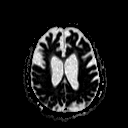
[im 48/48]
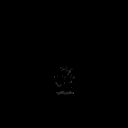

[Series 7: cor dwi_tracew · coronal · 5.0mm · 1.80mm/px · 3 of 38 slices shown]
[im 1/38]
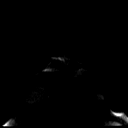
[im 19/38]
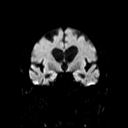
[im 38/38]
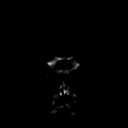

[Series 8: cor dwi_adc · coronal · 5.0mm · 1.80mm/px · 3 of 38 slices shown]
[im 1/38]
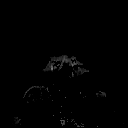
[im 19/38]
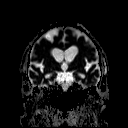
[im 38/38]
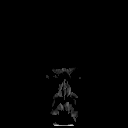

[Series 9: T1 · sagittal · 5.0mm · 0.62mm/px · 2 of 23 slices shown (1 of 2)]
[im 1/23]
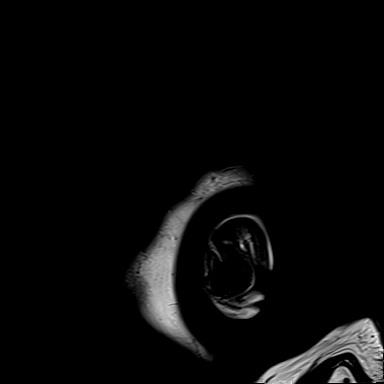
[im 23/23]
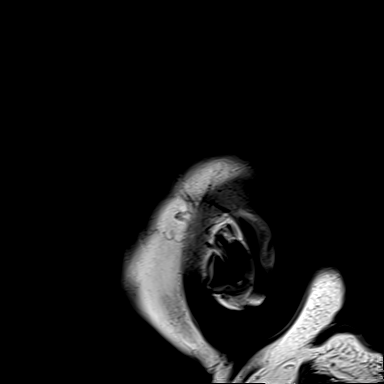

[Series 10: T2 · axial · 5.0mm · 0.86mm/px · z∈[+5,+147]mm · 2 of 25 slices shown (1 of 2)]
[im 1/25]
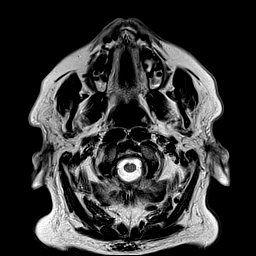
[im 25/25]
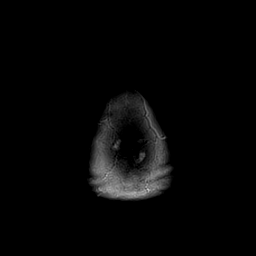

[Series 11: mag_images · axial · 3.0mm · 0.90mm/px · z∈[+2,+152]mm · 4 of 52 slices shown]
[im 1/52]
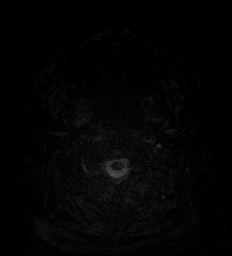
[im 18/52]
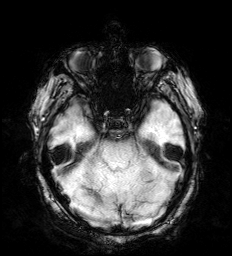
[im 35/52]
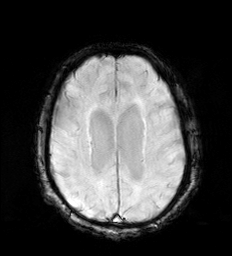
[im 52/52]
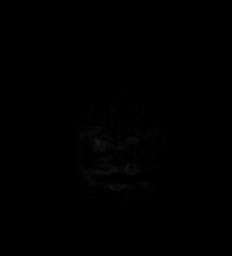

[Series 12: pha_images · axial · 3.0mm · 0.90mm/px · z∈[+2,+152]mm · 4 of 52 slices shown]
[im 1/52]
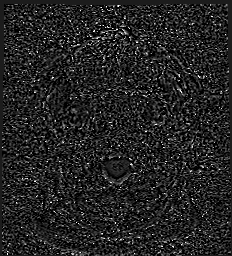
[im 18/52]
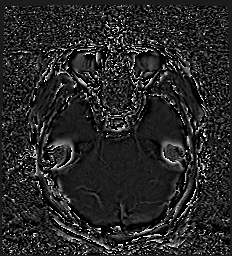
[im 35/52]
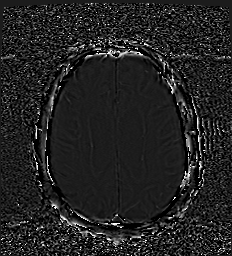
[im 52/52]
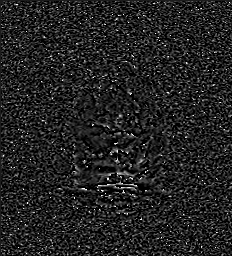

[Series 13: swi_images · axial · 3.0mm · 0.90mm/px · z∈[+2,+152]mm · 4 of 52 slices shown]
[im 1/52]
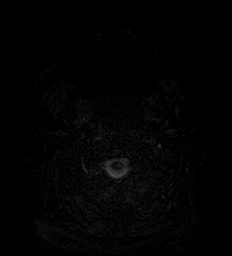
[im 18/52]
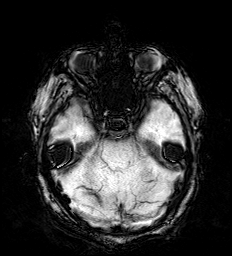
[im 35/52]
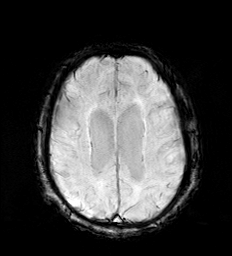
[im 52/52]
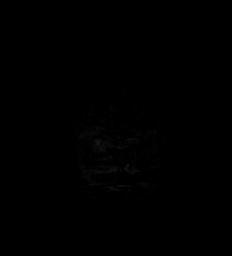

[Series 15: FLAIR · axial · 3.0mm · 0.69mm/px · z∈[-4,+153]mm · 4 of 54 slices shown]
[im 1/54]
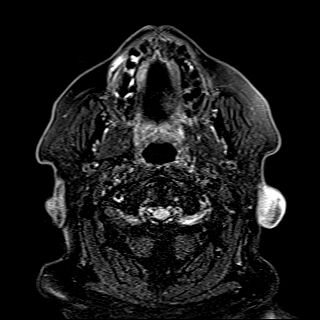
[im 18/54]
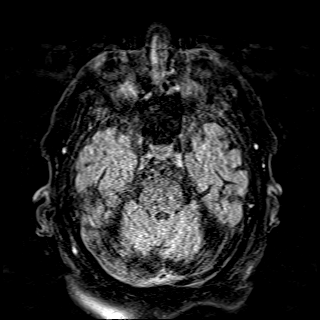
[im 36/54]
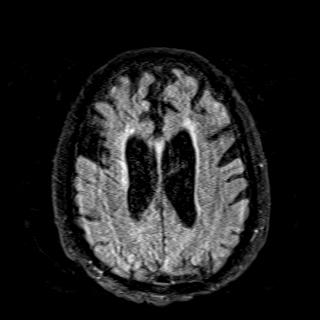
[im 54/54]
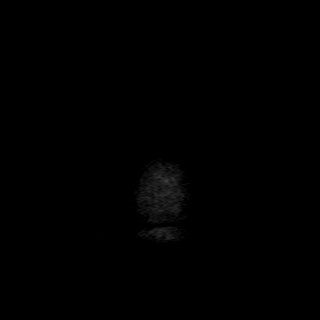

[Series 16: T1 · axial · 1.0mm · 0.98mm/px · z∈[-7,+165]mm · 13 of 174 slices shown (2 of 2)]
[im 1/174]
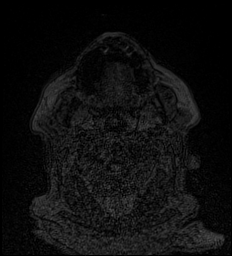
[im 15/174]
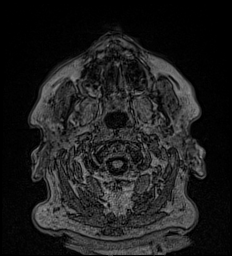
[im 29/174]
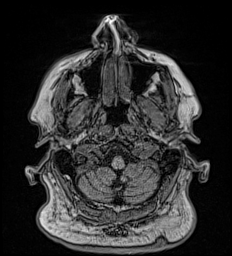
[im 44/174]
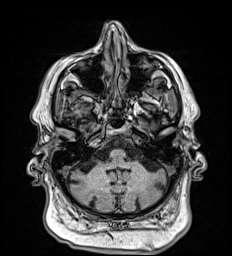
[im 58/174]
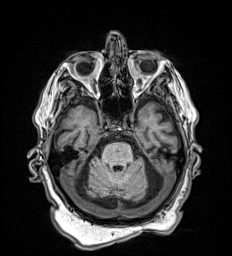
[im 73/174]
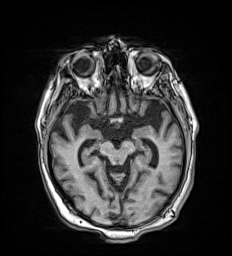
[im 87/174]
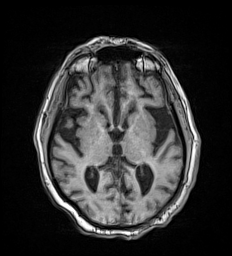
[im 101/174]
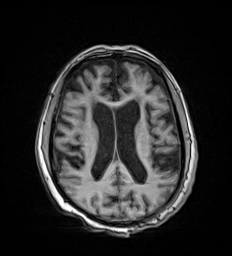
[im 116/174]
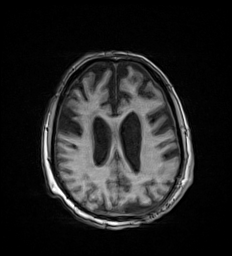
[im 130/174]
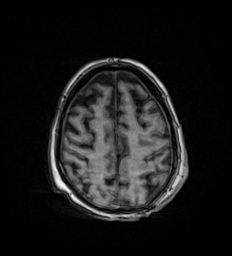
[im 145/174]
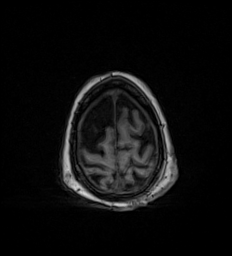
[im 159/174]
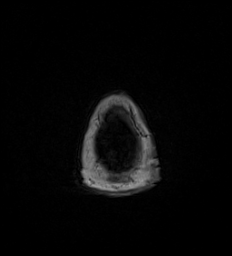
[im 174/174]
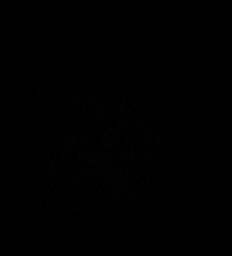

[Series 17: T2 · coronal · 5.0mm · 0.86mm/px · 2 of 29 slices shown (2 of 2)]
[im 1/29]
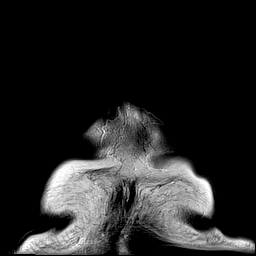
[im 29/29]
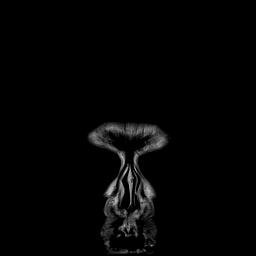

[48 of 48 positions shown; findings below may reference images not displayed]

FINDINGS: MR HEAD FINDINGS

Brain: There is no evidence of an acute infarct, intracranial
hemorrhage, mass, midline shift, or extra-axial fluid collection. T2
hyperintensities in the cerebral white matter bilaterally are
nonspecific but compatible with mild chronic small vessel ischemic
disease. There is moderate to severe cerebral atrophy.

Vascular: Major intracranial vascular flow voids are preserved.

Skull and upper cervical spine: Unremarkable bone marrow signal.

Sinuses/Orbits: Bilateral cataract extraction. Minimal mucosal
thickening in the paranasal sinuses. Small bilateral mastoid
effusions.

Other: None.

MRA HEAD FINDINGS

Anterior circulation: The internal carotid arteries are widely
patent from skull base to carotid termini. ACAs and MCAs are patent
without evidence of a proximal branch occlusion or significant
proximal stenosis. Motion artifact limits detailed assessment of the
A2 and A3 segments. No aneurysm is identified.

Posterior circulation: The included distal vertebral arteries are
patent to the basilar. The right PICA and left AICA appear dominant.
Patent SCA is are seen bilaterally. The basilar artery is widely
patent. Posterior communicating arteries are diminutive or absent.
Both PCAs are patent without evidence of a significant proximal
stenosis. No aneurysm is identified.

Anatomic variants:Dominant left A1 segment.

MRA NECK FINDINGS

Assessment is limited by suboptimal timing on the contrast-enhanced
MRA.

Aortic arch: Standard 3 vessel aortic arch. Grossly patent
brachiocephalic and subclavian arteries.

Right carotid system: Patent with approximately 55% stenosis of the
ICA near the distal aspect of the carotid bulb.

Left carotid system: Patent without evidence of a significant
stenosis or dissection.

Vertebral arteries: Antegrade flow bilaterally. Poor assessment of
the V1 segments. Widely patent V2 and V3 segments. Mildly dominant
right vertebral artery.
IMPRESSION: 1. No acute intracranial abnormality.
2. Mild chronic small vessel ischemic disease and moderate to severe
cerebral atrophy.
3. No large vessel occlusion or significant proximal intracranial
stenosis.
4. 55% stenosis of the proximal right ICA.

## 2022-03-17 IMAGING — MR MR MRA HEAD W/O CM
1 series · 25 of 48 positions shown · IV contrast (gadavist)
Comparison: Head CT [DATE]

CLINICAL DATA: Transient facial droop and aphasia.

EXAM:
MRI HEAD WITHOUT CONTRAST
MRA HEAD WITHOUT CONTRAST
MRA OF THE NECK WITHOUT AND WITH CONTRAST
TECHNIQUE: Multiplanar, multi-echo pulse sequences of the brain and surrounding
structures were acquired without intravenous contrast. Angiographic
images of the Circle of Willis were acquired using MRA technique
without intravenous contrast. Angiographic images of the neck were
acquired using MRA technique without and with intravenous contrast.
Carotid stenosis measurements (when applicable) are obtained
utilizing NASCET criteria, using the distal internal carotid
diameter as the denominator.
CONTRAST:  10mL GADAVIST GADOBUTROL 1 MMOL/ML IV SOLN

[Series 5: TOF · axial · 0.5mm · 0.48mm/px · z∈[+10,+102]mm · 25 of 217 slices shown]
[im 1/217]
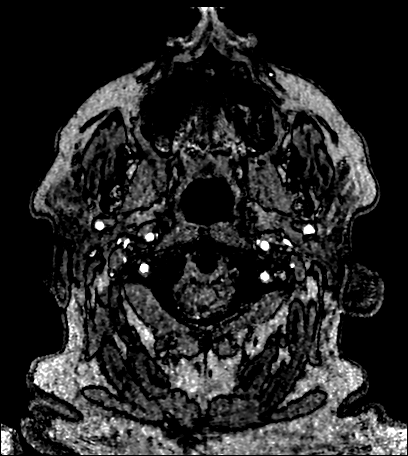
[im 5/217]
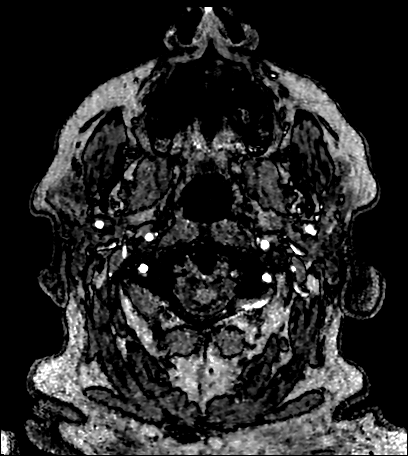
[im 10/217]
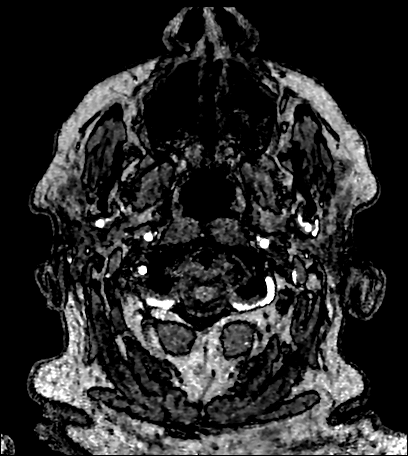
[im 14/217]
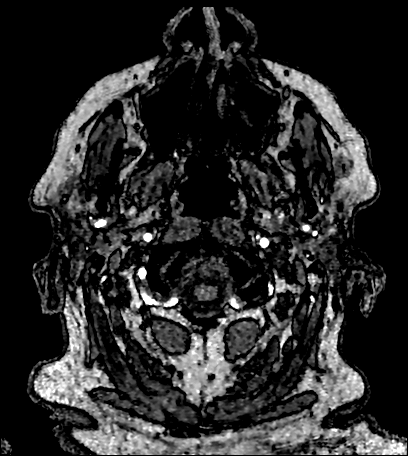
[im 19/217]
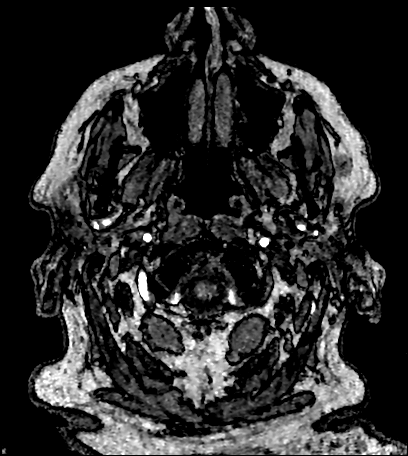
[im 23/217]
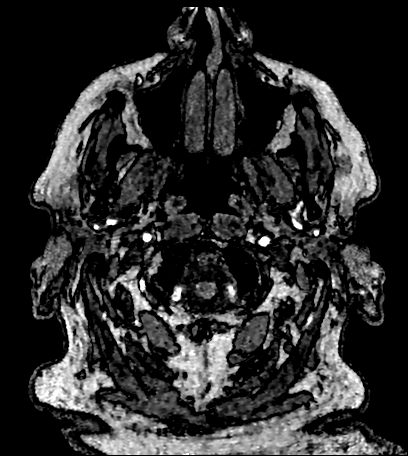
[im 28/217]
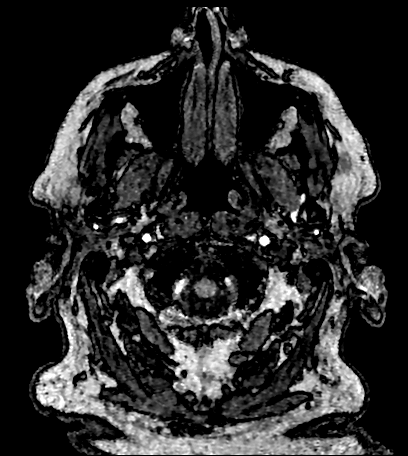
[im 33/217]
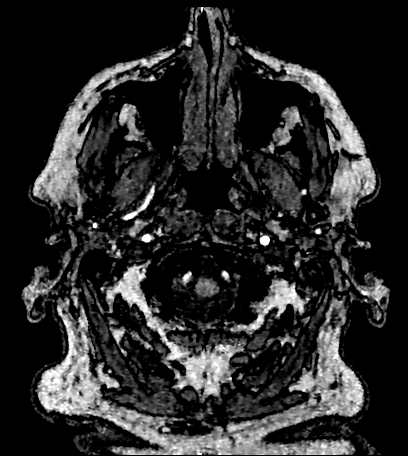
[im 37/217]
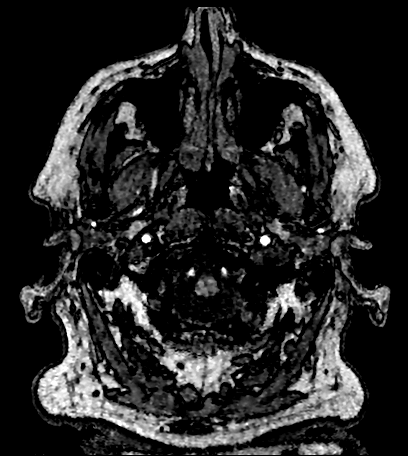
[im 42/217]
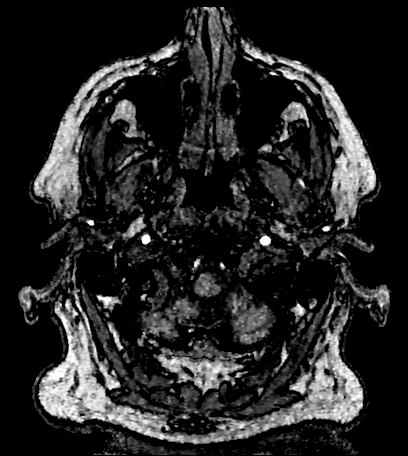
[im 46/217]
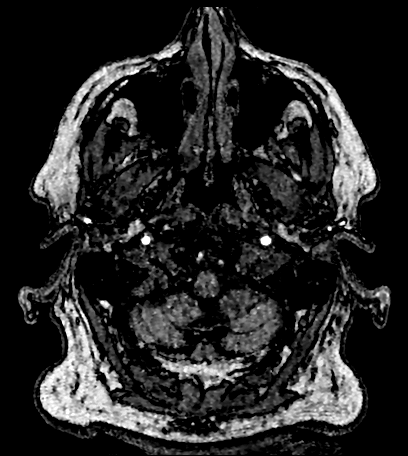
[im 51/217]
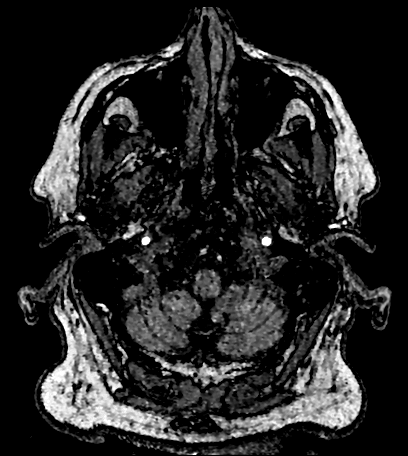
[im 56/217]
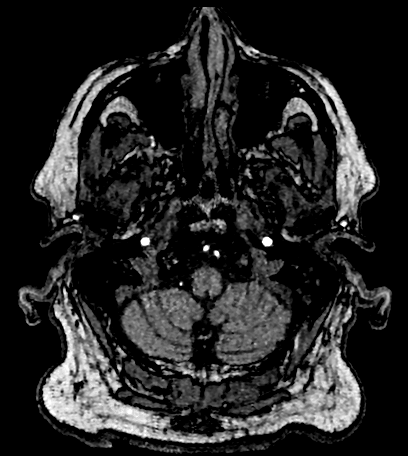
[im 60/217]
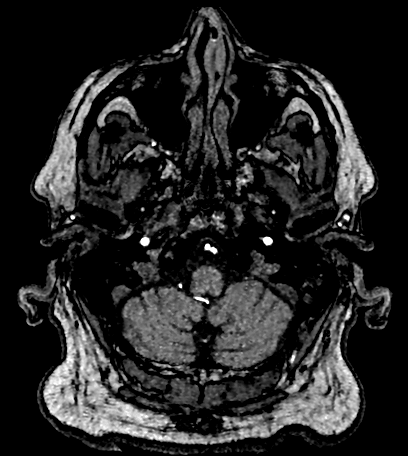
[im 65/217]
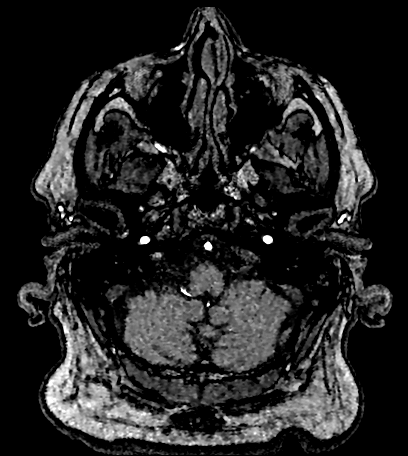
[im 69/217]
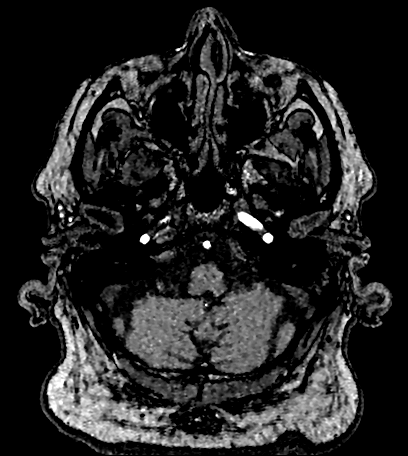
[im 74/217]
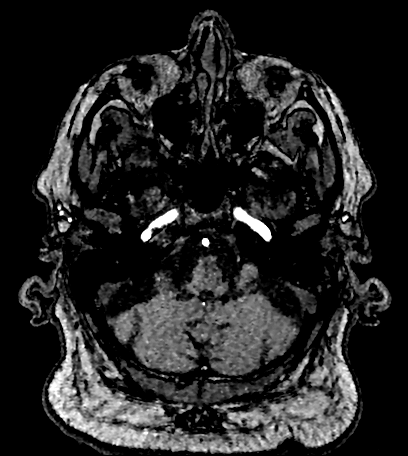
[im 79/217]
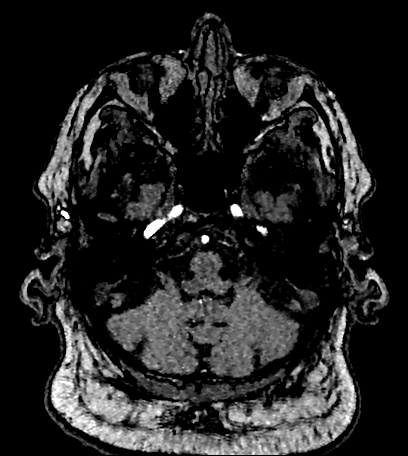
[im 97/217]
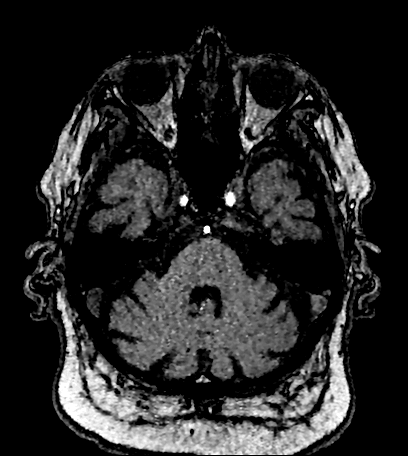
[im 111/217]
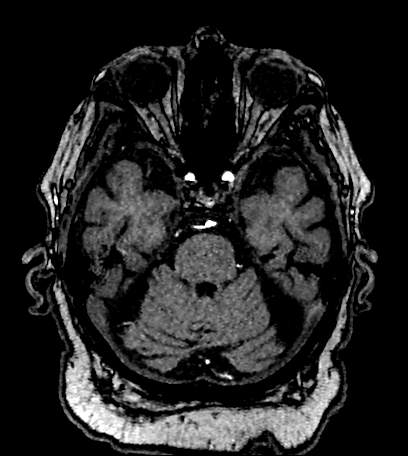
[im 125/217]
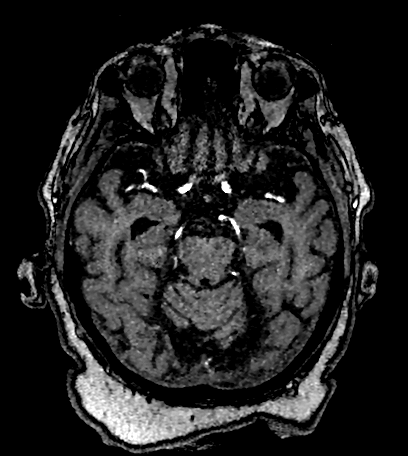
[im 152/217]
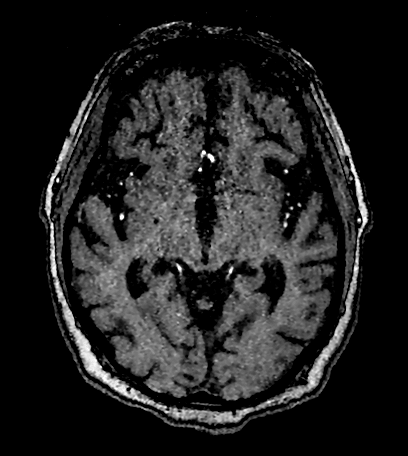
[im 180/217]
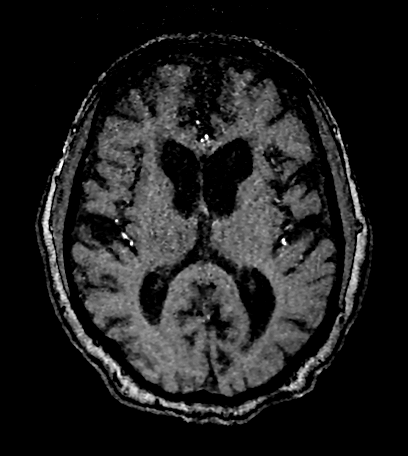
[im 184/217]
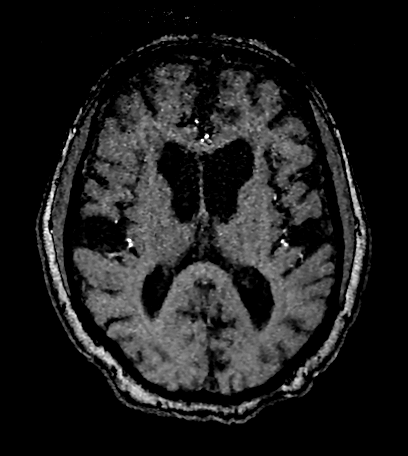
[im 207/217]
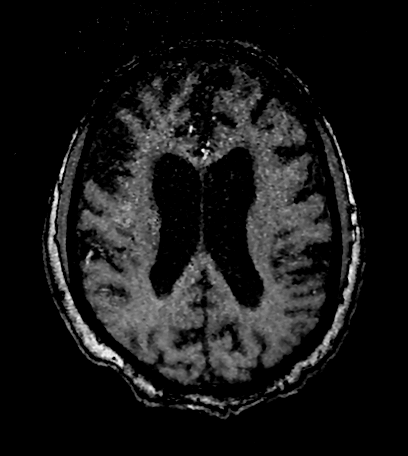

[25 of 48 positions shown; findings below may reference images not displayed]

FINDINGS: MR HEAD FINDINGS

Brain: There is no evidence of an acute infarct, intracranial
hemorrhage, mass, midline shift, or extra-axial fluid collection. T2
hyperintensities in the cerebral white matter bilaterally are
nonspecific but compatible with mild chronic small vessel ischemic
disease. There is moderate to severe cerebral atrophy.

Vascular: Major intracranial vascular flow voids are preserved.

Skull and upper cervical spine: Unremarkable bone marrow signal.

Sinuses/Orbits: Bilateral cataract extraction. Minimal mucosal
thickening in the paranasal sinuses. Small bilateral mastoid
effusions.

Other: None.

MRA HEAD FINDINGS

Anterior circulation: The internal carotid arteries are widely
patent from skull base to carotid termini. ACAs and MCAs are patent
without evidence of a proximal branch occlusion or significant
proximal stenosis. Motion artifact limits detailed assessment of the
A2 and A3 segments. No aneurysm is identified.

Posterior circulation: The included distal vertebral arteries are
patent to the basilar. The right PICA and left AICA appear dominant.
Patent SCA is are seen bilaterally. The basilar artery is widely
patent. Posterior communicating arteries are diminutive or absent.
Both PCAs are patent without evidence of a significant proximal
stenosis. No aneurysm is identified.

Anatomic variants:Dominant left A1 segment.

MRA NECK FINDINGS

Assessment is limited by suboptimal timing on the contrast-enhanced
MRA.

Aortic arch: Standard 3 vessel aortic arch. Grossly patent
brachiocephalic and subclavian arteries.

Right carotid system: Patent with approximately 55% stenosis of the
ICA near the distal aspect of the carotid bulb.

Left carotid system: Patent without evidence of a significant
stenosis or dissection.

Vertebral arteries: Antegrade flow bilaterally. Poor assessment of
the V1 segments. Widely patent V2 and V3 segments. Mildly dominant
right vertebral artery.
IMPRESSION: 1. No acute intracranial abnormality.
2. Mild chronic small vessel ischemic disease and moderate to severe
cerebral atrophy.
3. No large vessel occlusion or significant proximal intracranial
stenosis.
4. 55% stenosis of the proximal right ICA.

## 2022-03-17 IMAGING — MR MR MRA NECK WO/W CM
5 of 7 series · 29 of 48 positions shown · IV contrast (10ml Gadavist)
Comparison: Head CT [DATE]

CLINICAL DATA: Transient facial droop and aphasia.

EXAM:
MRI HEAD WITHOUT CONTRAST
MRA HEAD WITHOUT CONTRAST
MRA OF THE NECK WITHOUT AND WITH CONTRAST
TECHNIQUE: Multiplanar, multi-echo pulse sequences of the brain and surrounding
structures were acquired without intravenous contrast. Angiographic
images of the Circle of Willis were acquired using MRA technique
without intravenous contrast. Angiographic images of the neck were
acquired using MRA technique without and with intravenous contrast.
Carotid stenosis measurements (when applicable) are obtained
utilizing NASCET criteria, using the distal internal carotid
diameter as the denominator.
CONTRAST:  10mL GADAVIST GADOBUTROL 1 MMOL/ML IV SOLN

[Series 15: angio_fl3d_cor_post_ttc=2.0s · coronal · 0.9mm · 0.85mm/px · 12 of 96 slices shown]
[im 1/96]
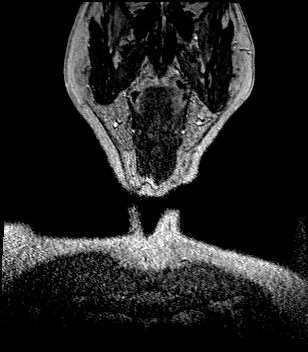
[im 9/96]
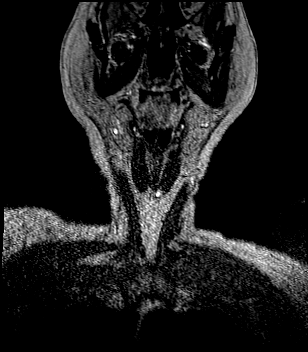
[im 18/96]
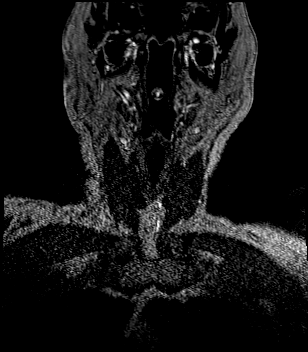
[im 26/96]
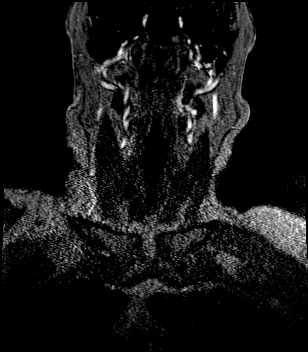
[im 35/96]
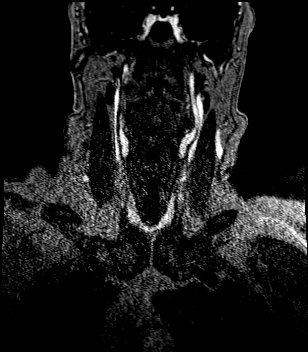
[im 44/96]
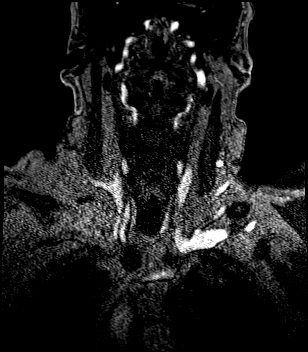
[im 52/96]
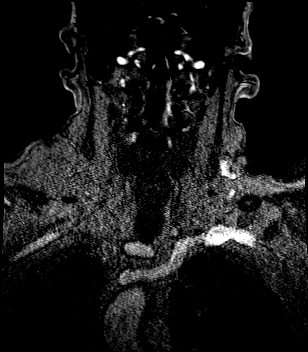
[im 61/96]
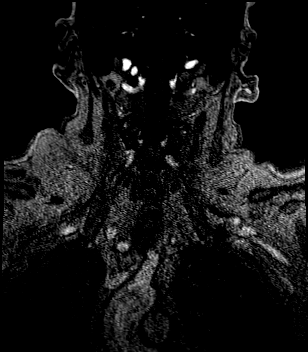
[im 70/96]
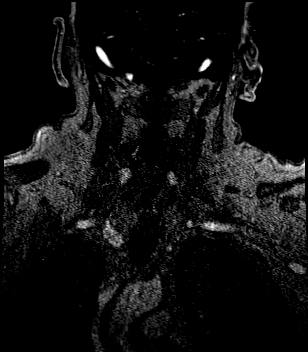
[im 78/96]
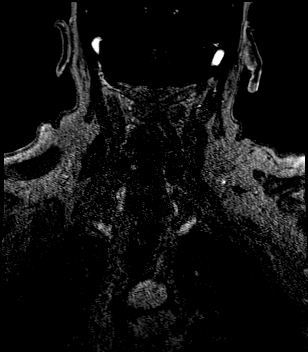
[im 87/96]
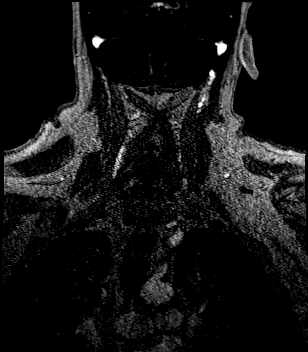
[im 96/96]
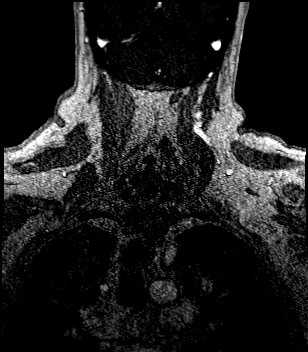

[Series 17: angio_fl3d_cor_post_ttc=2.0s_moco-adv_sub · coronal · 0.9mm · 0.85mm/px · 12 of 94 slices shown]
[im 1/94]
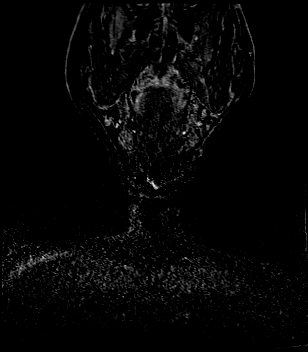
[im 9/94]
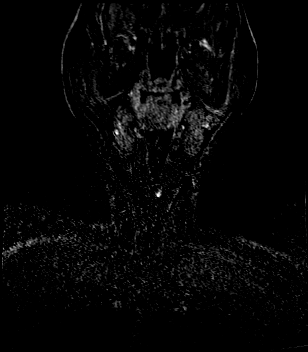
[im 17/94]
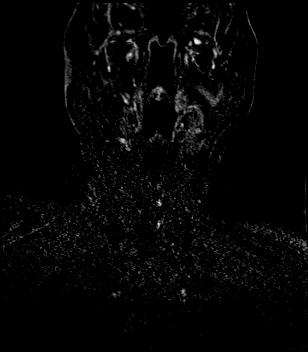
[im 26/94]
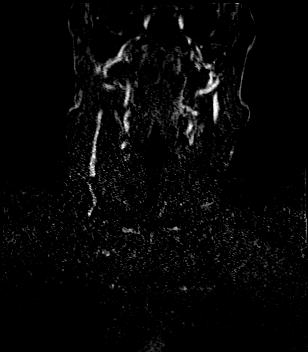
[im 34/94]
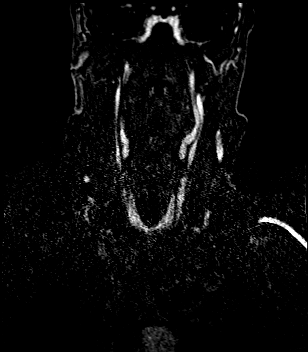
[im 43/94]
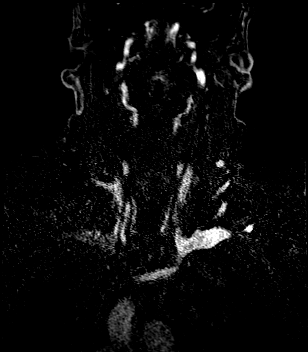
[im 51/94]
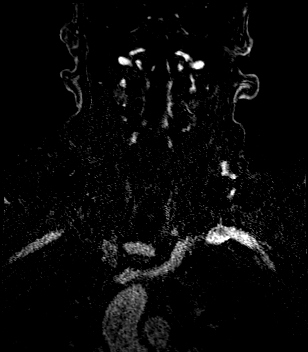
[im 60/94]
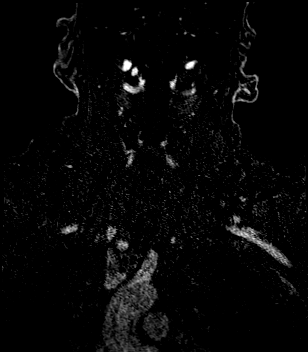
[im 68/94]
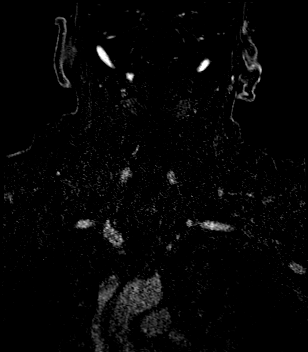
[im 77/94]
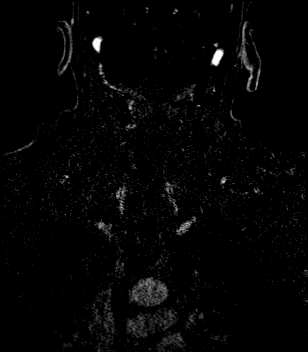
[im 85/94]
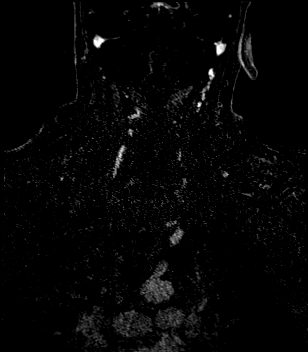
[im 94/94]
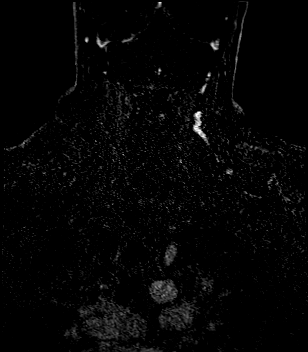

[Series 1057: neck with and · coronal · non-contrast · 0.9mm · 0.58mm/px · 1 of 9 slices shown]
[im 1/9]
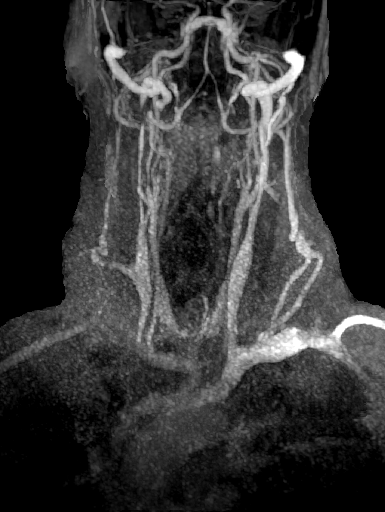

[Series 1063: verts · coronal · 0.9mm · 0.53mm/px · 2 of 17 slices shown]
[im 1/17]
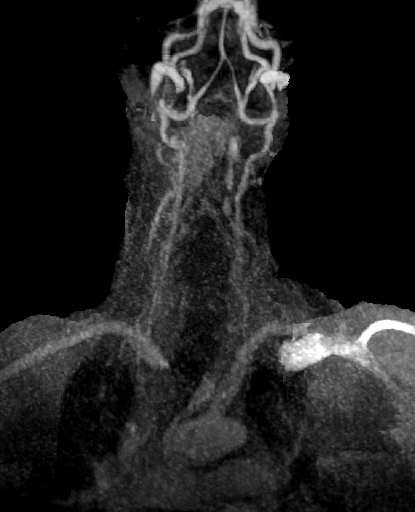
[im 17/17]
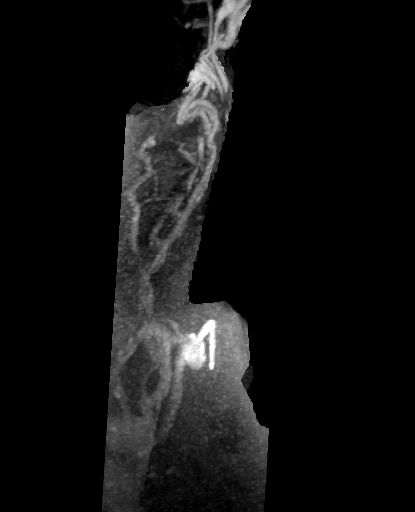

[Series 1069: rcca · coronal · 0.9mm · 0.50mm/px · 2 of 16 slices shown]
[im 1/16]
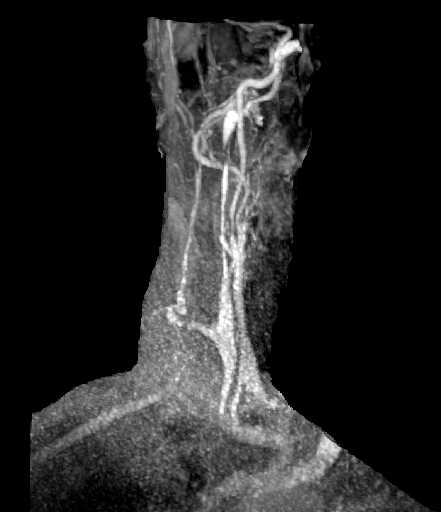
[im 16/16]
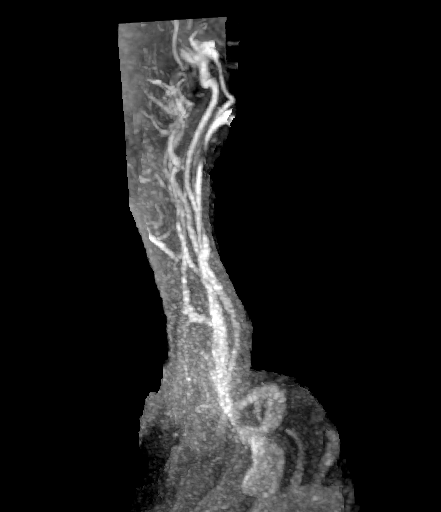

[29 of 48 positions shown; findings below may reference images not displayed]

FINDINGS: MR HEAD FINDINGS

Brain: There is no evidence of an acute infarct, intracranial
hemorrhage, mass, midline shift, or extra-axial fluid collection. T2
hyperintensities in the cerebral white matter bilaterally are
nonspecific but compatible with mild chronic small vessel ischemic
disease. There is moderate to severe cerebral atrophy.

Vascular: Major intracranial vascular flow voids are preserved.

Skull and upper cervical spine: Unremarkable bone marrow signal.

Sinuses/Orbits: Bilateral cataract extraction. Minimal mucosal
thickening in the paranasal sinuses. Small bilateral mastoid
effusions.

Other: None.

MRA HEAD FINDINGS

Anterior circulation: The internal carotid arteries are widely
patent from skull base to carotid termini. ACAs and MCAs are patent
without evidence of a proximal branch occlusion or significant
proximal stenosis. Motion artifact limits detailed assessment of the
A2 and A3 segments. No aneurysm is identified.

Posterior circulation: The included distal vertebral arteries are
patent to the basilar. The right PICA and left AICA appear dominant.
Patent SCA is are seen bilaterally. The basilar artery is widely
patent. Posterior communicating arteries are diminutive or absent.
Both PCAs are patent without evidence of a significant proximal
stenosis. No aneurysm is identified.

Anatomic variants:Dominant left A1 segment.

MRA NECK FINDINGS

Assessment is limited by suboptimal timing on the contrast-enhanced
MRA.

Aortic arch: Standard 3 vessel aortic arch. Grossly patent
brachiocephalic and subclavian arteries.

Right carotid system: Patent with approximately 55% stenosis of the
ICA near the distal aspect of the carotid bulb.

Left carotid system: Patent without evidence of a significant
stenosis or dissection.

Vertebral arteries: Antegrade flow bilaterally. Poor assessment of
the V1 segments. Widely patent V2 and V3 segments. Mildly dominant
right vertebral artery.
IMPRESSION: 1. No acute intracranial abnormality.
2. Mild chronic small vessel ischemic disease and moderate to severe
cerebral atrophy.
3. No large vessel occlusion or significant proximal intracranial
stenosis.
4. 55% stenosis of the proximal right ICA.

## 2022-03-17 MED ORDER — GADOBUTROL 1 MMOL/ML IV SOLN
10.0000 mL | Freq: Once | INTRAVENOUS | Status: AC | PRN
  Administered 2022-03-17: 10 mL via INTRAVENOUS

## 2022-03-17 MED ORDER — SODIUM CHLORIDE 0.9% FLUSH: 3.0000 mL | Freq: Once | INTRAVENOUS | Status: DC

## 2022-03-17 NOTE — ED Notes (Signed)
Called [facility] for transport back to [facility]

## 2022-03-17 NOTE — Discharge Instructions (Signed)
Return to the ER for new, worsening, or persistent speech problems, weakness, facial droop, or any other new or worsening symptoms that are concerning.  Follow-up with the regular doctor.

## 2022-03-17 NOTE — ED Notes (Signed)
Pt's partner's daughter Lesia Hausen [phone]

## 2022-03-17 NOTE — ED Provider Notes (Signed)
[facility] Provider Note    Event Date/Time   First MD Initiated Contact with Patient 03/17/22 1425     (approximate)   History   Code Stroke (EMS dispatched to SNF for stroke like symptoms; Per EMS, patient had RIGHT sided facial droop and aphasia (lasting approx. 45 minutes); Upon arrival here, patient is able to speak clearly)   HPI  Cory [name] is a 88 y.o. male who presents to the ED for evaluation of Code Stroke (EMS dispatched to SNF for stroke like symptoms; Per EMS, patient had RIGHT sided facial droop and aphasia (lasting approx. 45 minutes); Upon arrival here, patient is able to speak clearly)    I reviewed discharge summary from medical admission from September 2021.  Anticoagulated on Eliquis due to atrial fibrillation.  Patient presents to the ED from his SNF for evaluation of a 45-minute episode of aphasia and facial droop.  Improving by the time I see him in the ED.  Denies any falls or recent injuries.  Physical Exam   Triage Vital Signs: ED Triage Vitals [03/17/22 1430]  Enc Vitals Group     BP (!) 106/44     Pulse Rate (!) 54     Resp 19     Temp 98.2 F (36.8 C)     Temp Source Oral     SpO2 97 %     Weight      Height      Head Circumference      Peak Flow      Pain Score      Pain Loc      Pain Edu?      Excl. in GC?     Most recent vital signs: Vitals:   03/17/22 1430 03/17/22 1500  BP: (!) 106/44 132/68  Pulse: (!) 54   Resp: 19 17  Temp: 98.2 F (36.8 C)   SpO2: 97% 97%    General: Awake, no distress.  CV:  Good peripheral perfusion.  Resp:  Normal effort.  Abd:  No distention.  MSK:  No deformity noted.  Neuro:  Minimal facial asymmetry, otherwise cranial nerves intact and 5/5 strength and sensation all 4 extremities. Other:     ED Results / Procedures / Treatments   Labs (all labs ordered are listed, but only abnormal results are displayed) Labs Reviewed  PROTIME-INR - Abnormal; Notable for  the following components:      Result Value   Prothrombin Time 18.7 (*)    INR 1.6 (*)    All other components within normal limits  APTT - Abnormal; Notable for the following components:   aPTT 50 (*)    All other components within normal limits  DIFFERENTIAL - Abnormal; Notable for the following components:   Lymphs Abs 4.4 (*)    All other components within normal limits  COMPREHENSIVE METABOLIC PANEL - Abnormal; Notable for the following components:   Glucose, Bld 121 (*)    Albumin 3.0 (*)    All other components within normal limits  CBC  ETHANOL  I-STAT CREATININE, ED  CBG MONITORING, ED    EKG Poor quality EKG seems to demonstrate atrial fibrillation with a rate of 68 bpm.  Normal axis.  Left bundle morphology without STEMI by Sgarbossa criteria.  RADIOLOGY CT head interpreted by me without evidence of acute intracranial pathology  Official radiology report(s): CT HEAD CODE STROKE WO CONTRAST  Result Date: 03/17/2022 CLINICAL DATA:  Code stroke. Provided history: Neuro  deficit, acute, stroke suspected. Additional history provided: Right-sided facial droop/slurred speech. EXAM: CT HEAD WITHOUT CONTRAST TECHNIQUE: Contiguous axial images were obtained from the base of the skull through the vertex without intravenous contrast. RADIATION DOSE REDUCTION: This exam was performed according to the departmental dose-optimization program which includes automated exposure control, adjustment of the mA and/or kV according to patient size and/or use of iterative reconstruction technique. COMPARISON:  Head CT 05/31/2020. FINDINGS: Brain: Moderate to advanced cerebral atrophy. Mild ill-defined hypoattenuation within the periventricular cerebral white matter and within the pons, nonspecific but compatible with chronic small vessel ischemic disease. There is no acute intracranial hemorrhage. No demarcated cortical infarct. No extra-axial fluid collection. No evidence of an intracranial mass. No  midline shift. Vascular: No hyperdense vessel.  Atherosclerotic calcifications. Skull: No fracture or aggressive osseous lesion. Sinuses/Orbits: No mass or acute finding within the imaged orbits. Mild mucosal thickening within the left maxillary sinus. Mild mucosal thickening scattered within the bilateral ethmoid air cells. Other: Small left mastoid effusion. ASPECTS [facility] Stroke Program Early CT Score) - Ganglionic level infarction (caudate, lentiform nuclei, internal capsule, insula, M1-M3 cortex): 7 - Supraganglionic infarction (M4-M6 cortex): 3 Total score (0-10 with 10 being normal): 10 These results were communicated to Dr. Amada Jupiter at 2:33 pmon 6/19/2023by text page via the [facility] messaging system. IMPRESSION: 1. No evidence of acute intracranial abnormality. 2. Mild chronic small vessel ischemic changes within the cerebral white matter and pons. 3. Moderate to advanced cerebral atrophy. 4. Mild paranasal sinus disease, as described. 5. Small left mastoid effusion. Electronically Signed   By: Jackey Loge D.O.   On: 03/17/2022 14:35    PROCEDURES and INTERVENTIONS:  .1-3 Lead EKG Interpretation  Performed by: Delton Prairie, MD Authorized by: Delton Prairie, MD     Interpretation: normal     ECG rate:  64   ECG rate assessment: normal     Rhythm: atrial fibrillation     Ectopy: none     Conduction: normal   .Critical Care  Performed by: Delton Prairie, MD Authorized by: Delton Prairie, MD   Critical care provider statement:    Critical care time (minutes):  30   Critical care time was exclusive of:  Separately billable procedures and treating other patients   Critical care was necessary to treat or prevent imminent or life-threatening deterioration of the following conditions:  CNS failure or compromise   Critical care was time spent personally by me on the following activities:  Development of treatment plan with patient or surrogate, discussions with consultants, evaluation of patient's  response to treatment, examination of patient, ordering and review of laboratory studies, ordering and review of radiographic studies, ordering and performing treatments and interventions, pulse oximetry, re-evaluation of patient's condition and review of old charts   Medications  sodium chloride flush (NS) 0.9 % injection 3 mL (3 mLs Intravenous Not Given 03/17/22 1445)     IMPRESSION / MDM / ASSESSMENT AND PLAN / ED COURSE  I reviewed the triage vital signs and the nursing notes.  Differential diagnosis includes, but is not limited to, stroke, seizure, TIA, trauma, ICH  {Patient presents with symptoms of an acute illness or injury that is potentially life-threatening.  Anticoagulated 88 year old male presents to the ED with resolving aphasia, right-sided weakness and facial droop concerning for TIA.  CT without evidence of ICH or clear signs of stroke.  Normal CBC, and metabolic panel.  CT head is clear.  He is awaiting MRI/MRA and is signed out to oncoming  provider to follow-up on these studies.  Clinical Course as of 03/17/22 1603  Mon Mar 17, 2022  1427 I consult with neurology who recommends MRI/MRAs.  If these are negative then outpatient management would be reasonable and would not necessarily require a TIA observation admission [DS]    Clinical Course User Index [DS] Delton Prairie, MD     FINAL CLINICAL IMPRESSION(S) / ED DIAGNOSES   Final diagnoses:  Aphasia  Right sided weakness  TIA (transient ischemic attack)     Rx / DC Orders   ED Discharge Orders     None        Note:  This document was prepared using Dragon voice recognition software and may include unintentional dictation errors.   Delton Prairie, MD 03/17/22 [phone]

## 2022-03-17 NOTE — ED Provider Notes (Signed)
-----------------------------------------   8:43 PM on 03/17/2022 -----------------------------------------  I took over care on this patient from Dr. Katrinka Blazing.  The MRI and MRI are negative for acute findings.  The patient has a 55% stenosis of the right ICA but no evidence of acute stroke.  Neurology recommended earlier that if the MRI was negative for acute findings the patient could be discharged.  IMPRESSION:  1. No acute intracranial abnormality.  2. Mild chronic small vessel ischemic disease and moderate to severe  cerebral atrophy.  3. No large vessel occlusion or significant proximal intracranial  stenosis.  4. 55% stenosis of the proximal right ICA.      Electronically Signed    On reassessment, the patient appears comfortable and has stable vital signs.  He has had no recurrence of symptoms.  I counseled the family members on the results of the imaging.  He will follow-up with his regular doctor.  Return precautions given, and they expressed understanding.   Dionne Bucy, MD 03/17/22 2045

## 2022-03-17 NOTE — ED Notes (Signed)
Swallow test not performed at this time as pt is difficult to keep awake. Pt able to tell this nurse his name, unable to say year, location, or why is here. Pt grimaces ever time he is touched. Face is equal and pt will follow commands like stick out tongue.

## 2022-03-17 NOTE — Progress Notes (Signed)
EMS pre alert stroke activation at 1400. Bedside report Neurologist, Dr Ritta Slot, available in ED for patient arrival.

## 2022-04-08 NOTE — Consult Note (Addendum)
This note is late entry.   Patient was evaluated emergently as a code stroke.  Initially he had right-sided facial droop and aphasia.  By the time he arrived to the emergency department, the symptoms had cleared and he was asymptomatic. NIHSS was 2 due to baseline deficits.  With negative vascular imaging and MRI, in a patient who is already on anticoagulation, I discussed with the ED physician that I was not sure how much benefit he would get from admission and that he should continue to work on outpatient risk factor modification including lipid control with goal LDL less than 70, diabetes control with goal A1c less than 7, hypertension control with goal normotension.  I do not feel that there is any compelling data to suggest that changing from one anticoagulant to another in the setting of breakthrough events is indicated.  I also would not favor adding antiplatelet therapy to his anticoagulation at this time.  He was advised to follow-up with outpatient neurology.  Ritta Slot, MD [facility] [phone]  If 7pm- 7am, please page neurology on call as listed in AMION.

## 2022-11-26 ENCOUNTER — Encounter: Payer: Self-pay | Admitting: Emergency Medicine

## 2022-11-26 ENCOUNTER — Inpatient Hospital Stay
Admission: EM | Admit: 2022-11-26 | Discharge: 2022-11-28 | DRG: 871 | Disposition: A | Discharge status: Skilled Nursing Facility | Payer: Medicare Other | Source: Skilled Nursing Facility | Attending: Obstetrics and Gynecology | Admitting: Obstetrics and Gynecology

## 2022-11-26 ENCOUNTER — Emergency Department: Payer: Medicare Other

## 2022-11-26 ENCOUNTER — Other Ambulatory Visit: Payer: Self-pay

## 2022-11-26 DIAGNOSIS — E669 Obesity, unspecified: Secondary | ICD-10-CM | POA: Diagnosis present

## 2022-11-26 DIAGNOSIS — L89311 Pressure ulcer of right buttock, stage 1: Secondary | ICD-10-CM | POA: Diagnosis present

## 2022-11-26 DIAGNOSIS — R1310 Dysphagia, unspecified: Secondary | ICD-10-CM | POA: Diagnosis present

## 2022-11-26 DIAGNOSIS — Z1152 Encounter for screening for COVID-19: Secondary | ICD-10-CM

## 2022-11-26 DIAGNOSIS — G8929 Other chronic pain: Secondary | ICD-10-CM | POA: Diagnosis present

## 2022-11-26 DIAGNOSIS — R1110 Vomiting, unspecified: Secondary | ICD-10-CM

## 2022-11-26 DIAGNOSIS — F0390 Unspecified dementia without behavioral disturbance: Secondary | ICD-10-CM | POA: Insufficient documentation

## 2022-11-26 DIAGNOSIS — Z66 Do not resuscitate: Secondary | ICD-10-CM | POA: Diagnosis present

## 2022-11-26 DIAGNOSIS — R197 Diarrhea, unspecified: Secondary | ICD-10-CM

## 2022-11-26 DIAGNOSIS — A084 Viral intestinal infection, unspecified: Secondary | ICD-10-CM | POA: Diagnosis present

## 2022-11-26 DIAGNOSIS — K529 Noninfective gastroenteritis and colitis, unspecified: Secondary | ICD-10-CM | POA: Diagnosis not present

## 2022-11-26 DIAGNOSIS — Z794 Long term (current) use of insulin: Secondary | ICD-10-CM | POA: Diagnosis not present

## 2022-11-26 DIAGNOSIS — Z7901 Long term (current) use of anticoagulants: Secondary | ICD-10-CM

## 2022-11-26 DIAGNOSIS — N400 Enlarged prostate without lower urinary tract symptoms: Secondary | ICD-10-CM | POA: Diagnosis present

## 2022-11-26 DIAGNOSIS — Z7401 Bed confinement status: Secondary | ICD-10-CM

## 2022-11-26 DIAGNOSIS — I480 Paroxysmal atrial fibrillation: Secondary | ICD-10-CM | POA: Diagnosis present

## 2022-11-26 DIAGNOSIS — I4892 Unspecified atrial flutter: Secondary | ICD-10-CM | POA: Diagnosis present

## 2022-11-26 DIAGNOSIS — I4430 Unspecified atrioventricular block: Secondary | ICD-10-CM | POA: Diagnosis present

## 2022-11-26 DIAGNOSIS — E1169 Type 2 diabetes mellitus with other specified complication: Secondary | ICD-10-CM | POA: Diagnosis present

## 2022-11-26 DIAGNOSIS — I2699 Other pulmonary embolism without acute cor pulmonale: Secondary | ICD-10-CM | POA: Diagnosis present

## 2022-11-26 DIAGNOSIS — Z79899 Other long term (current) drug therapy: Secondary | ICD-10-CM

## 2022-11-26 DIAGNOSIS — E860 Dehydration: Secondary | ICD-10-CM | POA: Diagnosis present

## 2022-11-26 DIAGNOSIS — E039 Hypothyroidism, unspecified: Secondary | ICD-10-CM | POA: Diagnosis present

## 2022-11-26 DIAGNOSIS — E785 Hyperlipidemia, unspecified: Secondary | ICD-10-CM | POA: Diagnosis present

## 2022-11-26 DIAGNOSIS — E872 Acidosis, unspecified: Secondary | ICD-10-CM | POA: Insufficient documentation

## 2022-11-26 DIAGNOSIS — E119 Type 2 diabetes mellitus without complications: Secondary | ICD-10-CM

## 2022-11-26 DIAGNOSIS — Z6826 Body mass index (BMI) 26.0-26.9, adult: Secondary | ICD-10-CM

## 2022-11-26 DIAGNOSIS — G4733 Obstructive sleep apnea (adult) (pediatric): Secondary | ICD-10-CM | POA: Diagnosis present

## 2022-11-26 DIAGNOSIS — J690 Pneumonitis due to inhalation of food and vomit: Secondary | ICD-10-CM | POA: Diagnosis present

## 2022-11-26 DIAGNOSIS — R5383 Other fatigue: Secondary | ICD-10-CM | POA: Diagnosis present

## 2022-11-26 DIAGNOSIS — Z79890 Hormone replacement therapy (postmenopausal): Secondary | ICD-10-CM

## 2022-11-26 DIAGNOSIS — A419 Sepsis, unspecified organism: Secondary | ICD-10-CM | POA: Diagnosis not present

## 2022-11-26 DIAGNOSIS — Z86711 Personal history of pulmonary embolism: Secondary | ICD-10-CM | POA: Diagnosis not present

## 2022-11-26 DIAGNOSIS — A4189 Other specified sepsis: Principal | ICD-10-CM | POA: Diagnosis present

## 2022-11-26 DIAGNOSIS — L8990 Pressure ulcer of unspecified site, unspecified stage: Secondary | ICD-10-CM | POA: Insufficient documentation

## 2022-11-26 DIAGNOSIS — L02519 Cutaneous abscess of unspecified hand: Secondary | ICD-10-CM | POA: Diagnosis present

## 2022-11-26 LAB — COMPREHENSIVE METABOLIC PANEL
ALT: 16 U/L (ref 0–44)
ALT: 17 U/L (ref 0–44)
AST: 33 U/L (ref 15–41)
AST: 44 U/L — ABNORMAL HIGH (ref 15–41)
Albumin: 3.3 g/dL — ABNORMAL LOW (ref 3.5–5.0)
Albumin: 3 g/dL — ABNORMAL LOW (ref 3.5–5.0)
Alkaline Phosphatase: 75 U/L (ref 38–126)
Alkaline Phosphatase: 66 U/L (ref 38–126)
Anion gap: 10 (ref 5–15)
Anion gap: 11 (ref 5–15)
BUN: 16 mg/dL (ref 8–23)
BUN: 17 mg/dL (ref 8–23)
CO2: 26 mmol/L (ref 22–32)
CO2: 26 mmol/L (ref 22–32)
Calcium: 9.2 mg/dL (ref 8.9–10.3)
Calcium: 8.8 mg/dL — ABNORMAL LOW (ref 8.9–10.3)
Chloride: 103 mmol/L (ref 98–111)
Chloride: 99 mmol/L (ref 98–111)
Creatinine, Ser: 1.11 mg/dL (ref 0.61–1.24)
Creatinine, Ser: 1.11 mg/dL (ref 0.61–1.24)
GFR, Estimated: >60 mL/min (ref >60)
GFR, Estimated: >60 mL/min (ref >60)
Glucose, Bld: 120 mg/dL — ABNORMAL HIGH (ref 70–99)
Glucose, Bld: 168 mg/dL — ABNORMAL HIGH (ref 70–99)
Potassium: 4.4 mmol/L (ref 3.5–5.1)
Potassium: 5 mmol/L (ref 3.5–5.1)
Sodium: 139 mmol/L (ref 135–145)
Sodium: 136 mmol/L (ref 135–145)
Total Bilirubin: 0.7 mg/dL (ref 0.3–1.2)
Total Bilirubin: 0.7 mg/dL (ref 0.3–1.2)
Total Protein: 7.6 g/dL (ref 6.5–8.1)
Total Protein: 7.2 g/dL (ref 6.5–8.1)

## 2022-11-26 LAB — C DIFFICILE QUICK SCREEN W PCR REFLEX
C Diff antigen: NEGATIVE
C Diff interpretation: NOT DETECTED
C Diff toxin: NEGATIVE

## 2022-11-26 LAB — GASTROINTESTINAL PANEL BY PCR, STOOL (REPLACES STOOL CULTURE)

## 2022-11-26 LAB — URINALYSIS, W/ REFLEX TO CULTURE (INFECTION SUSPECTED)
Bacteria, UA: NONE SEEN
Bilirubin Urine: NEGATIVE
Glucose, UA: NEGATIVE mg/dL
Hgb urine dipstick: NEGATIVE
Ketones, ur: NEGATIVE mg/dL
Leukocytes,Ua: NEGATIVE
Nitrite: NEGATIVE
Protein, ur: NEGATIVE mg/dL
Specific Gravity, Urine: 1.018 (ref 1.005–1.030)
Squamous Epithelial / HPF: NONE SEEN /HPF (ref 0–5)
pH: 5 (ref 5.0–8.0)

## 2022-11-26 LAB — AMMONIA: Ammonia: 51 umol/L — ABNORMAL HIGH (ref 9–35)

## 2022-11-26 LAB — MRSA NEXT GEN BY PCR, NASAL: MRSA by PCR Next Gen: NOT DETECTED

## 2022-11-26 LAB — CBC WITH DIFFERENTIAL/PLATELET
Abs Immature Granulocytes: 0.04 10*3/uL (ref 0.00–0.07)
Basophils Absolute: 0 10*3/uL (ref 0.0–0.1)
Basophils Relative: 0 %
Eosinophils Absolute: 0.1 10*3/uL (ref 0.0–0.5)
Eosinophils Relative: 1 %
HCT: 55.2 % — ABNORMAL HIGH (ref 39.0–52.0)
Hemoglobin: 17.2 g/dL — ABNORMAL HIGH (ref 13.0–17.0)
Immature Granulocytes: 0 %
Lymphocytes Relative: 17 %
Lymphs Abs: 2.2 10*3/uL (ref 0.7–4.0)
MCH: 31.2 pg (ref 26.0–34.0)
MCHC: 31.2 g/dL (ref 30.0–36.0)
MCV: 100 fL (ref 80.0–100.0)
Monocytes Absolute: 0.3 10*3/uL (ref 0.1–1.0)
Monocytes Relative: 3 %
Neutro Abs: 10 10*3/uL — ABNORMAL HIGH (ref 1.7–7.7)
Neutrophils Relative %: 79 %
Platelets: 188 10*3/uL (ref 150–400)
RBC: 5.52 MIL/uL (ref 4.22–5.81)
RDW: 13.8 % (ref 11.5–15.5)
WBC: 12.7 10*3/uL — ABNORMAL HIGH (ref 4.0–10.5)
nRBC: 0 % (ref 0.0–0.2)

## 2022-11-26 LAB — BLOOD GAS, VENOUS
Acid-base deficit: 2.4 mmol/L — ABNORMAL HIGH (ref 0.0–2.0)
Bicarbonate: 25.6 mmol/L (ref 20.0–28.0)
O2 Saturation: 49.4 %
Patient temperature: 37
pCO2, Ven: 57 mmHg (ref 44–60)
pH, Ven: 7.26 (ref 7.25–7.43)
pO2, Ven: 36 mmHg (ref 32–45)

## 2022-11-26 LAB — RESP PANEL BY RT-PCR (RSV, FLU A&B, COVID)  RVPGX2
Influenza A by PCR: NEGATIVE
Influenza B by PCR: NEGATIVE
Resp Syncytial Virus by PCR: NEGATIVE
SARS Coronavirus 2 by RT PCR: NEGATIVE

## 2022-11-26 LAB — CBC
HCT: 47.1 % (ref 39.0–52.0)
Hemoglobin: 14.9 g/dL (ref 13.0–17.0)
MCH: 31.5 pg (ref 26.0–34.0)
MCHC: 31.6 g/dL (ref 30.0–36.0)
MCV: 99.6 fL (ref 80.0–100.0)
Platelets: 183 10*3/uL (ref 150–400)
RBC: 4.73 MIL/uL (ref 4.22–5.81)
RDW: 14.1 % (ref 11.5–15.5)
WBC: 20.8 10*3/uL — ABNORMAL HIGH (ref 4.0–10.5)
nRBC: 0 % (ref 0.0–0.2)

## 2022-11-26 LAB — LACTIC ACID, PLASMA
Lactic Acid, Venous: 3.4 mmol/L (ref 0.5–1.9)
Lactic Acid, Venous: 5.4 mmol/L (ref 0.5–1.9)
Lactic Acid, Venous: 5.4 mmol/L (ref 0.5–1.9)
Lactic Acid, Venous: 5.8 mmol/L (ref 0.5–1.9)

## 2022-11-26 LAB — CREATININE, SERUM
Creatinine, Ser: 1.06 mg/dL (ref 0.61–1.24)
GFR, Estimated: >60 mL/min (ref >60)

## 2022-11-26 LAB — GLUCOSE, CAPILLARY
Glucose-Capillary: 128 mg/dL — ABNORMAL HIGH (ref 70–99)
Glucose-Capillary: 144 mg/dL — ABNORMAL HIGH (ref 70–99)
Glucose-Capillary: 178 mg/dL — ABNORMAL HIGH (ref 70–99)

## 2022-11-26 LAB — LIPASE, BLOOD: Lipase: 23 U/L (ref 11–51)

## 2022-11-26 MED ORDER — LACTATED RINGERS IV BOLUS (SEPSIS)
1000.0000 mL | Freq: Once | INTRAVENOUS | Status: AC
  Administered 2022-11-26: 1000 mL via INTRAVENOUS

## 2022-11-26 MED ORDER — ONDANSETRON HCL 4 MG/2ML IJ SOLN
4.0000 mg | Freq: Once | INTRAMUSCULAR | Status: AC
  Administered 2022-11-26: 4 mg via INTRAVENOUS
  Filled 2022-11-26: qty 2

## 2022-11-26 MED ORDER — SODIUM CHLORIDE 0.9 % IV SOLN
2.0000 g | Freq: Two times a day (BID) | INTRAVENOUS | Status: DC
  Administered 2022-11-26 (×2): 2 g via INTRAVENOUS
  Filled 2022-11-26 (×2): qty 2
  Filled 2022-11-26: qty 12.5

## 2022-11-26 MED ORDER — SODIUM CHLORIDE 0.9 % IV SOLN: INTRAVENOUS | Status: DC | PRN

## 2022-11-26 MED ORDER — LACTATED RINGERS IV SOLN: INTRAVENOUS | Status: DC

## 2022-11-26 MED ORDER — ACETAMINOPHEN 325 MG RE SUPP
650.0000 mg | Freq: Once | RECTAL | Status: AC
  Administered 2022-11-26: 650 mg via RECTAL
  Filled 2022-11-26: qty 2

## 2022-11-26 MED ORDER — LACTATED RINGERS IV SOLN
150.0000 mL/h | INTRAVENOUS | Status: AC
  Administered 2022-11-26 (×3): 150 mL/h via INTRAVENOUS

## 2022-11-26 MED ORDER — ACETAMINOPHEN 500 MG PO TABS
1000.0000 mg | ORAL_TABLET | Freq: Once | ORAL | Status: DC
  Filled 2022-11-26: qty 2

## 2022-11-26 MED ORDER — LACTATED RINGERS IV BOLUS (SEPSIS)
500.0000 mL | Freq: Once | INTRAVENOUS | Status: AC
  Administered 2022-11-26: 500 mL via INTRAVENOUS

## 2022-11-26 MED ORDER — ACETAMINOPHEN 325 MG PO TABS: 650.0000 mg | ORAL_TABLET | Freq: Four times a day (QID) | ORAL | Status: DC | PRN

## 2022-11-26 MED ORDER — SODIUM CHLORIDE 0.9 % IV SOLN
2.0000 g | Freq: Once | INTRAVENOUS | Status: AC
  Administered 2022-11-26: 2 g via INTRAVENOUS
  Filled 2022-11-26: qty 12.5

## 2022-11-26 MED ORDER — IOHEXOL 300 MG/ML  SOLN
100.0000 mL | Freq: Once | INTRAMUSCULAR | Status: AC | PRN
  Administered 2022-11-26: 100 mL via INTRAVENOUS

## 2022-11-26 MED ORDER — SODIUM CHLORIDE 0.9 % IV SOLN: 2.0000 g | Freq: Once | INTRAVENOUS | Status: DC

## 2022-11-26 MED ORDER — VANCOMYCIN HCL 1250 MG/250ML IV SOLN
1250.0000 mg | Freq: Once | INTRAVENOUS | Status: AC
  Administered 2022-11-26: 1250 mg via INTRAVENOUS
  Filled 2022-11-26 (×2): qty 250

## 2022-11-26 MED ORDER — METRONIDAZOLE 500 MG/100ML IV SOLN
500.0000 mg | Freq: Two times a day (BID) | INTRAVENOUS | Status: DC
  Administered 2022-11-26 – 2022-11-27 (×2): 500 mg via INTRAVENOUS
  Filled 2022-11-26 (×2): qty 100

## 2022-11-26 MED ORDER — ONDANSETRON HCL 4 MG/2ML IJ SOLN: 4.0000 mg | Freq: Four times a day (QID) | INTRAMUSCULAR | Status: DC | PRN

## 2022-11-26 MED ORDER — SODIUM CHLORIDE 0.9 % IV SOLN: INTRAVENOUS | Status: DC

## 2022-11-26 MED ORDER — VANCOMYCIN HCL IN DEXTROSE 1-5 GM/200ML-% IV SOLN
1000.0000 mg | Freq: Once | INTRAVENOUS | Status: DC
  Filled 2022-11-26: qty 200

## 2022-11-26 MED ORDER — ONDANSETRON HCL 4 MG PO TABS: 4.0000 mg | ORAL_TABLET | Freq: Four times a day (QID) | ORAL | Status: DC | PRN

## 2022-11-26 MED ORDER — VANCOMYCIN HCL IN DEXTROSE 1-5 GM/200ML-% IV SOLN
1000.0000 mg | INTRAVENOUS | Status: DC
  Filled 2022-11-26: qty 200

## 2022-11-26 MED ORDER — METRONIDAZOLE 500 MG/100ML IV SOLN
500.0000 mg | Freq: Once | INTRAVENOUS | Status: AC
  Administered 2022-11-26: 500 mg via INTRAVENOUS
  Filled 2022-11-26: qty 100

## 2022-11-26 MED ORDER — ENOXAPARIN SODIUM 40 MG/0.4ML IJ SOSY
40.0000 mg | PREFILLED_SYRINGE | INTRAMUSCULAR | Status: DC
  Administered 2022-11-26: 40 mg via SUBCUTANEOUS
  Filled 2022-11-26: qty 0.4

## 2022-11-26 NOTE — ED Notes (Signed)
2nd set of blood cultures and Green top sent to lab.

## 2022-11-26 NOTE — ED Notes (Signed)
ED Provider at bedside. 

## 2022-11-26 NOTE — Assessment & Plan Note (Signed)
Some concern for aspiration pneumonia on imaging No hypoxia present Noted concomitant sepsis On cefepime, flagyl, vancomycin for infectious coverage in the setting of sepsis with GI source.  Adjust antibiotics as clinically appropriate.

## 2022-11-26 NOTE — ED Provider Notes (Addendum)
[facility] Provider Note    Event Date/Time   First MD Initiated Contact with Patient 11/26/22 0150     (approximate)   History   Emesis   HPI Level 5 caveat:  history/ROS limited by acute/critical illness  Cory [name] is a 89 y.o. male with a history that includes prior episodes of acute kidney failure, generalized weakness, dysphagia, HHS, hyponatremia, intractable pain, and pulmonary embolism.  His baseline mental status is unclear but he arrives with a MOST form indicating medical treatment but DNR.  He arrest by EMS from his rehab facility for complaints of vomiting and diarrhea.  His wife reportedly has a "stomach flu" and the patient has been exposed for several days.  Initially he was unresponsive to all but painful stimuli.  He woke up a little bit when I spoke with him and he was able to answer questions and open his eyes and look at me.  He indicates that he has abdominal pain but does not indicate any other pain and denies shortness of breath.     Physical Exam   Triage Vital Signs: ED Triage Vitals  Enc Vitals Group     BP 11/26/22 0119 (!) 158/77     Pulse Rate 11/26/22 0119 78     Resp 11/26/22 0119 (!) 28     Temp 11/26/22 0119 (!) 101.4 F (38.6 C)     Temp Source 11/26/22 0119 Rectal     SpO2 11/26/22 0114 95 %     Weight 11/26/22 0118 77.7 kg (171 lb 4.8 oz)     Height 11/26/22 0117 1.727 m ('5\' 8"'$ )     Head Circumference --      Peak Flow --      Pain Score --      Pain Loc --      Pain Edu? --      Excl. in [place]? --     Most recent vital signs: Vitals:   11/26/22 0700 11/26/22 0750  BP: (!) 116/58   Pulse: 73   Resp: (!) 24   Temp:  98.6 F (37 C)  SpO2: 92%      General: Minimally responsive although he will wake up to loud voice and oriented briefly and answer simple questions. CV:  Good peripheral perfusion.  Regular rate and rhythm, normal heart sounds. Resp:  Normal effort. Speaking easily and comfortably,  no accessory muscle usage nor intercostal retractions.  Lungs are clear to auscultation bilaterally. Abd:  No distention.  Patient grimaces to palpation throughout the abdomen with no localized tenderness and no specific peritonitis. Other:  Minimally responsive and ill-appearing.  Unsure of baseline.   ED Results / Procedures / Treatments   Labs (all labs ordered are listed, but only abnormal results are displayed) Labs Reviewed  LACTIC ACID, PLASMA - Abnormal; Notable for the following components:      Result Value   Lactic Acid, Venous 3.4 (*)    All other components within normal limits  LACTIC ACID, PLASMA - Abnormal; Notable for the following components:   Lactic Acid, Venous 5.4 (*)    All other components within normal limits  CBC WITH DIFFERENTIAL/PLATELET - Abnormal; Notable for the following components:   WBC 12.7 (*)    Hemoglobin 17.2 (*)    HCT 55.2 (*)    Neutro Abs 10.0 (*)    All other components within normal limits  URINALYSIS, W/ REFLEX TO CULTURE (INFECTION SUSPECTED) - Abnormal; Notable for the  following components:   Color, Urine YELLOW (*)    APPearance CLEAR (*)    All other components within normal limits  COMPREHENSIVE METABOLIC PANEL - Abnormal; Notable for the following components:   Glucose, Bld 120 (*)    Albumin 3.3 (*)    All other components within normal limits  RESP PANEL BY RT-PCR (RSV, FLU A&B, COVID)  RVPGX2  C DIFFICILE QUICK SCREEN W PCR REFLEX    GASTROINTESTINAL PANEL BY PCR, STOOL (REPLACES STOOL CULTURE)  CULTURE, BLOOD (ROUTINE X 2)  CULTURE, BLOOD (ROUTINE X 2)  LIPASE, BLOOD     EKG  ED ECG REPORT I, Hinda Kehr, the attending physician, personally viewed and interpreted this ECG.  Date: 11/26/2022 EKG Time: 1:42 AM Rate: 66 Rhythm: Atrial flutter with 41 AV block QRS Axis: normal Intervals: normal ST/T Wave abnormalities: Non-specific ST segment / T-wave changes, but no clear evidence of acute ischemia. Narrative  Interpretation: no definitive evidence of acute ischemia; does not meet STEMI criteria.    RADIOLOGY I viewed and interpreted the patient's 1 view chest x-ray I see no evidence of pneumonia.  I also read the radiologist's report, which confirmed no acute findings.  I also viewed and interpreted the patient's CT chest/abdomen/pelvis.  See hospital course for details: Possible aspiration pneumonia, no acute injury or abdominal or pelvic abnormality    PROCEDURES:  Critical Care performed: Yes, see critical care procedure note(s)  .1-3 Lead EKG Interpretation  Performed by: Hinda Kehr, MD Authorized by: Hinda Kehr, MD     Interpretation: normal     ECG rate:  80   ECG rate assessment: normal     Rhythm: sinus rhythm     Ectopy: none     Conduction: normal   .Critical Care  Performed by: Hinda Kehr, MD Authorized by: Hinda Kehr, MD   Critical care provider statement:    Critical care time (minutes):  45   Critical care time was exclusive of:  Separately billable procedures and treating other patients   Critical care was necessary to treat or prevent imminent or life-threatening deterioration of the following conditions:  Sepsis   Critical care was time spent personally by me on the following activities:  Development of treatment plan with patient or surrogate, evaluation of patient's response to treatment, examination of patient, obtaining history from patient or surrogate, ordering and performing treatments and interventions, ordering and review of laboratory studies, ordering and review of radiographic studies, pulse oximetry, re-evaluation of patient's condition and review of old charts    MEDICATIONS ORDERED IN ED: Medications  ondansetron (ZOFRAN) injection 4 mg (4 mg Intravenous Given 11/26/22 0144)  lactated ringers bolus 1,000 mL (0 mLs Intravenous Stopped 11/26/22 0235)  acetaminophen (TYLENOL) suppository 650 mg (650 mg Rectal Given 11/26/22 0159)  lactated  ringers bolus 1,000 mL (0 mLs Intravenous Stopped 11/26/22 0312)    And  lactated ringers bolus 500 mL (0 mLs Intravenous Stopped 11/26/22 0449)  ceFEPIme (MAXIPIME) 2 g in sodium chloride 0.9 % 100 mL IVPB (0 g Intravenous Stopped 11/26/22 0304)  metroNIDAZOLE (FLAGYL) IVPB 500 mg (0 mg Intravenous Stopped 11/26/22 0450)  iohexol (OMNIPAQUE) 300 MG/ML solution 100 mL (100 mLs Intravenous Contrast Given 11/26/22 0413)     IMPRESSION / MDM / [place] / ED COURSE  I reviewed the triage vital signs and the nursing notes.  Differential diagnosis includes, but is not limited to, sepsis, pneumonia, urinary tract infection, intra-abdominal infection, nonspecific viral gastroenteritis, C. difficile, electrolyte or metabolic abnormality, renal failure, much less likely acute intracranial hemorrhage or CVA.  Patient's presentation is most consistent with acute presentation with potential threat to life or bodily function.  Labs/studies ordered: "Possible sepsis workup": Respiratory viral panel PCR swab, blood cultures x2, pro time-INR, CMP, urinalysis, urine culture, lactic acid, APTT, CBC with differential, high-sensitivity troponin, lipase.  Also ordered GI pathogen panel and C. difficile PCR. Interventions/Medications given: LR 30 mL/kg IV bolus, cefepime 2 g IV, metronidazole 500 mg IV [facility] Course my include additional interventions not listed in this section:)  Patient presents with a rectal temperature of over 101 and minimally responsive.  I reviewed and will adhere to his MOST form and treat aggressively but without intubation or chest compressions if he were to experience a cardiac arrest.  The patient is on the cardiac monitor to evaluate for evidence of arrhythmia and/or significant heart rate changes.   Clinical Course as of 12/11/22 1541  Wed Nov 26, 2022  0200 DG Chest Portable 1 View I viewed and interpreted the patient's 1 view chest x-ray.   No obvious sign of pneumonia.  I also read the radiologist's report, which confirmed no acute findings. [CF]  8182 Completed sepsis reassessment [CF]  0404 C Difficile Quick Screen w PCR reflex Negative C. difficile [CF]  9937 (delayed documentation) patient met sepsis criteria with a leukocytosis of 12.7 and fever.  He also has a lactic acid of 3.4 meeting criteria for severe sepsis.  I ordered cefepime 2 g IV and metronidazole 500 mg IV as well as LR at 30 mL/kg IV fluids.  GI pathogen panel is negative and his urinalysis is negative.  I ordered a CT of the chest/abdomen/pelvis to try to find a specific source of infection.  Regardless he will need to be admitted. [CF]  0446 CT CHEST ABDOMEN PELVIS W CONTRAST As document above, CT chest/abdomen/pelvis reveals possible aspiration pneumonia but not a specific or clear diagnosis.  No explanation for his diarrhea.  I am consulting the hospitalist for admission. [CF]  1696 Consulted with Dr. Sidney Ace with the hospitalist service who will admit the patient [CF]    Clinical Course User Index [CF] Hinda Kehr, MD     FINAL CLINICAL IMPRESSION(S) / ED DIAGNOSES   Final diagnoses:  Severe sepsis ([place])  Vomiting and diarrhea  Aspiration pneumonia, unspecified aspiration pneumonia type, unspecified laterality, unspecified part of lung ([place])     Rx / DC Orders   ED Discharge Orders     None        Note:  This document was prepared using Dragon voice recognition software and may include unintentional dictation errors.   Hinda Kehr, MD 11/26/22 7893    Hinda Kehr, MD 12/11/22 [phone]

## 2022-11-26 NOTE — ED Notes (Signed)
Report given to Emily RN

## 2022-11-26 NOTE — ED Notes (Addendum)
Updated Alvino Blood, daughter, on patient status.

## 2022-11-26 NOTE — Sepsis Progress Note (Signed)
Following for sepsis monitoring ?

## 2022-11-26 NOTE — ED Notes (Signed)
Patient resting with eyes closed. Respirations even and unlabored. NAD noted.

## 2022-11-26 NOTE — ED Notes (Signed)
Patient transported back from CT 

## 2022-11-26 NOTE — Assessment & Plan Note (Signed)
Lactate 3.4-5.4 in the setting of active sepsis with associated GI source and?  Aspiration pneumonia Status post 2 to 3 L LR bolus in ER Will continue with LR maintenance IV fluids for now Follow.

## 2022-11-26 NOTE — ED Notes (Signed)
Unable to give vanc at this time due to it being tubed to 1C.

## 2022-11-26 NOTE — Assessment & Plan Note (Signed)
Baseline lethargy on presentation with unclear baseline Does respond to sternal rubs and does answer some questions albeit minimally Suspect likely multifactorial with overlapping sepsis Will monitor for now Consider imaging and further evaluation if symptoms persist or there is a significant deviation from baseline.

## 2022-11-26 NOTE — Assessment & Plan Note (Signed)
Meets sepsis criteria based on T 101.4, WBC 12  Some concern for GI source given vomiting, diarrhea  Also w/ ? Aspiration PNA on imaging  IV vancomycin, cefepime, Flagyl for infectious coverage COVID, C. difficile and GI panel negative at present Panculture Lactate 3.4-5.4 Status post LR bolus in the ER Continue with LR maintenance IV fluids Follow

## 2022-11-26 NOTE — Evaluation (Signed)
Clinical/Bedside Swallow Evaluation Patient Details  Name: Cory [name] MRN: [id number] Date of Birth: [birth year]  Today's Date: 11/26/2022 Time: SLP Start Time (ACUTE ONLY): 1635 SLP Stop Time (ACUTE ONLY): 1720 SLP Time Calculation (min) (ACUTE ONLY): 45 min  Past Medical History:  Past Medical History:  Diagnosis Date   Diabetes mellitus without complication (HCC)    OSA (obstructive sleep apnea)    Thyroid disease    Past Surgical History: History reviewed. No pertinent surgical history. HPI:  Pt is a 89 y.o. male with medical history significant of type 2 diabetes, sleep apnea, presented with sepsis, vomiting and diarrhea w/ ? aspiration pneumonia, lethargy. Per report, patient arrived from rehab facility with persistent vomiting and diarrhea.  No reports of black/bloody or green emesis or diarrhea.  No reported fevers or chills.  Was unresponsive but is more alert and responsive at the time of this evaluation.  Per MD note/report, patient with generalized abdominal pain.  No reports of shortness of breath. Positive sick contacts and wife with similar GI symptoms for multiple days per chart note.    Assessment / Plan / Recommendation  Clinical Impression   Pt seen for BSE today. Pt awake, verbally responded to basic questions. Some Confusion noted in responses/orientation. Pt required full feeding assistance d/t UE weakness baseline and verbal cues for follow through w/ po tasks. On RA, afebrile. Wbc and lactic acid elevated.   Pt appears to present w/ Functional oropharyngeal phase swallowing w/ a modified diet/liquids in diet in light of declined Cognitive status and acute illness, including Esophageal deficits(N/V). ANY Cognitive decline/confusion can impact overall awareness/timing of swallow and safety during po tasks which increases risk for aspiration, choking. W/ any Esophageal phase Dysmotility, there is risk for aspiration of REFLUX material/Regurgitation. Esophageal phase  Dysmotility can impact the pharyngeal phase of swallowing.  In setting of current illness/weakness, pt's risk for aspiration can be reduced when following general aspiration precautions and using a modified diet consistency w/ Nectar liquids.  Pt also requires verbal cues and monitoring during po tasks for follow through w/ precautions and reduce impulsive drinking/eating.   Pt consumed trials of ice chips, purees, soft solids and Nectar liquids w/ NO overt clinical s/s of aspiration noted: no decline in vocal quality, no cough, and no decline in respiratory status during/post trials. O2 sats remained 98%. Oral phase was adequate for bolus management, mastication, and oral clearing of the boluses given. Mastication of soft solids appropriate. Pt required feeding support d/t weak UEs baseline. OM exam appeared [facility] w/ No unilateral lingual/labial weakness noted. Some confusion of OM tasks and oral care.   Thin liquids were NOT given d/t recent coughing w/ NSG -- will f/u next 1-2 days for ongoing assessment, trials to upgrade. Conservative approach in setting of acute illness.    D/t pt's current presentation, MD is requesting a Liquid diet -- Nectar consistency is rec'd/ordered. Recommend initiation of the dysphagia level 3(mech soft) w/ Nectar liquids when MD is ready to upgrade pt's diet to foods/solids. NSG updated.  Recommend general aspiration precautions; reduce Distractions during meals and engage pt during po's at meal for help w/ self-feeding. Pills Crushed in Puree for safer swallowing. Support w/ feeding at meals as needed. Reflux precautions. GI is following. GI/MD/NSG updated.   ST services will follow w/ ongoing assessment and trials to thin liquids as tolerates. Precautions posted in room. Education w/ pt on aspiration precautions. Recommend Dietician f/u. SLP Visit Diagnosis: Dysphagia, unspecified (R13.10)  Aspiration Risk  Mild aspiration risk;Risk for inadequate nutrition/hydration  (acute illness; GI illness)    Diet Recommendation   Clear liquid - Nectar per MD for now. Aspiration precautions. Reflux precautions.  Medication Administration: Crushed with puree    Other  Recommendations Recommended Consults: Consider GI evaluation;Consider esophageal assessment (Dietician; Palliative Care) Oral Care Recommendations: Oral care BID;Oral care before and after PO;Staff/trained caregiver to provide oral care Caregiver Recommendations: Avoid jello, ice cream, thin soups, popsicles;Remove water pitcher;Have oral suction available    Recommendations for follow up therapy are one component of a multi-disciplinary discharge planning process, led by the attending physician.  Recommendations may be updated based on patient status, additional functional criteria and insurance authorization.  Follow up Recommendations  (TBD)      Assistance Recommended at Discharge  Full at meals  Functional Status Assessment Patient has had a recent decline in their functional status and demonstrates the ability to make significant improvements in function in a reasonable and predictable amount of time.  Frequency and Duration min 2x/week  1 week       Prognosis Prognosis for improved oropharyngeal function: Fair Barriers to Reach Goals: Cognitive deficits;Time post onset;Severity of deficits Barriers/Prognosis Comment: GI deficits - N/V      Swallow Study   General Date of Onset: 11/26/22 HPI: Pt is a 89 y.o. male with medical history significant of type 2 diabetes, sleep apnea, presented with sepsis, vomiting and diarrhea w/ ? aspiration pneumonia, lethargy. Per report, patient arrived from rehab facility with persistent vomiting and diarrhea.  No reports of black/bloody or green emesis or diarrhea.  No reported fevers or chills.  Was unresponsive but is more alert and responsive at the time of this evaluation.  Per MD note/report, patient with generalized abdominal pain.  No reports of  shortness of breath. Positive sick contacts and wife with similar GI symptoms for multiple days per chart note. Type of Study: Bedside Swallow Evaluation Previous Swallow Assessment: 2021 - dys level 2 w/ thin liquids then Diet Prior to this Study: NPO Temperature Spikes Noted: No (wbc and lactic acid elevated) Respiratory Status: Room air History of Recent Intubation: No Behavior/Cognition: Alert;Cooperative;Pleasant mood;Confused;Distractible;Requires cueing Oral Cavity Assessment: Within Functional Limits Oral Care Completed by SLP: Yes Oral Cavity - Dentition: Adequate natural dentition;Missing dentition (few) Self-Feeding Abilities:  (n/a) Patient Positioning: Upright in bed (needed positioning support) Baseline Vocal Quality: Normal Volitional Cough: Strong Volitional Swallow: Able to elicit    Oral/Motor/Sensory Function Overall Oral Motor/Sensory Function: Within functional limits   Ice Chips Ice chips: Within functional limits Presentation: Spoon (fed; 3 trials)   Thin Liquid Thin Liquid: Not tested Other Comments: recent overt coughing w/ thins w/ NSG    Nectar Thick Nectar Thick Liquid: Within functional limits Presentation: Straw (fed; 4 ozs) Other Comments: min impulsive   Honey Thick Honey Thick Liquid: Not tested   Puree Puree: Within functional limits Presentation: Spoon (fed; 7 trials)   Solid     Solid: Within functional limits Presentation: Spoon (fed; 4 trials)         Orinda Kenner, MS, CCC-SLP Speech Language Pathologist Rehab Services; [place] [phone] (ascom) Hezakiah Champeau 11/26/2022,5:20 PM

## 2022-11-26 NOTE — Progress Notes (Signed)
Case discussed with on-call ICU doc Dr. Patricia Pesa re- persistent lactic acidosis Case was formally reviewed with recommendation to DC trending lactic since lactic acidosis likely secondary to active GI illness with patient being overall stable. Patient also evaluated the bedside with mentation improving though patient has persistent confusion, patient is able to answer some questions including the reason why he is in the hospital as well as vomiting and diarrhea. Will monitor for now. Patient is asking for water as well as wearing some mild abdominal pain Will start as needed Tylenol Will tentatively allow for clears pending bedside swallow evaluation

## 2022-11-26 NOTE — ED Notes (Signed)
Pt had had a large BM when this RN went to administer the medication. Pt was cleaned up and new depends placed.

## 2022-11-26 NOTE — ED Triage Notes (Signed)
Pt presents via [facility] from [place] for complaints of emesis and diarrhea. Pts wife has the stomach flu and the patient has been exposed over the last several days. Emesis is bright yellow in color. Denies CP or SOB.

## 2022-11-26 NOTE — Sepsis Progress Note (Signed)
Notified provider of need to order another repeat lactic acid, as the second was higher than the first.

## 2022-11-26 NOTE — Progress Notes (Signed)
Pharmacy Antibiotic Note  Cory [name] is a 88 y.o. male admitted on 11/26/2022 with  N/V/D and sepsis due to undetermined organism. GI Panel negative. There is concern for aspiration pneumonia. Pharmacy has been consulted for Vancomycin and Cefepime dosing.  Plan: Will order Vancomycin '1250mg'$  IV bolus. Followed by Vancomycin 1000 mg IV Q 24 hrs. Goal AUC 400-550. Expected AUC: 440 SCr used: 1.11  Start Cefepime 2g IV every 12 hours for CrCl <77m/min  Patient is also receiving metronidazole every 12 hours. Pharmacy will continue to monitor renal function and adjust doses as needed.    Height: '5\' 8"'$  (172.7 cm) Weight: 77.7 kg (171 lb 4.8 oz) IBW/kg (Calculated) : 68.4  Temp (24hrs), Avg:99.4 F (37.4 C), Min:98.1 F (36.7 C), Max:101.4 F (38.6 C)  Recent Labs  Lab 11/26/22 0135 11/26/22 0221 11/26/22 0552  WBC 12.7*  --   --   CREATININE  --  1.11  --   LATICACIDVEN 3.4*  --  5.4*    Estimated Creatinine Clearance: 43.6 mL/min (by C-G formula based on SCr of 1.11 mg/dL).    No Known Allergies  Antimicrobials this admission: 2/28 cefepime >>  2/28 vancomycin  >>  2/28 flagyl>>   Microbiology results: GI PCR: negative  Bcx: Pending  C. Diff: negative   Thank you for allowing pharmacy to be a part of this patient's care.  SPernell Dupre PharmD, BCPS Clinical Pharmacist 11/26/2022 8:48 AM

## 2022-11-26 NOTE — ED Notes (Signed)
Unable to obtain lactic. Phlebotomy notified and sts they will be able to send someone at 0600.

## 2022-11-26 NOTE — H&P (Signed)
History and Physical    Patient: Cory [name] [id number] DOB: 09-21-1931 DOA: 11/26/2022 DOS: the patient was seen and examined on 11/26/2022 PCP: Madelyn Brunner, MD  Patient coming from: SNF  Chief Complaint:  Chief Complaint  Patient presents with   Emesis   HPI: Cory [name] is a 89 y.o. male with medical history significant of type 2 diabetes, sleep apnea, presented with sepsis,?  Aspiration pneumonia, vomiting and diarrhea, lethargy..  Limited history in the setting of lethargy.  Per report, patient arrived from rehab facility with persistent vomiting and diarrhea.  No reports of black/bloody or green emesis or diarrhea.  No reported fevers or chills.  Was unresponsive but reactive to painful stimuli.  Per report, patient with generalized abdominal pain.  No reports of shortness of breath.  Unclear about medications at present.  Positive no sick contacts and wife with similar symptoms for multiple days. Presented to the ER Tmax of 101.4, blood pressure stable, satting well on room air.  White count 12.7, hemoglobin 17.2.  COVID-negative.  C. difficile screen negative.  GI panel also negative.  Urinalysis not indicative of infection.  Creatinine 1.1.  Lactate 3.4-5.4.  CT of the abdomen pelvis negative for any intra-abdominal findings but does show patchy bilateral airspace disease with possibility of aspiration pneumonia. Review of Systems: unable to review all systems due to the inability of the patient to answer questions. Past Medical History:  Diagnosis Date   Diabetes mellitus without complication (HCC)    OSA (obstructive sleep apnea)    Thyroid disease    History reviewed. No pertinent surgical history. Social History:  reports that he has never smoked. He has never used smokeless tobacco. He reports current alcohol use. No history on file for drug use.  No Known Allergies  History reviewed. No pertinent family history.  Prior to Admission medications   Medication  Sig Start Date End Date Taking? Authorizing Provider  acetaminophen (TYLENOL) 500 MG tablet Take 1,000 mg by mouth every 8 (eight) hours as needed.   Yes [provider]  apixaban (ELIQUIS) 5 MG TABS tablet Two tabs po twice a day for five days then one tab po twice a day afterwards 06/19/20  Yes Wieting, Richard, MD  Baclofen 5 MG TABS Take 1 tablet by mouth daily. 11/03/22  Yes [provider]  cyanocobalamin 1000 MCG tablet Take 1,000 mg by mouth daily.   Yes [provider]  gabapentin (NEURONTIN) 100 MG capsule Take 100 mg by mouth 2 (two) times daily. 11/21/22  Yes [provider]  HUMALOG KWIKPEN 100 UNIT/ML KwikPen Inject 2-8 Units into the skin 4 (four) times daily. Per sliding scale 11/03/22  Yes [provider]  hydrocortisone cream 1 % Apply 1 Application topically as needed. 11/03/22  Yes [provider]  LEVEMIR FLEXPEN 100 UNIT/ML FlexPen Inject 12-15 Units into the skin 2 (two) times daily. 15 units qam 12 units qpm 11/08/22  Yes [provider]  levothyroxine (SYNTHROID) 100 MCG tablet Take 100 mcg by mouth daily. 10/17/22  Yes [provider]  LIDOCAINE PAIN RELIEF 4 % 1 patch daily. 07/11/22  Yes [provider]  Menthol, Topical Analgesic, (BIOFREEZE) 4 % GEL Apply 1 Application topically 3 (three) times daily.   Yes [provider]  Menthol-Zinc Oxide (CALMOSEPTINE) 0.44-20.6 % OINT Apply topically.   Yes [provider]  Multiple Vitamin (MULTIVITAMIN WITH MINERALS) TABS tablet Take 1 tablet by mouth daily.   Yes [provider]  sennosides-docusate sodium (SENOKOT-S) 8.6-50 MG tablet Take 2 tablets by mouth daily.   Yes [provider]  tamsulosin (FLOMAX) 0.4 MG CAPS capsule Take 1 capsule by mouth daily. 07/29/16  Yes [provider]  ascorbic acid (VITAMIN C) 500 MG tablet Take 1 tablet (500 mg total) by mouth daily. Patient not taking: Reported on 11/26/2022  06/20/20   Loletha Grayer, MD  co-enzyme Q-10 30 MG capsule Take 30 mg by mouth daily. Patient not taking: Reported on 11/26/2022    [provider]  guaiFENesin-dextromethorphan (ROBITUSSIN DM) 100-10 MG/5ML syrup Take 10 mLs by mouth every 4 (four) hours as needed for cough. Patient not taking: Reported on 11/26/2022 06/19/20   Loletha Grayer, MD  insulin glargine (LANTUS) 100 UNIT/ML injection Inject 0.1 mLs (10 Units total) into the skin 2 (two) times daily. Patient not taking: Reported on 11/26/2022 06/19/20   Loletha Grayer, MD  predniSONE (DELTASONE) 20 MG tablet Two tabs po daily for three days then stop Patient not taking: Reported on 11/26/2022 06/19/20   Loletha Grayer, MD  pyridoxine (B-6) 100 MG tablet Take 100 mg by mouth daily. Patient not taking: Reported on 11/26/2022    [provider]  vitamin E 1000 UNIT capsule Take 1,000 Units by mouth daily. Patient not taking: Reported on 11/26/2022    [provider]  zinc sulfate 220 (50 Zn) MG capsule Take 1 capsule (220 mg total) by mouth daily. Patient not taking: Reported on 11/26/2022 06/20/20   Loletha Grayer, MD    Physical Exam: Vitals:   11/26/22 0500 11/26/22 0700 11/26/22 0750 11/26/22 0800  BP: (!) 102/56 (!) 116/58  (!) 117/54  Pulse: 84 73  71  Resp: 19 (!) 24  19  Temp:   98.6 F (37 C)   TempSrc:   Axillary   SpO2: 91% 92%  92%  Weight:      Height:       Physical Exam Constitutional:      Appearance: He is obese.  HENT:     Head: Normocephalic.     Mouth/Throat:     Mouth: Mucous membranes are dry.  Eyes:     Pupils: Pupils are equal, round, and reactive to light.  Cardiovascular:     Rate and Rhythm: Normal rate and regular rhythm.  Pulmonary:     Effort: Pulmonary effort is normal.  Abdominal:     General: Bowel sounds are normal.  Musculoskeletal:        General: Normal range of motion.  Skin:    General: Skin is warm.  Neurological:     Comments: Patient  lethargic, but does respond to sternal rubbing. Appears to be sleepy Otherwise nonfocal neuroexam  Psychiatric:        Mood and Affect: Mood normal.   \ Data Reviewed:  Results including labs and imaging independently reviewed.   Assessment and Plan: * Sepsis due to undetermined organism [facility]) Meets sepsis criteria based on T 101.4, WBC 12  Some concern for GI source given vomiting, diarrhea  Also w/ ? Aspiration PNA on imaging  IV vancomycin, cefepime, Flagyl for infectious coverage COVID, C. difficile and GI panel negative at present Panculture Lactate 3.4-5.4 Status post LR bolus in the ER Continue with LR maintenance IV fluids Follow   Vomiting and diarrhea Recurrent vomiting and diarrhea per report in setting of active sepsis C. difficile and GI panel grossly negative at present On cefepime, vancomycin and Flagyl for infectious coverage in the interim  Noted positive recent sick contacts with similar symptoms per report Will otherwise monitor for now Contact precautions Follow  Aspiration pneumonia ([place]) Some concern for aspiration pneumonia on imaging No hypoxia present Noted concomitant sepsis On cefepime, flagyl, vancomycin for infectious coverage in the setting of sepsis with GI source.  Adjust antibiotics as clinically appropriate.     Lactic acidosis Lactate 3.4-5.4 in the setting of active sepsis with associated GI source and?  Aspiration pneumonia Status post 2 to 3 L LR bolus in ER Will continue with LR maintenance IV fluids for now Follow.  Type 2 diabetes mellitus with hyperlipidemia (HCC) SSI A1c  Lethargy Baseline lethargy on presentation with unclear baseline Does respond to sternal rubs and does answer some questions albeit minimally Suspect likely multifactorial with overlapping sepsis Will monitor for now Consider imaging and further evaluation if symptoms persist or there is a significant deviation from baseline.      Advance Care  Planning:   Code Status: DNR per report, plan for IV antibiotics and IV fluid management with no aggressive measures including intubation or chest compressions.  Consults: none   Family Communication: No family at the bedside    Severity of Illness: The appropriate patient status for this patient is INPATIENT. Inpatient status is judged to be reasonable and necessary in order to provide the required intensity of service to ensure the patient's safety. The patient's presenting symptoms, physical exam findings, and initial radiographic and laboratory data in the context of their chronic comorbidities is felt to place them at high risk for further clinical deterioration. Furthermore, it is not anticipated that the patient will be medically stable for discharge from the hospital within 2 midnights of admission.   * I certify that at the point of admission it is my clinical judgment that the patient will require inpatient hospital care spanning beyond 2 midnights from the point of admission due to high intensity of service, high risk for further deterioration and high frequency of surveillance required.*  Author: Deneise Lever, MD 11/26/2022 8:24 AM  For on call review [email or web address].

## 2022-11-26 NOTE — Assessment & Plan Note (Signed)
Recurrent vomiting and diarrhea per report in setting of active sepsis C. difficile and GI panel grossly negative at present On cefepime, vancomycin and Flagyl for infectious coverage in the interim Noted positive recent sick contacts with similar symptoms per report Will otherwise monitor for now Contact precautions Follow

## 2022-11-26 NOTE — ED Notes (Signed)
Patient transported to CT 

## 2022-11-26 NOTE — Assessment & Plan Note (Signed)
SSI A1c

## 2022-11-26 NOTE — ED Notes (Signed)
Pt brought to ed rm 19 at this time, this RN now assuming care.

## 2022-11-26 NOTE — ED Notes (Signed)
Attempted to collect blood. Pt is hard stick. Blood collected and all labs sent to lab at this time. Pt resting comfortably.

## 2022-11-27 DIAGNOSIS — F0390 Unspecified dementia without behavioral disturbance: Secondary | ICD-10-CM | POA: Insufficient documentation

## 2022-11-27 DIAGNOSIS — L8990 Pressure ulcer of unspecified site, unspecified stage: Secondary | ICD-10-CM | POA: Insufficient documentation

## 2022-11-27 DIAGNOSIS — A419 Sepsis, unspecified organism: Secondary | ICD-10-CM | POA: Diagnosis not present

## 2022-11-27 LAB — CBC WITH DIFFERENTIAL/PLATELET
Abs Immature Granulocytes: 0.03 10*3/uL (ref 0.00–0.07)
Basophils Absolute: 0 10*3/uL (ref 0.0–0.1)
Basophils Relative: 0 %
Eosinophils Absolute: 0 10*3/uL (ref 0.0–0.5)
Eosinophils Relative: 0 %
HCT: 40.6 % (ref 39.0–52.0)
Hemoglobin: 13.3 g/dL (ref 13.0–17.0)
Immature Granulocytes: 0 %
Lymphocytes Relative: 18 %
Lymphs Abs: 2.1 10*3/uL (ref 0.7–4.0)
MCH: 31.7 pg (ref 26.0–34.0)
MCHC: 32.8 g/dL (ref 30.0–36.0)
MCV: 96.9 fL (ref 80.0–100.0)
Monocytes Absolute: 0.9 10*3/uL (ref 0.1–1.0)
Monocytes Relative: 7 %
Neutro Abs: 8.6 10*3/uL — ABNORMAL HIGH (ref 1.7–7.7)
Neutrophils Relative %: 75 %
Platelets: 157 10*3/uL (ref 150–400)
RBC: 4.19 MIL/uL — ABNORMAL LOW (ref 4.22–5.81)
RDW: 14.4 % (ref 11.5–15.5)
WBC: 11.7 10*3/uL — ABNORMAL HIGH (ref 4.0–10.5)
nRBC: 0 % (ref 0.0–0.2)

## 2022-11-27 LAB — LACTIC ACID, PLASMA: Lactic Acid, Venous: 1.8 mmol/L (ref 0.5–1.9)

## 2022-11-27 LAB — COMPREHENSIVE METABOLIC PANEL
ALT: 16 U/L (ref 0–44)
AST: 36 U/L (ref 15–41)
Albumin: 2.6 g/dL — ABNORMAL LOW (ref 3.5–5.0)
Alkaline Phosphatase: 49 U/L (ref 38–126)
Anion gap: 9 (ref 5–15)
BUN: 22 mg/dL (ref 8–23)
CO2: 25 mmol/L (ref 22–32)
Calcium: 8.1 mg/dL — ABNORMAL LOW (ref 8.9–10.3)
Chloride: 103 mmol/L (ref 98–111)
Creatinine, Ser: 1.08 mg/dL (ref 0.61–1.24)
GFR, Estimated: >60 mL/min (ref >60)
Glucose, Bld: 121 mg/dL — ABNORMAL HIGH (ref 70–99)
Potassium: 4.2 mmol/L (ref 3.5–5.1)
Sodium: 137 mmol/L (ref 135–145)
Total Bilirubin: 0.5 mg/dL (ref 0.3–1.2)
Total Protein: 5.8 g/dL — ABNORMAL LOW (ref 6.5–8.1)

## 2022-11-27 LAB — PROCALCITONIN: Procalcitonin: 4.57 ng/mL

## 2022-11-27 MED ORDER — GABAPENTIN 100 MG PO CAPS
100.0000 mg | ORAL_CAPSULE | Freq: Two times a day (BID) | ORAL | Status: DC
  Administered 2022-11-27 – 2022-11-28 (×3): 100 mg via ORAL
  Filled 2022-11-27 (×3): qty 1

## 2022-11-27 MED ORDER — APIXABAN 5 MG PO TABS
5.0000 mg | ORAL_TABLET | Freq: Two times a day (BID) | ORAL | Status: DC
  Administered 2022-11-27 – 2022-11-28 (×3): 5 mg via ORAL
  Filled 2022-11-27 (×3): qty 1

## 2022-11-27 MED ORDER — SODIUM CHLORIDE 0.9 % IV SOLN
3.0000 g | Freq: Four times a day (QID) | INTRAVENOUS | Status: DC
  Administered 2022-11-27 – 2022-11-28 (×5): 3 g via INTRAVENOUS
  Filled 2022-11-27: qty 8
  Filled 2022-11-27: qty 3
  Filled 2022-11-27 (×3): qty 8
  Filled 2022-11-27: qty 3
  Filled 2022-11-27: qty 8

## 2022-11-27 MED ORDER — TAMSULOSIN HCL 0.4 MG PO CAPS
0.4000 mg | ORAL_CAPSULE | Freq: Every day | ORAL | Status: DC
  Administered 2022-11-27 – 2022-11-28 (×2): 0.4 mg via ORAL
  Filled 2022-11-27 (×2): qty 1

## 2022-11-27 MED ORDER — SODIUM CHLORIDE 0.9 % IV SOLN: INTRAVENOUS | Status: DC

## 2022-11-27 MED ORDER — LEVOTHYROXINE SODIUM 100 MCG PO TABS
100.0000 ug | ORAL_TABLET | Freq: Every day | ORAL | Status: DC
  Administered 2022-11-28: 100 ug via ORAL
  Filled 2022-11-27: qty 1

## 2022-11-27 NOTE — Plan of Care (Signed)

## 2022-11-27 NOTE — Consult Note (Signed)
ANTICOAGULATION CONSULT NOTE - Initial Consult  Pharmacy Consult for Apixaban Indication: Hx of PE and Afib  No Known Allergies  Patient Measurements: Height: '5\' 8"'$  (172.7 cm) Weight: 77.7 kg (171 lb 4.8 oz) IBW/kg (Calculated) : 68.4   Vital Signs: Temp: 98.5 F (36.9 C) (02/29 0851) Temp Source: Oral (02/29 0512) BP: 106/50 (02/29 0851) Pulse Rate: 73 (02/29 0851)  Labs: Recent Labs    11/26/22 0135 11/26/22 0221 11/26/22 0834 11/26/22 1123 11/27/22 0438  HGB 17.2*  --  14.9  --  13.3  HCT 55.2*  --  47.1  --  40.6  PLT 188  --  183  --  157  CREATININE  --    < > 1.06 1.11 1.08   < > = values in this interval not displayed.    Estimated Creatinine Clearance: 44.9 mL/min (by C-G formula based on SCr of 1.08 mg/dL).   Medical History: Past Medical History:  Diagnosis Date   Diabetes mellitus without complication (HCC)    OSA (obstructive sleep apnea)    Thyroid disease     Medications:  Eliquis 5 mg po BID PTA  Assessment: 89 yo male presented to ED from rehab facility with vomiting and diarrhea.  Patient has history of PE and Afib.  Pharmacy consulted for apixaban dosing.  Goal of Therapy:  Monitor platelets by anticoagulation protocol: Yes   Plan:  --Resume Apixiban 5 mg po BID --CBC at least every 72 hours  Lorin Picket, PharmD 11/27/2022,9:00 AM

## 2022-11-27 NOTE — Progress Notes (Signed)
Pharmacy Antibiotic Note  Cory [name] is a 89 y.o. male admitted on 11/26/2022 with  N/V/D and sepsis due to undetermined organism. GI Panel negative. There is concern for aspiration pneumonia. Pharmacy has been consulted for ampicillin/sulbactam dosing.  Today, 11/27/2022 Day #2 antibiotics WBC improving Renal: SCr 1.08 - stable Afebrile Blood cx NGTD C difficile and GI panels neg  Plan: Broad spectrum antibiotics to be narrowed to ampicillin/sulbactam to treat aspiration pneumonia.  Start ampicillin/sulbactam 3gm IV q6h Pharmacy to sign-off and follow peripherally for need to adjust dose for renal function.    Height: '5\' 8"'$  (172.7 cm) Weight: 77.7 kg (171 lb 4.8 oz) IBW/kg (Calculated) : 68.4  Temp (24hrs), Avg:98.2 F (36.8 C), Min:97.7 F (36.5 C), Max:99 F (37.2 C)  Recent Labs  Lab 11/26/22 0135 11/26/22 0221 11/26/22 0552 11/26/22 0834 11/26/22 1123 11/27/22 0438  WBC 12.7*  --   --  20.8*  --  11.7*  CREATININE  --  1.11  --  1.06 1.11 1.08  LATICACIDVEN 3.4*  --  5.4* 5.4* 5.8*  --      Estimated Creatinine Clearance: 44.9 mL/min (by C-G formula based on SCr of 1.08 mg/dL).    No Known Allergies  Antimicrobials this admission: 2/28 cefepime >> 2/29 2/28 vancomycin  >> 2/29 2/28 flagyl>>2/29 2/29 amp/sulb >>   Microbiology results: GI PCR: negative  Bcx: NGTD  C. Diff: negative  MRSA PCR neg  Thank you for allowing pharmacy to be a part of this patient's care.  Doreene Eland, PharmD, BCPS, BCIDP Work Cell: [phone] 11/27/2022 8:38 AM

## 2022-11-27 NOTE — Progress Notes (Addendum)
PROGRESS NOTE    Cory [name]  [id number] DOB: November 14, 1930 DOA: 11/26/2022 PCP: Madelyn Brunner, MD  Outpatient Specialists: lives at snf    Brief Narrative:   Cory [name] is a 89 y.o. male with medical history significant of type 2 diabetes, sleep apnea, presented with sepsis,?  Aspiration pneumonia, vomiting and diarrhea, lethargy..  Limited history in the setting of lethargy.  Per report, patient arrived from rehab facility with persistent vomiting and diarrhea.  No reports of black/bloody or green emesis or diarrhea.  No reported fevers or chills.  Was unresponsive but reactive to painful stimuli.  Per report, patient with generalized abdominal pain.  No reports of shortness of breath.  Unclear about medications at present.  Positive no sick contacts and wife with similar symptoms for multiple days. Presented to the ER Tmax of 101.4, blood pressure stable, satting well on room air.  White count 12.7, hemoglobin 17.2.  COVID-negative.  C. difficile screen negative.  GI panel also negative.  Urinalysis not indicative of infection.  Creatinine 1.1.  Lactate 3.4-5.4.  CT of the abdomen pelvis negative for any intra-abdominal findings but does show patchy bilateral airspace disease with possibility of aspiration pneumonia.   Assessment & Plan:   Principal Problem:   Sepsis due to undetermined organism [facility]) Active Problems:   Lethargy   AF (paroxysmal atrial fibrillation) (HCC)   Type 2 diabetes mellitus with hyperlipidemia (HCC)   Pulmonary embolus (HCC)   Lactic acidosis   Aspiration pneumonia (HCC)   Vomiting and diarrhea   Pressure injury of skin   Dementia ([place])  # Gastroenteritis Appears to be viral. Multiple family members with GI illness, patient with symptoms for a few days. C diff, gi pathogen panel negative. Ua not suggestive of infection. C c/a/p unremarkable save for signs possible aspiration.  - not currently on fluids, will start them @ 100 - stop  vanc/cefepime/flagyl - monitor cultures  # Aspiration pneumonia? CT findings suggestive of this and step-daughter says there as also concern for aspiration at his facility. Here no cough, breathing comfortably on room air. Slp has cleared for dysphagia 3 diet w/ nectar thick liquids - will start unasyn  # Lactic acidosis 2/2 volume dehydration from gastroenteritis - continue fluids, f/u repeat lactate  # T2DM Here glucose appropriate - SSI for now - holding home levemir  # Hypothyroid - home synthroid  # BPH - home flomax  # A-fib Rate is controlled, not on a rate control med - cont home apixaban  # Hx PE - cont home apixaban  # Dementia No behavioral disturbance, resides at snf, is bedbound @ baseline  # Chronic pain - home gabapentin   DVT prophylaxis: home apixaban Code Status: dnr Family Communication: step-daughter updated telephonically 2/29  Level of care: Med-Surg Status is: Inpatient Remains inpatient appropriate because: severity of illness    Consultants:  none  Procedures: none  Antimicrobials:  S/p vanc/cefepime/flagyl Unasyn>    Subjective: Reports feeling achy  Objective: Vitals:   11/26/22 0950 11/26/22 1720 11/26/22 2043 11/27/22 0512  BP: 121/67 (!) 106/59 (!) 120/47 119/66  Pulse: 65 70 (!) 51 (!) 51  Resp: '17 16 18 18  '$ Temp: 98.1 F (36.7 C) 97.9 F (36.6 C) 97.7 F (36.5 C) 99 F (37.2 C)  TempSrc: Oral Oral Oral Oral  SpO2: 99% 98% 97% 96%  Weight:      Height:        Intake/Output Summary (Last 24 hours) at 11/27/2022  [place] filed at 11/27/2022 0650 Gross per 24 hour  Intake 2448.72 ml  Output 400 ml  Net 2048.72 ml   Filed Weights   11/26/22 0118  Weight: 77.7 kg    Examination:  General exam: Appears calm and comfortable  Respiratory system: Clear to auscultation save for rales at bases. Cardiovascular system: S1 & S2 heard, RRR. No JVD, murmurs, rubs, gallops or clicks.   Gastrointestinal  system: Abdomen is obese, soft and nontender. No organomegaly or masses felt. Normal bowel sounds heard. Central nervous system: somnolent but rouses, oriented to self and place Extremities: Symmetric 5 x 5 power. Skin: No rashes, lesions or ulcers Psychiatry: confused    Data Reviewed: I have personally reviewed following labs and imaging studies  CBC: Recent Labs  Lab 11/26/22 0135 11/26/22 0834 11/27/22 0438  WBC 12.7* 20.8* 11.7*  NEUTROABS 10.0*  --  8.6*  HGB 17.2* 14.9 13.3  HCT 55.2* 47.1 40.6  MCV 100.0 99.6 96.9  PLT 188 183 [id number]   Basic Metabolic Panel: Recent Labs  Lab 11/26/22 0221 11/26/22 0834 11/26/22 1123 11/27/22 0438  NA 139  --  136 137  K 4.4  --  5.0 4.2  CL 103  --  99 103  CO2 26  --  26 25  GLUCOSE 120*  --  168* 121*  BUN 16  --  17 22  CREATININE 1.11 1.06 1.11 1.08  CALCIUM 9.2  --  8.8* 8.1*   GFR: Estimated Creatinine Clearance: 44.9 mL/min (by C-G formula based on SCr of 1.08 mg/dL). Liver Function Tests: Recent Labs  Lab 11/26/22 0221 11/26/22 1123 11/27/22 0438  AST 33 44* 36  ALT '16 17 16  '$ ALKPHOS 75 66 49  BILITOT 0.7 0.7 0.5  PROT 7.6 7.2 5.8*  ALBUMIN 3.3* 3.0* 2.6*   Recent Labs  Lab 11/26/22 0221  LIPASE 23   Recent Labs  Lab 11/26/22 0834  AMMONIA 51*   Coagulation Profile: No results for input(s): "INR", "PROTIME" in the last 168 hours. Cardiac Enzymes: No results for input(s): "CKTOTAL", "CKMB", "CKMBINDEX", "TROPONINI" in the last 168 hours. BNP (last 3 results) No results for input(s): "PROBNP" in the last 8760 hours. HbA1C: No results for input(s): "HGBA1C" in the last 72 hours. CBG: Recent Labs  Lab 11/26/22 0956 11/26/22 1145 11/26/22 1722  GLUCAP 128* 144* 178*   Lipid Profile: No results for input(s): "CHOL", "HDL", "LDLCALC", "TRIG", "CHOLHDL", "LDLDIRECT" in the last 72 hours. Thyroid Function Tests: No results for input(s): "TSH", "T4TOTAL", "FREET4", "T3FREE", "THYROIDAB" in the last  72 hours. Anemia Panel: No results for input(s): "VITAMINB12", "FOLATE", "FERRITIN", "TIBC", "IRON", "RETICCTPCT" in the last 72 hours. Urine analysis:    Component Value Date/Time   COLORURINE YELLOW (A) 11/26/2022 0221   APPEARANCEUR CLEAR (A) 11/26/2022 0221   APPEARANCEUR Clear 11/06/2013 1847   LABSPEC 1.018 11/26/2022 0221   LABSPEC 1.018 11/06/2013 1847   PHURINE 5.0 11/26/2022 0221   GLUCOSEU NEGATIVE 11/26/2022 0221   GLUCOSEU >=500 11/06/2013 1847   HGBUR NEGATIVE 11/26/2022 0221   BILIRUBINUR NEGATIVE 11/26/2022 0221   BILIRUBINUR Negative 11/06/2013 1847   KETONESUR NEGATIVE 11/26/2022 0221   PROTEINUR NEGATIVE 11/26/2022 0221   NITRITE NEGATIVE 11/26/2022 0221   LEUKOCYTESUR NEGATIVE 11/26/2022 0221   LEUKOCYTESUR Negative 11/06/2013 1847   Sepsis Labs: '@LABRCNTIP'$ (procalcitonin:4,lacticidven:4)  ) Recent Results (from the past 240 hour(s))  Blood culture (routine x 2)     Status: None (Preliminary result)   Collection Time: 11/26/22  1:35 AM   Specimen: BLOOD  Result Value Ref Range Status   Specimen Description BLOOD LEFT FA  Final   Special Requests   Final    BOTTLES DRAWN AEROBIC AND ANAEROBIC Blood Culture results may not be optimal due to an inadequate volume of blood received in culture bottles   Culture   Final    NO GROWTH 1 DAY Performed at [facility], [address], [place], [place] [address]    Report Status PENDING  Incomplete  Resp panel by RT-PCR (RSV, Flu A&B, Covid) Anterior Nasal Swab     Status: None   Collection Time: 11/26/22  1:46 AM   Specimen: Anterior Nasal Swab  Result Value Ref Range Status   SARS Coronavirus 2 by RT PCR NEGATIVE NEGATIVE Final    Comment: (NOTE) SARS-CoV-2 target nucleic acids are NOT DETECTED.  The SARS-CoV-2 RNA is generally detectable in upper respiratory specimens during the acute phase of infection. The lowest concentration of SARS-CoV-2 viral copies this assay can detect is 138 copies/mL. A  negative result does not preclude SARS-Cov-2 infection and should not be used as the sole basis for treatment or other patient management decisions. A negative result may occur with  improper specimen collection/handling, submission of specimen other than nasopharyngeal swab, presence of viral mutation(s) within the areas targeted by this assay, and inadequate number of viral copies(<138 copies/mL). A negative result must be combined with clinical observations, patient history, and epidemiological information. The expected result is Negative.  Fact Sheet for Patients:  EntrepreneurPulse.com.au  Fact Sheet for Healthcare Providers:  IncredibleEmployment.be  This test is no t yet approved or cleared by the [place] FDA and  has been authorized for detection and/or diagnosis of SARS-CoV-2 by FDA under an Emergency Use Authorization (EUA). This EUA will remain  in effect (meaning this test can be used) for the duration of the COVID-19 declaration under Section 564(b)(1) of the Act, 21 U.S.C.section 360bbb-3(b)(1), unless the authorization is terminated  or revoked sooner.       Influenza A by PCR NEGATIVE NEGATIVE Final   Influenza B by PCR NEGATIVE NEGATIVE Final    Comment: (NOTE) The Xpert Xpress SARS-CoV-2/FLU/RSV plus assay is intended as an aid in the diagnosis of influenza from Nasopharyngeal swab specimens and should not be used as a sole basis for treatment. Nasal washings and aspirates are unacceptable for Xpert Xpress SARS-CoV-2/FLU/RSV testing.  Fact Sheet for Patients: EntrepreneurPulse.com.au  Fact Sheet for Healthcare Providers: IncredibleEmployment.be  This test is not yet approved or cleared by the [place] FDA and has been authorized for detection and/or diagnosis of SARS-CoV-2 by FDA under an Emergency Use Authorization (EUA). This EUA will remain in effect (meaning this test can  be used) for the duration of the COVID-19 declaration under Section 564(b)(1) of the Act, 21 U.S.C. section 360bbb-3(b)(1), unless the authorization is terminated or revoked.     Resp Syncytial Virus by PCR NEGATIVE NEGATIVE Final    Comment: (NOTE) Fact Sheet for Patients: EntrepreneurPulse.com.au  Fact Sheet for Healthcare Providers: IncredibleEmployment.be  This test is not yet approved or cleared by the [place] FDA and has been authorized for detection and/or diagnosis of SARS-CoV-2 by FDA under an Emergency Use Authorization (EUA). This EUA will remain in effect (meaning this test can be used) for the duration of the COVID-19 declaration under Section 564(b)(1) of the Act, 21 U.S.C. section 360bbb-3(b)(1), unless the authorization is terminated or revoked.  Performed at [facility], [phone] [place], [place] [address]   C Difficile Quick Screen w PCR reflex     Status: None   Collection Time: 11/26/22  2:10 AM   Specimen: STOOL  Result Value Ref Range Status   C Diff antigen NEGATIVE NEGATIVE Final   C Diff toxin NEGATIVE NEGATIVE Final   C Diff interpretation No C. difficile detected.  Final    Comment: Performed at [facility], [address] [place], [place] [address]  Gastrointestinal Panel by PCR , Stool     Status: None   Collection Time: 11/26/22  2:10 AM   Specimen: STOOL  Result Value Ref Range Status   Campylobacter species NOT DETECTED NOT DETECTED Final   Plesimonas shigelloides NOT DETECTED NOT DETECTED Final   Salmonella species NOT DETECTED NOT DETECTED Final   Yersinia enterocolitica NOT DETECTED NOT DETECTED Final   Vibrio species NOT DETECTED NOT DETECTED Final   Vibrio cholerae NOT DETECTED NOT DETECTED Final   Enteroaggregative E coli (EAEC) NOT DETECTED NOT DETECTED Final   Enteropathogenic E coli (EPEC) NOT DETECTED NOT DETECTED Final   Enterotoxigenic E coli (ETEC) NOT  DETECTED NOT DETECTED Final   Shiga like toxin producing E coli (STEC) NOT DETECTED NOT DETECTED Final   Shigella/Enteroinvasive E coli (EIEC) NOT DETECTED NOT DETECTED Final   Cryptosporidium NOT DETECTED NOT DETECTED Final   Cyclospora cayetanensis NOT DETECTED NOT DETECTED Final   Entamoeba histolytica NOT DETECTED NOT DETECTED Final   Giardia lamblia NOT DETECTED NOT DETECTED Final   Adenovirus F40/41 NOT DETECTED NOT DETECTED Final   Astrovirus NOT DETECTED NOT DETECTED Final   Norovirus GI/GII NOT DETECTED NOT DETECTED Final   Rotavirus A NOT DETECTED NOT DETECTED Final   Sapovirus (I, II, IV, and V) NOT DETECTED NOT DETECTED Final    Comment: Performed at [facility], [address] [place], [place] [address]  Blood culture (routine x 2)     Status: None (Preliminary result)   Collection Time: 11/26/22  2:21 AM   Specimen: BLOOD RIGHT ARM  Result Value Ref Range Status   Specimen Description BLOOD RIGHT ARM  Final   Special Requests   Final    BOTTLES DRAWN AEROBIC AND ANAEROBIC Blood Culture results may not be optimal due to an inadequate volume of blood received in culture bottles   Culture   Final    NO GROWTH 1 DAY Performed at [facility], [address] [place], [place] [address]    Report Status PENDING  Incomplete  MRSA Next Gen by PCR, Nasal     Status: None   Collection Time: 11/26/22  8:34 AM   Specimen: Nasal Mucosa; Nasal Swab  Result Value Ref Range Status   MRSA by PCR Next Gen NOT DETECTED NOT DETECTED Final    Comment: (NOTE) The GeneXpert MRSA Assay (FDA approved for NASAL specimens only), is one component of a comprehensive MRSA colonization surveillance program. It is not intended to diagnose MRSA infection nor to guide or monitor treatment for MRSA infections. Test performance is not FDA approved in patients less than 60 years old. Performed at [facility], [address] [place],  [address]           Radiology Studies: CT CHEST ABDOMEN PELVIS W CONTRAST  Result Date: 11/26/2022 CLINICAL DATA:  Emesis and diarrhea. Exposure to gastroenteritis. Sepsis. EXAM: CT CHEST, ABDOMEN, AND PELVIS WITH CONTRAST TECHNIQUE: Multidetector CT imaging of the chest, abdomen and pelvis was performed following  the standard protocol during bolus administration of intravenous contrast. RADIATION DOSE REDUCTION: This exam was performed according to the departmental dose-optimization program which includes automated exposure control, adjustment of the mA and/or kV according to patient size and/or use of iterative reconstruction technique. CONTRAST:  126m OMNIPAQUE IOHEXOL 300 MG/ML  SOLN COMPARISON:  Chest CTA 06/17/2020 FINDINGS: CT CHEST FINDINGS Cardiovascular: Normal heart size. No pericardial effusion. Extensive atheromatous calcification of the coronaries. No acute vascular finding Mediastinum/Nodes: No mass or adenopathy Lungs/Pleura: Patchy ground-glass opacity in the dependent lungs. The central airways are clear. No effusion or edema there is a subpleural scar-like opacity incited by the first costochondral junction on the left. Musculoskeletal: Generalized spondylosis with multi-level bridging osteophyte. CT ABDOMEN PELVIS FINDINGS Hepatobiliary: No focal liver abnormality.No evidence of biliary obstruction or stone. Pancreas: Generalized atrophy. Spleen: Unremarkable. Adrenals/Urinary Tract: Negative adrenals. No hydronephrosis or stone. Thick walled bladder with tiny dependent calcification. Stomach/Bowel: No obstruction. No appendicitis. Hazy small bowel mesentery attributed to remote panniculitis, essentially stable. Separate 2.3 cm calcified mass beneath the hepatic flexure which is stable. No associated adenopathy. Vascular/Lymphatic: No acute vascular abnormality. Ordinary atheromatous change. No mass or adenopathy. Reproductive:No acute finding Other: No ascites or pneumoperitoneum. Musculoskeletal:  Generalized spondylosis. IMPRESSION: 1. Patchy bilateral airspace disease, mainly dependent and likely from aspiration in this setting. 2. No acute intra-abdominal findings. Electronically Signed   By: JJorje GuildM.D.   On: 11/26/2022 04:37   DG Chest Portable 1 View  Result Date: 11/26/2022 CLINICAL DATA:  Fever EXAM: PORTABLE CHEST 1 VIEW COMPARISON:  07/31/2020 FINDINGS: Stable cardiomediastinal silhouette. No focal consolidation, pleural effusion, or pneumothorax. No acute osseous abnormality. IMPRESSION: No active disease. Electronically Signed   By: TPlacido SouM.D.   On: 11/26/2022 01:46        Scheduled Meds:  enoxaparin (LOVENOX) injection  40 mg Subcutaneous Q24H   Continuous Infusions:  sodium chloride Stopped (11/27/22 0640)   ceFEPime (MAXIPIME) IV 2 g (11/26/22 2300)   metronidazole 500 mg (11/27/22 0419)   vancomycin       LOS: 1 day     NDesma Maxim MD [facility]   If 7PM-7AM, please contact night-coverage [email or web address] Password [email or web address]2/29/2024, 8:17 AM

## 2022-11-27 NOTE — Plan of Care (Signed)
  Problem: Education: Goal: Knowledge of General Education information will improve Description: Including pain rating scale, medication(s)/side effects and non-pharmacologic comfort measures 11/27/2022 1654 by Samuella Cota, RN Outcome: Progressing 11/27/2022 1653 by Samuella Cota, RN Outcome: Progressing   Problem: Health Behavior/Discharge Planning: Goal: Ability to manage health-related needs will improve 11/27/2022 1654 by Samuella Cota, RN Outcome: Progressing 11/27/2022 1653 by Samuella Cota, RN Outcome: Progressing   Problem: Clinical Measurements: Goal: Ability to maintain clinical measurements within normal limits will improve 11/27/2022 1654 by Samuella Cota, RN Outcome: Progressing 11/27/2022 1653 by Samuella Cota, RN Outcome: Progressing Goal: Will remain free from infection 11/27/2022 1654 by Samuella Cota, RN Outcome: Progressing 11/27/2022 1653 by Samuella Cota, RN Outcome: Progressing Goal: Diagnostic test results will improve 11/27/2022 1654 by Samuella Cota, RN Outcome: Progressing 11/27/2022 1653 by Samuella Cota, RN Outcome: Progressing Goal: Respiratory complications will improve 11/27/2022 1654 by Samuella Cota, RN Outcome: Progressing 11/27/2022 1653 by Samuella Cota, RN Outcome: Progressing Goal: Cardiovascular complication will be avoided 11/27/2022 1654 by Samuella Cota, RN Outcome: Progressing 11/27/2022 1653 by Samuella Cota, RN Outcome: Progressing   Problem: Activity: Goal: Risk for activity intolerance will decrease 11/27/2022 1654 by Samuella Cota, RN Outcome: Progressing 11/27/2022 1653 by Samuella Cota, RN Outcome: Progressing   Problem: Nutrition: Goal: Adequate nutrition will be maintained 11/27/2022 1654 by Samuella Cota, RN Outcome: Progressing 11/27/2022 1653 by Samuella Cota, RN Outcome: Progressing   Problem: Coping: Goal: Level of anxiety will  decrease 11/27/2022 1654 by Samuella Cota, RN Outcome: Progressing 11/27/2022 1653 by Samuella Cota, RN Outcome: Progressing   Problem: Elimination: Goal: Will not experience complications related to bowel motility 11/27/2022 1654 by Samuella Cota, RN Outcome: Progressing 11/27/2022 1653 by Samuella Cota, RN Outcome: Progressing Goal: Will not experience complications related to urinary retention 11/27/2022 1654 by Samuella Cota, RN Outcome: Progressing 11/27/2022 1653 by Samuella Cota, RN Outcome: Progressing   Problem: Pain Managment: Goal: General experience of comfort will improve 11/27/2022 1654 by Samuella Cota, RN Outcome: Progressing 11/27/2022 1653 by Samuella Cota, RN Outcome: Progressing   Problem: Safety: Goal: Ability to remain free from injury will improve 11/27/2022 1654 by Samuella Cota, RN Outcome: Progressing 11/27/2022 1653 by Samuella Cota, RN Outcome: Progressing   Problem: Skin Integrity: Goal: Risk for impaired skin integrity will decrease 11/27/2022 1654 by Samuella Cota, RN Outcome: Progressing 11/27/2022 1653 by Samuella Cota, RN Outcome: Progressing   Problem: Fluid Volume: Goal: Hemodynamic stability will improve 11/27/2022 1654 by Samuella Cota, RN Outcome: Progressing 11/27/2022 1653 by Samuella Cota, RN Outcome: Progressing   Problem: Clinical Measurements: Goal: Diagnostic test results will improve 11/27/2022 1654 by Samuella Cota, RN Outcome: Progressing 11/27/2022 1653 by Samuella Cota, RN Outcome: Progressing Goal: Signs and symptoms of infection will decrease 11/27/2022 1654 by Samuella Cota, RN Outcome: Progressing 11/27/2022 1653 by Samuella Cota, RN Outcome: Progressing   Problem: Respiratory: Goal: Ability to maintain adequate ventilation will improve 11/27/2022 1654 by Samuella Cota, RN Outcome: Progressing 11/27/2022 1653 by Samuella Cota, RN Outcome: Progressing

## 2022-11-28 DIAGNOSIS — K529 Noninfective gastroenteritis and colitis, unspecified: Secondary | ICD-10-CM

## 2022-11-28 LAB — CBC
HCT: 37.2 % — ABNORMAL LOW (ref 39.0–52.0)
Hemoglobin: 11.9 g/dL — ABNORMAL LOW (ref 13.0–17.0)
MCH: 31.3 pg (ref 26.0–34.0)
MCHC: 32 g/dL (ref 30.0–36.0)
MCV: 97.9 fL (ref 80.0–100.0)
Platelets: 143 10*3/uL — ABNORMAL LOW (ref 150–400)
RBC: 3.8 MIL/uL — ABNORMAL LOW (ref 4.22–5.81)
RDW: 14.4 % (ref 11.5–15.5)
WBC: 9.1 10*3/uL (ref 4.0–10.5)
nRBC: 0 % (ref 0.0–0.2)

## 2022-11-28 LAB — BASIC METABOLIC PANEL
Anion gap: 7 (ref 5–15)
BUN: 22 mg/dL (ref 8–23)
CO2: 25 mmol/L (ref 22–32)
Calcium: 7.7 mg/dL — ABNORMAL LOW (ref 8.9–10.3)
Chloride: 103 mmol/L (ref 98–111)
Creatinine, Ser: 1.06 mg/dL (ref 0.61–1.24)
GFR, Estimated: >60 mL/min (ref >60)
Glucose, Bld: 125 mg/dL — ABNORMAL HIGH (ref 70–99)
Potassium: 3.7 mmol/L (ref 3.5–5.1)
Sodium: 135 mmol/L (ref 135–145)

## 2022-11-28 MED ORDER — CYANOCOBALAMIN 1000 MCG PO TABS: 1000.0000 ug | ORAL_TABLET | Freq: Every day | ORAL | Status: DC

## 2022-11-28 MED ORDER — AMOXICILLIN-POT CLAVULANATE 875-125 MG PO TABS: 1.0000 | ORAL_TABLET | Freq: Two times a day (BID) | ORAL | 0 refills | Status: DC

## 2022-11-28 NOTE — Discharge Summary (Signed)
Cory [name] [id number] DOB: 05-Feb-1931 DOA: 11/26/2022  PCP: Madelyn Brunner, MD  Admit date: 11/26/2022 Discharge date: 11/28/2022  Time spent: 35 minutes  Recommendations for Outpatient Follow-up:  Ensure adequate hydration     Discharge Diagnoses:  Principal Problem:   Gastroenteritis Active Problems:   Lethargy   AF (paroxysmal atrial fibrillation) (HCC)   Type 2 diabetes mellitus with hyperlipidemia (HCC)   Pulmonary embolus (HCC)   Sepsis due to undetermined organism ([place])   Lactic acidosis   Aspiration pneumonia (HCC)   Vomiting and diarrhea   Pressure injury of skin   Dementia ([place])   Discharge Condition: improved  Diet recommendation: regular  Filed Weights   11/26/22 0118  Weight: 77.7 kg    History of present illness:  From admission h and p  Cory [name] is a 89 y.o. male with medical history significant of type 2 diabetes, sleep apnea, presented with sepsis,?  Aspiration pneumonia, vomiting and diarrhea, lethargy..  Limited history in the setting of lethargy.  Per report, patient arrived from rehab facility with persistent vomiting and diarrhea.  No reports of black/bloody or green emesis or diarrhea.  No reported fevers or chills.  Was unresponsive but reactive to painful stimuli.  Per report, patient with generalized abdominal pain.  No reports of shortness of breath.  Unclear about medications at present.  Positive no sick contacts and wife with similar symptoms for multiple days. Presented to the ER Tmax of 101.4, blood pressure stable, satting well on room air.  White count 12.7, hemoglobin 17.2.  COVID-negative.  C. difficile screen negative.  GI panel also negative.  Urinalysis not indicative of infection.  Creatinine 1.1.  Lactate 3.4-5.4.  CT of the abdomen pelvis negative for any intra-abdominal findings but does show patchy bilateral airspace disease with possibility of aspiration pneumonia.  Hospital Course:   # Gastroenteritis Appears to be  viral. Multiple family members with GI illness, patient with symptoms for a few days. C diff, gi pathogen panel negative. Ua not suggestive of infection. C c/a/p unremarkable save for signs possible aspiration. Now tolerating by mouth, feeling improved. - ensure adequate hydration   # Aspiration pneumonia? CT findings suggestive of this and step-daughter says there as also concern for aspiration at his facility. Here no cough, breathing comfortably on room air. Diet advanced by slp - rx augmentin to complete 5 day course   # Lactic acidosis 2/2 volume dehydration from gastroenteritis. Resolved with fluids   # T2DM Here glucose appropriate   # Hypothyroid - home synthroid   # BPH - home flomax   # A-fib Rate is controlled, not on a rate control med - cont home apixaban   # Hx PE - cont home apixaban   # Dementia No behavioral disturbance, resides at snf, is bedbound @ baseline   # Chronic pain - home gabapentin  Procedures: none   Consultations: none  Discharge Exam: Vitals:   11/28/22 0556 11/28/22 1017  BP: 132/61 (!) 127/59  Pulse: (!) 53 (!) 58  Resp: 16 18  Temp: 98.1 F (36.7 C) 98.5 F (36.9 C)  SpO2: 97% 94%    General exam: Appears calm and comfortable  Respiratory system: Clear to auscultation save for rales at bases. Cardiovascular system: S1 & S2 heard, RRR. No JVD, murmurs, rubs, gallops or clicks.   Gastrointestinal system: Abdomen is obese, soft and nontender. No organomegaly or masses felt. Normal bowel sounds heard. Central nervous system: somnolent but rouses, oriented to  self and place Extremities: Symmetric 5 x 5 power. Skin: No rashes, lesions or ulcers Psychiatry: confused  Discharge Instructions   Discharge Instructions     Diet - low sodium heart healthy   Complete by: As directed    Increase activity slowly   Complete by: As directed    No wound care   Complete by: As directed       Allergies as of 11/28/2022   No Known  Allergies      Medication List     STOP taking these medications    ascorbic acid 500 MG tablet Commonly known as: VITAMIN C   co-enzyme Q-10 30 MG capsule   guaiFENesin-dextromethorphan 100-10 MG/5ML syrup Commonly known as: ROBITUSSIN DM   insulin glargine 100 UNIT/ML injection Commonly known as: LANTUS   predniSONE 20 MG tablet Commonly known as: DELTASONE   pyridoxine 100 MG tablet Commonly known as: B-6   sennosides-docusate sodium 8.6-50 MG tablet Commonly known as: SENOKOT-S   vitamin E 1000 UNIT capsule   zinc sulfate 220 (50 Zn) MG capsule       TAKE these medications    acetaminophen 500 MG tablet Commonly known as: TYLENOL Take 1,000 mg by mouth every 8 (eight) hours as needed.   amoxicillin-clavulanate 875-125 MG tablet Commonly known as: AUGMENTIN Take 1 tablet by mouth 2 (two) times daily.   apixaban 5 MG Tabs tablet Commonly known as: ELIQUIS Two tabs po twice a day for five days then one tab po twice a day afterwards   Baclofen 5 MG Tabs Take 1 tablet by mouth daily.   Biofreeze 4 % Gel Generic drug: Menthol (Topical Analgesic) Apply 1 Application topically 3 (three) times daily.   Calmoseptine 0.44-20.6 % Oint Generic drug: Menthol-Zinc Oxide Apply topically.   cyanocobalamin 1000 MCG tablet Take 1 tablet (1,000 mcg total) by mouth daily. What changed: how much to take   gabapentin 100 MG capsule Commonly known as: NEURONTIN Take 100 mg by mouth 2 (two) times daily.   HumaLOG KwikPen 100 UNIT/ML KwikPen Generic drug: insulin lispro Inject 2-8 Units into the skin 4 (four) times daily. Per sliding scale   hydrocortisone cream 1 % Apply 1 Application topically as needed.   Levemir FlexPen 100 UNIT/ML FlexPen Generic drug: insulin detemir Inject 12-15 Units into the skin 2 (two) times daily. 15 units qam 12 units qpm   levothyroxine 100 MCG tablet Commonly known as: SYNTHROID Take 100 mcg by mouth daily.   Lidocaine  Pain Relief 4 % Generic drug: lidocaine 1 patch daily.   multivitamin with minerals Tabs tablet Take 1 tablet by mouth daily.   tamsulosin 0.4 MG Caps capsule Commonly known as: FLOMAX Take 1 capsule by mouth daily.       No Known Allergies    The results of significant diagnostics from this hospitalization (including imaging, microbiology, ancillary and laboratory) are listed below for reference.    Significant Diagnostic Studies: CT CHEST ABDOMEN PELVIS W CONTRAST  Result Date: 11/26/2022 CLINICAL DATA:  Emesis and diarrhea. Exposure to gastroenteritis. Sepsis. EXAM: CT CHEST, ABDOMEN, AND PELVIS WITH CONTRAST TECHNIQUE: Multidetector CT imaging of the chest, abdomen and pelvis was performed following the standard protocol during bolus administration of intravenous contrast. RADIATION DOSE REDUCTION: This exam was performed according to the departmental dose-optimization program which includes automated exposure control, adjustment of the mA and/or kV according to patient size and/or use of iterative reconstruction technique. CONTRAST:  157m OMNIPAQUE IOHEXOL 300 MG/ML  SOLN COMPARISON:  Chest CTA 06/17/2020 FINDINGS: CT CHEST FINDINGS Cardiovascular: Normal heart size. No pericardial effusion. Extensive atheromatous calcification of the coronaries. No acute vascular finding Mediastinum/Nodes: No mass or adenopathy Lungs/Pleura: Patchy ground-glass opacity in the dependent lungs. The central airways are clear. No effusion or edema there is a subpleural scar-like opacity incited by the first costochondral junction on the left. Musculoskeletal: Generalized spondylosis with multi-level bridging osteophyte. CT ABDOMEN PELVIS FINDINGS Hepatobiliary: No focal liver abnormality.No evidence of biliary obstruction or stone. Pancreas: Generalized atrophy. Spleen: Unremarkable. Adrenals/Urinary Tract: Negative adrenals. No hydronephrosis or stone. Thick walled bladder with tiny dependent  calcification. Stomach/Bowel: No obstruction. No appendicitis. Hazy small bowel mesentery attributed to remote panniculitis, essentially stable. Separate 2.3 cm calcified mass beneath the hepatic flexure which is stable. No associated adenopathy. Vascular/Lymphatic: No acute vascular abnormality. Ordinary atheromatous change. No mass or adenopathy. Reproductive:No acute finding Other: No ascites or pneumoperitoneum. Musculoskeletal: Generalized spondylosis. IMPRESSION: 1. Patchy bilateral airspace disease, mainly dependent and likely from aspiration in this setting. 2. No acute intra-abdominal findings. Electronically Signed   By: Jorje Guild M.D.   On: 11/26/2022 04:37   DG Chest Portable 1 View  Result Date: 11/26/2022 CLINICAL DATA:  Fever EXAM: PORTABLE CHEST 1 VIEW COMPARISON:  07/31/2020 FINDINGS: Stable cardiomediastinal silhouette. No focal consolidation, pleural effusion, or pneumothorax. No acute osseous abnormality. IMPRESSION: No active disease. Electronically Signed   By: Placido Sou M.D.   On: 11/26/2022 01:46    Microbiology: Recent Results (from the past 240 hour(s))  Blood culture (routine x 2)     Status: None (Preliminary result)   Collection Time: 11/26/22  1:35 AM   Specimen: BLOOD  Result Value Ref Range Status   Specimen Description BLOOD LEFT FA  Final   Special Requests   Final    BOTTLES DRAWN AEROBIC AND ANAEROBIC Blood Culture results may not be optimal due to an inadequate volume of blood received in culture bottles   Culture   Final    NO GROWTH 2 DAYS Performed at [facility], [address], [place], [place] [address]    Report Status PENDING  Incomplete  Resp panel by RT-PCR (RSV, Flu A&B, Covid) Anterior Nasal Swab     Status: None   Collection Time: 11/26/22  1:46 AM   Specimen: Anterior Nasal Swab  Result Value Ref Range Status   SARS Coronavirus 2 by RT PCR NEGATIVE NEGATIVE Final    Comment: (NOTE) SARS-CoV-2 target nucleic acids are  NOT DETECTED.  The SARS-CoV-2 RNA is generally detectable in upper respiratory specimens during the acute phase of infection. The lowest concentration of SARS-CoV-2 viral copies this assay can detect is 138 copies/mL. A negative result does not preclude SARS-Cov-2 infection and should not be used as the sole basis for treatment or other patient management decisions. A negative result may occur with  improper specimen collection/handling, submission of specimen other than nasopharyngeal swab, presence of viral mutation(s) within the areas targeted by this assay, and inadequate number of viral copies(<138 copies/mL). A negative result must be combined with clinical observations, patient history, and epidemiological information. The expected result is Negative.  Fact Sheet for Patients:  EntrepreneurPulse.com.au  Fact Sheet for Healthcare Providers:  IncredibleEmployment.be  This test is no t yet approved or cleared by the [place] FDA and  has been authorized for detection and/or diagnosis of SARS-CoV-2 by FDA under an Emergency Use Authorization (EUA). This EUA will remain  in effect (meaning this test can be used) for the  duration of the COVID-19 declaration under Section 564(b)(1) of the Act, 21 U.S.C.section 360bbb-3(b)(1), unless the authorization is terminated  or revoked sooner.       Influenza A by PCR NEGATIVE NEGATIVE Final   Influenza B by PCR NEGATIVE NEGATIVE Final    Comment: (NOTE) The Xpert Xpress SARS-CoV-2/FLU/RSV plus assay is intended as an aid in the diagnosis of influenza from Nasopharyngeal swab specimens and should not be used as a sole basis for treatment. Nasal washings and aspirates are unacceptable for Xpert Xpress SARS-CoV-2/FLU/RSV testing.  Fact Sheet for Patients: EntrepreneurPulse.com.au  Fact Sheet for Healthcare Providers: IncredibleEmployment.be  This test is not  yet approved or cleared by the [place] FDA and has been authorized for detection and/or diagnosis of SARS-CoV-2 by FDA under an Emergency Use Authorization (EUA). This EUA will remain in effect (meaning this test can be used) for the duration of the COVID-19 declaration under Section 564(b)(1) of the Act, 21 U.S.C. section 360bbb-3(b)(1), unless the authorization is terminated or revoked.     Resp Syncytial Virus by PCR NEGATIVE NEGATIVE Final    Comment: (NOTE) Fact Sheet for Patients: EntrepreneurPulse.com.au  Fact Sheet for Healthcare Providers: IncredibleEmployment.be  This test is not yet approved or cleared by the [place] FDA and has been authorized for detection and/or diagnosis of SARS-CoV-2 by FDA under an Emergency Use Authorization (EUA). This EUA will remain in effect (meaning this test can be used) for the duration of the COVID-19 declaration under Section 564(b)(1) of the Act, 21 U.S.C. section 360bbb-3(b)(1), unless the authorization is terminated or revoked.  Performed at [facility], [place], [place] [address]   C Difficile Quick Screen w PCR reflex     Status: None   Collection Time: 11/26/22  2:10 AM   Specimen: STOOL  Result Value Ref Range Status   C Diff antigen NEGATIVE NEGATIVE Final   C Diff toxin NEGATIVE NEGATIVE Final   C Diff interpretation No C. difficile detected.  Final    Comment: Performed at [facility], [address] [place], [place] [address]  Gastrointestinal Panel by PCR , Stool     Status: None   Collection Time: 11/26/22  2:10 AM   Specimen: STOOL  Result Value Ref Range Status   Campylobacter species NOT DETECTED NOT DETECTED Final   Plesimonas shigelloides NOT DETECTED NOT DETECTED Final   Salmonella species NOT DETECTED NOT DETECTED Final   Yersinia enterocolitica NOT DETECTED NOT DETECTED Final   Vibrio species NOT DETECTED NOT DETECTED Final    Vibrio cholerae NOT DETECTED NOT DETECTED Final   Enteroaggregative E coli (EAEC) NOT DETECTED NOT DETECTED Final   Enteropathogenic E coli (EPEC) NOT DETECTED NOT DETECTED Final   Enterotoxigenic E coli (ETEC) NOT DETECTED NOT DETECTED Final   Shiga like toxin producing E coli (STEC) NOT DETECTED NOT DETECTED Final   Shigella/Enteroinvasive E coli (EIEC) NOT DETECTED NOT DETECTED Final   Cryptosporidium NOT DETECTED NOT DETECTED Final   Cyclospora cayetanensis NOT DETECTED NOT DETECTED Final   Entamoeba histolytica NOT DETECTED NOT DETECTED Final   Giardia lamblia NOT DETECTED NOT DETECTED Final   Adenovirus F40/41 NOT DETECTED NOT DETECTED Final   Astrovirus NOT DETECTED NOT DETECTED Final   Norovirus GI/GII NOT DETECTED NOT DETECTED Final   Rotavirus A NOT DETECTED NOT DETECTED Final   Sapovirus (I, II, IV, and V) NOT DETECTED NOT DETECTED Final    Comment: Performed at [facility], [address],  [place], [place] [address]  Blood culture (routine x 2)     Status: None (Preliminary result)   Collection Time: 11/26/22  2:21 AM   Specimen: BLOOD RIGHT ARM  Result Value Ref Range Status   Specimen Description BLOOD RIGHT ARM  Final   Special Requests   Final    BOTTLES DRAWN AEROBIC AND ANAEROBIC Blood Culture results may not be optimal due to an inadequate volume of blood received in culture bottles   Culture   Final    NO GROWTH 2 DAYS Performed at [facility], [address], [place], [place] [address]    Report Status PENDING  Incomplete  MRSA Next Gen by PCR, Nasal     Status: None   Collection Time: 11/26/22  8:34 AM   Specimen: Nasal Mucosa; Nasal Swab  Result Value Ref Range Status   MRSA by PCR Next Gen NOT DETECTED NOT DETECTED Final    Comment: (NOTE) The GeneXpert MRSA Assay (FDA approved for NASAL specimens only), is one component of a comprehensive MRSA colonization surveillance program. It is not intended to diagnose MRSA infection nor to  guide or monitor treatment for MRSA infections. Test performance is not FDA approved in patients less than 23 years old. Performed at [facility], [address] [place],  [address]      Labs: Basic Metabolic Panel: Recent Labs  Lab 11/26/22 0221 11/26/22 0834 11/26/22 1123 11/27/22 0438 11/28/22 0448  NA 139  --  136 137 135  K 4.4  --  5.0 4.2 3.7  CL 103  --  99 103 103  CO2 26  --  '26 25 25  '$ GLUCOSE 120*  --  168* 121* 125*  BUN 16  --  '17 22 22  '$ CREATININE 1.11 1.06 1.11 1.08 1.06  CALCIUM 9.2  --  8.8* 8.1* 7.7*   Liver Function Tests: Recent Labs  Lab 11/26/22 0221 11/26/22 1123 11/27/22 0438  AST 33 44* 36  ALT '16 17 16  '$ ALKPHOS 75 66 49  BILITOT 0.7 0.7 0.5  PROT 7.6 7.2 5.8*  ALBUMIN 3.3* 3.0* 2.6*   Recent Labs  Lab 11/26/22 0221  LIPASE 23   Recent Labs  Lab 11/26/22 0834  AMMONIA 51*   CBC: Recent Labs  Lab 11/26/22 0135 11/26/22 0834 11/27/22 0438 11/28/22 0448  WBC 12.7* 20.8* 11.7* 9.1  NEUTROABS 10.0*  --  8.6*  --   HGB 17.2* 14.9 13.3 11.9*  HCT 55.2* 47.1 40.6 37.2*  MCV 100.0 99.6 96.9 97.9  PLT 188 183 157 143*   Cardiac Enzymes: No results for input(s): "CKTOTAL", "CKMB", "CKMBINDEX", "TROPONINI" in the last 168 hours. BNP: BNP (last 3 results) No results for input(s): "BNP" in the last 8760 hours.  ProBNP (last 3 results) No results for input(s): "PROBNP" in the last 8760 hours.  CBG: Recent Labs  Lab 11/26/22 0956 11/26/22 1145 11/26/22 1722  GLUCAP 128* 144* 178*       Signed:  Desma Maxim MD.  [facility] 11/28/2022, 2:41 PM

## 2022-11-28 NOTE — Progress Notes (Signed)
Speech Language Pathology Treatment: Dysphagia  Patient Details Name: Cory [name] MRN: [id number] DOB: 1931-04-24 Today's Date: 11/28/2022 Time: 1230-1310 SLP Time Calculation (min) (ACUTE ONLY): 40 min  Assessment / Plan / Recommendation Clinical Impression  Pt seen for toleration of diet and trials to upgrade to thin liquids today. MD had already upgrade diet(foods) to mech soft s/p resolution of N/V. Pt remains on Nectar liquids for precautions until seen by SLP.  Pt awake, verbally responded to basic questions. Some Confusion noted in responses/orientation. Pt required full feeding assistance d/t UE weakness baseline and verbal cues for follow through w/ po tasks. On RA, afebrile. WBC WNL.     Pt appears to present w/ Functional oropharyngeal phase swallowing w/ No overt clinical s/s of aspiration noted during oral intake of soft solids and thin liquids, in light of declined Cognitive status and acute illness, including Esophageal deficits(N/V). ANY Cognitive decline/confusion can impact overall awareness/timing of swallow and safety during po tasks which increases risk for aspiration, choking. W/ any Esophageal phase Dysmotility, there is risk for aspiration of REFLUX material/Regurgitation. Esophageal phase Dysmotility can impact the pharyngeal phase of swallowing.  Pt's risk for aspiration can be reduced when following general aspiration precautions and when given support w/ feeding. Pt also requires verbal cues and monitoring during po tasks for follow through w/ precautions and reduce impulsive drinking/eating.   Pt consumed trials of ice chips, soft solids and Thin liquids w/ NO overt clinical s/s of aspiration noted: no decline in vocal quality, no cough, and no decline in respiratory status during/post trials. O2 sats remained 98%. Oral phase was adequate for bolus management, mastication, and oral clearing of the boluses given. Mastication of soft solids appropriate. Pt required feeding  support d/t weak UEs- baseline.       Recommend a dysphagia level 3(mech soft) w/ Thin liquids; Supervision. Recommend general aspiration precautions; reduce Distractions during meals and engage pt during po's at meal for help w/ self-feeding. Pills WHOLE vs Crushed in Puree for safer swallowing. Support w/ feeding at meals as needed. Reflux precautions. GI is following. GI/MD/NSG updated.    No further ST services indicated at this time as pt appears at/close to his Baseline. General precautions posted in room. Education w/ pt, NSG on general aspiration precautions w/ oral intake. Recommend Dietician f/u.      HPI HPI: Pt is a 89 y.o. male with medical history significant of type 2 diabetes, sleep apnea, presented with sepsis, vomiting and diarrhea w/ ? aspiration pneumonia s/p N/V, lethargy. Per report, patient arrived from rehab facility with persistent vomiting and diarrhea.  No reports of black/bloody or green emesis or diarrhea.  No reported fevers or chills.  Was unresponsive but is more alert and responsive at the time of this evaluation.  Per MD note/report, patient with generalized abdominal pain.  No reports of shortness of breath. Positive sick contacts and wife with similar GI symptoms for multiple days per chart note.  Unsure of pt's Baseline Cognitive status.      SLP Plan  All goals met      Recommendations for follow up therapy are one component of a multi-disciplinary discharge planning process, led by the attending physician.  Recommendations may be updated based on patient status, additional functional criteria and insurance authorization.    Recommendations  Diet recommendations: Dysphagia 3 (mechanical soft);Thin liquid Liquids provided via: Cup;Straw (monitor) Medication Administration: Whole meds with puree (vs need to Crush in puree) Supervision: Staff to assist with self  feeding;Full supervision/cueing for compensatory strategies Compensations: Minimize  environmental distractions;Slow rate;Small sips/bites;Lingual sweep for clearance of pocketing;Follow solids with liquid Postural Changes and/or Swallow Maneuvers: Out of bed for meals;Seated upright 90 degrees;Upright 30-60 min after meal (REFLUX precs)                General recommendations:  (Dietician) Oral Care Recommendations: Oral care BID;Oral care before and after PO;Staff/trained caregiver to provide oral care Follow Up Recommendations: No SLP follow up Assistance recommended at discharge: Frequent or constant Supervision/Assistance (feeding) SLP Visit Diagnosis: Dysphagia, unspecified (R13.10) Plan: All goals met             Orinda Kenner, MS, [place]; [place] [phone] (ascom) [name]  11/28/2022, 3:59 PM

## 2022-11-28 NOTE — Progress Notes (Signed)
Report called to SNF

## 2022-11-28 NOTE — TOC Transition Note (Signed)
Transition of Care [facility]) - CM/SW Discharge Note   Patient Details  Name: Cory [name] MRN: [id number] Date of Birth: 1931-02-02  [facility] of [facility]) CM/SW Contact:  Gerilyn Pilgrim, LCSW Phone Number: 11/28/2022, 2:48 PM   Clinical Narrative:   Pt has orders to discharge to [facility]. Medical necessity printed. Pt 6th in line for [facility]. [facility] notified. DC summary sent. CSW signing off.           Patient Goals and CMS Choice      Discharge Placement                         Discharge Plan and Services Additional resources added to the After Visit Summary for                                       Social Determinants of Health (SDOH) Interventions SDOH Screenings   Tobacco Use: Low Risk  (11/26/2022)     Readmission Risk Interventions     No data to display

## 2022-11-28 NOTE — NC FL2 (Signed)
[place] LEVEL OF CARE FORM     IDENTIFICATION  Patient Name: Cory [name] Birthdate: 05-30-1931 Sex: male Admission Date (Current Location): 11/26/2022  [facility] and [place] Number:  Engineering geologist and Address:  [facility], [address], [place], [place] [address]      Provider Number: [id number]  Attending Physician Name and Address:  Gwynne Edinger, MD  Relative Name and Phone Number:  Catalina Pizza (Daughter) [phone]    Current Level of Care: Hospital Recommended Level of Care: [place] Prior Approval Number:    Date Approved/Denied:   PASRR Number:    Discharge Plan:      Current Diagnoses: Patient Active Problem List   Diagnosis Date Noted   Pressure injury of skin 11/27/2022   Dementia ([place]) 11/27/2022   Sepsis due to undetermined organism ([place]) 11/26/2022   Lactic acidosis 11/26/2022   Aspiration pneumonia ([place]) 11/26/2022   Vomiting and diarrhea 11/26/2022   Pulmonary embolus (HCC)    Hyponatremia    Hyperkalemia    Dysphagia    Acute respiratory failure with hypoxia (HCC)    Generalized weakness    Lethargy    Acute kidney injury superimposed on CKD (HCC)    AF (paroxysmal atrial fibrillation) ([place])    Type 2 diabetes mellitus with hyperlipidemia ([place])    Acute respiratory failure due to COVID-19 ([place]) 06/13/2020   Pneumonia due to COVID-19 virus 06/12/2020   Hyperosmolar hyperglycemic state (HHS) ([place]) 06/12/2020   Intractable pain 10/17/2016   Obstructive sleep apnea on CPAP 07/29/2016   Hypothyroidism due to acquired atrophy of thyroid 01/23/2016   CKD (chronic kidney disease), stage III ([place]) 03/13/2014    Orientation RESPIRATION BLADDER Height & Weight     Self    Incontinent, External catheter Weight: 171 lb 4.8 oz (77.7 kg) Height:  '5\' 8"'$  (172.7 cm)  BEHAVIORAL SYMPTOMS/MOOD NEUROLOGICAL BOWEL NUTRITION STATUS      Continent Diet (thin liquids mech soft with thins  and general aspirations precautions including pills with puree)  AMBULATORY STATUS COMMUNICATION OF NEEDS Skin   Extensive Assist Verbally  (Redness scrotum, stage 1 buttocks nonblanchable)                       Personal Care Assistance Level of Assistance  Bathing, Feeding, Dressing Bathing Assistance: Maximum assistance Feeding assistance: Limited assistance Dressing Assistance: Maximum assistance Total Care Assistance: Maximum assistance   Functional Limitations Info             SPECIAL CARE FACTORS FREQUENCY                       Contractures Contractures Info: Not present    Additional Factors Info  Code Status Code Status Info: DNR             Current Medications (11/28/2022):  This is the current hospital active medication list Current Facility-Administered Medications  Medication Dose Route Frequency Provider Last Rate Last Admin   0.9 %  sodium chloride infusion   Intravenous PRN Deneise Lever, MD   Stopped at 11/27/22 0640   0.9 %  sodium chloride infusion   Intravenous Continuous Wouk, Ailene Rud, MD 100 mL/hr at 11/27/22 2147 New Bag at 11/27/22 2147   acetaminophen (TYLENOL) tablet 650 mg  650 mg Oral Q6H PRN Deneise Lever, MD       Ampicillin-Sulbactam (UNASYN) 3 g in sodium chloride 0.9 % 100 mL  IVPB  3 g Intravenous Q6H Berton Mount, RPH 200 mL/hr at 11/28/22 1124 3 g at 11/28/22 1124   apixaban (ELIQUIS) tablet 5 mg  5 mg Oral BID Lorin Picket, RPH   5 mg at 11/28/22 [id number]   gabapentin (NEURONTIN) capsule 100 mg  100 mg Oral BID Gwynne Edinger, MD   100 mg at 11/28/22 [id number]   levothyroxine (SYNTHROID) tablet 100 mcg  100 mcg Oral Q0600 Gwynne Edinger, MD   100 mcg at 11/28/22 0618   ondansetron [facility]) tablet 4 mg  4 mg Oral Q6H PRN Deneise Lever, MD       Or   ondansetron [facility] injection 4 mg  4 mg Intravenous Q6H PRN Deneise Lever, MD       tamsulosin [facility]) capsule 0.4 mg  0.4 mg Oral Daily Gwynne Edinger, MD   0.4 mg at 11/28/22 [id number]     Discharge Medications: Please see discharge summary for a list of discharge medications.  Relevant Imaging Results:  Relevant Lab Results:   Additional Information SS# [id number]  Gerilyn Pilgrim, LCSW

## 2022-12-01 LAB — CULTURE, BLOOD (ROUTINE X 2)
Culture: NO GROWTH
Culture: NO GROWTH

## 2022-12-07 ENCOUNTER — Emergency Department: Payer: Medicare Other

## 2022-12-07 ENCOUNTER — Other Ambulatory Visit: Payer: Self-pay

## 2022-12-07 ENCOUNTER — Encounter: Payer: Self-pay | Admitting: Internal Medicine

## 2022-12-07 ENCOUNTER — Inpatient Hospital Stay: Payer: Medicare Other

## 2022-12-07 ENCOUNTER — Inpatient Hospital Stay
Admission: EM | Admit: 2022-12-07 | Discharge: 2022-12-10 | DRG: 871 | Disposition: A | Discharge status: Skilled Nursing Facility | Payer: Medicare Other | Source: Skilled Nursing Facility | Attending: Hospitalist | Admitting: Hospitalist

## 2022-12-07 DIAGNOSIS — Z79899 Other long term (current) drug therapy: Secondary | ICD-10-CM

## 2022-12-07 DIAGNOSIS — Z79890 Hormone replacement therapy (postmenopausal): Secondary | ICD-10-CM

## 2022-12-07 DIAGNOSIS — K529 Noninfective gastroenteritis and colitis, unspecified: Secondary | ICD-10-CM | POA: Diagnosis not present

## 2022-12-07 DIAGNOSIS — A419 Sepsis, unspecified organism: Secondary | ICD-10-CM | POA: Diagnosis not present

## 2022-12-07 DIAGNOSIS — I480 Paroxysmal atrial fibrillation: Secondary | ICD-10-CM | POA: Diagnosis present

## 2022-12-07 DIAGNOSIS — Z7401 Bed confinement status: Secondary | ICD-10-CM | POA: Diagnosis not present

## 2022-12-07 DIAGNOSIS — I700 Atherosclerosis of aorta: Secondary | ICD-10-CM | POA: Diagnosis present

## 2022-12-07 DIAGNOSIS — A0472 Enterocolitis due to Clostridium difficile, not specified as recurrent: Secondary | ICD-10-CM | POA: Diagnosis present

## 2022-12-07 DIAGNOSIS — J9601 Acute respiratory failure with hypoxia: Secondary | ICD-10-CM | POA: Diagnosis present

## 2022-12-07 DIAGNOSIS — E662 Morbid (severe) obesity with alveolar hypoventilation: Secondary | ICD-10-CM | POA: Diagnosis present

## 2022-12-07 DIAGNOSIS — N1831 Chronic kidney disease, stage 3a: Secondary | ICD-10-CM | POA: Diagnosis present

## 2022-12-07 DIAGNOSIS — G9341 Metabolic encephalopathy: Secondary | ICD-10-CM | POA: Diagnosis present

## 2022-12-07 DIAGNOSIS — B356 Tinea cruris: Secondary | ICD-10-CM | POA: Diagnosis present

## 2022-12-07 DIAGNOSIS — Z6833 Body mass index (BMI) 33.0-33.9, adult: Secondary | ICD-10-CM

## 2022-12-07 DIAGNOSIS — R0902 Hypoxemia: Secondary | ICD-10-CM | POA: Diagnosis present

## 2022-12-07 DIAGNOSIS — Z515 Encounter for palliative care: Secondary | ICD-10-CM

## 2022-12-07 DIAGNOSIS — E1122 Type 2 diabetes mellitus with diabetic chronic kidney disease: Secondary | ICD-10-CM | POA: Diagnosis present

## 2022-12-07 DIAGNOSIS — J690 Pneumonitis due to inhalation of food and vomit: Secondary | ICD-10-CM

## 2022-12-07 DIAGNOSIS — R6520 Severe sepsis without septic shock: Secondary | ICD-10-CM

## 2022-12-07 DIAGNOSIS — E871 Hypo-osmolality and hyponatremia: Secondary | ICD-10-CM | POA: Diagnosis present

## 2022-12-07 DIAGNOSIS — I482 Chronic atrial fibrillation, unspecified: Secondary | ICD-10-CM | POA: Diagnosis present

## 2022-12-07 DIAGNOSIS — N183 Chronic kidney disease, stage 3 unspecified: Secondary | ICD-10-CM | POA: Diagnosis present

## 2022-12-07 DIAGNOSIS — A411 Sepsis due to other specified staphylococcus: Principal | ICD-10-CM | POA: Diagnosis present

## 2022-12-07 DIAGNOSIS — G4733 Obstructive sleep apnea (adult) (pediatric): Secondary | ICD-10-CM | POA: Diagnosis not present

## 2022-12-07 DIAGNOSIS — E669 Obesity, unspecified: Secondary | ICD-10-CM | POA: Insufficient documentation

## 2022-12-07 DIAGNOSIS — Z1152 Encounter for screening for COVID-19: Secondary | ICD-10-CM

## 2022-12-07 DIAGNOSIS — Z7189 Other specified counseling: Secondary | ICD-10-CM | POA: Diagnosis not present

## 2022-12-07 DIAGNOSIS — F0390 Unspecified dementia without behavioral disturbance: Secondary | ICD-10-CM | POA: Diagnosis present

## 2022-12-07 DIAGNOSIS — E785 Hyperlipidemia, unspecified: Secondary | ICD-10-CM | POA: Diagnosis present

## 2022-12-07 DIAGNOSIS — E034 Atrophy of thyroid (acquired): Secondary | ICD-10-CM | POA: Diagnosis present

## 2022-12-07 DIAGNOSIS — Z794 Long term (current) use of insulin: Secondary | ICD-10-CM

## 2022-12-07 DIAGNOSIS — Z66 Do not resuscitate: Secondary | ICD-10-CM | POA: Diagnosis present

## 2022-12-07 DIAGNOSIS — Z7901 Long term (current) use of anticoagulants: Secondary | ICD-10-CM

## 2022-12-07 DIAGNOSIS — I2699 Other pulmonary embolism without acute cor pulmonale: Secondary | ICD-10-CM | POA: Diagnosis present

## 2022-12-07 DIAGNOSIS — E1169 Type 2 diabetes mellitus with other specified complication: Secondary | ICD-10-CM | POA: Diagnosis present

## 2022-12-07 DIAGNOSIS — N400 Enlarged prostate without lower urinary tract symptoms: Secondary | ICD-10-CM | POA: Diagnosis present

## 2022-12-07 DIAGNOSIS — Z86711 Personal history of pulmonary embolism: Secondary | ICD-10-CM

## 2022-12-07 HISTORY — DX: Unspecified dementia, unspecified severity, without behavioral disturbance, psychotic disturbance, mood disturbance, and anxiety: F03.90

## 2022-12-07 HISTORY — DX: Chronic kidney disease, stage 3a: N18.31

## 2022-12-07 HISTORY — DX: Other pulmonary embolism without acute cor pulmonale: I26.99

## 2022-12-07 HISTORY — DX: Benign prostatic hyperplasia without lower urinary tract symptoms: N40.0

## 2022-12-07 HISTORY — DX: Hypothyroidism, unspecified: E03.9

## 2022-12-07 HISTORY — DX: Chronic atrial fibrillation, unspecified: I48.20

## 2022-12-07 LAB — CBC WITH DIFFERENTIAL/PLATELET
Abs Immature Granulocytes: 0.12 10*3/uL — ABNORMAL HIGH (ref 0.00–0.07)
Basophils Absolute: 0.1 10*3/uL (ref 0.0–0.1)
Basophils Relative: 0 %
Eosinophils Absolute: 0.2 10*3/uL (ref 0.0–0.5)
Eosinophils Relative: 1 %
HCT: 42.7 % (ref 39.0–52.0)
Hemoglobin: 13.9 g/dL (ref 13.0–17.0)
Immature Granulocytes: 1 %
Lymphocytes Relative: 13 %
Lymphs Abs: 2.7 10*3/uL (ref 0.7–4.0)
MCH: 31.1 pg (ref 26.0–34.0)
MCHC: 32.6 g/dL (ref 30.0–36.0)
MCV: 95.5 fL (ref 80.0–100.0)
Monocytes Absolute: 1.2 10*3/uL — ABNORMAL HIGH (ref 0.1–1.0)
Monocytes Relative: 6 %
Neutro Abs: 17.1 10*3/uL — ABNORMAL HIGH (ref 1.7–7.7)
Neutrophils Relative %: 79 %
Platelets: 348 10*3/uL (ref 150–400)
RBC: 4.47 MIL/uL (ref 4.22–5.81)
RDW: 13.5 % (ref 11.5–15.5)
WBC: 21.4 10*3/uL — ABNORMAL HIGH (ref 4.0–10.5)
nRBC: 0 % (ref 0.0–0.2)

## 2022-12-07 LAB — STREP PNEUMONIAE URINARY ANTIGEN: Strep Pneumo Urinary Antigen: NEGATIVE

## 2022-12-07 LAB — PROCALCITONIN: Procalcitonin: 0.22 ng/mL

## 2022-12-07 LAB — URINALYSIS, W/ REFLEX TO CULTURE (INFECTION SUSPECTED)
Bilirubin Urine: NEGATIVE
Glucose, UA: NEGATIVE mg/dL
Hgb urine dipstick: NEGATIVE
Ketones, ur: 5 mg/dL — AB
Nitrite: NEGATIVE
Protein, ur: NEGATIVE mg/dL
Specific Gravity, Urine: 1.014 (ref 1.005–1.030)
pH: 6 (ref 5.0–8.0)

## 2022-12-07 LAB — COMPREHENSIVE METABOLIC PANEL
ALT: 18 U/L (ref 0–44)
AST: 36 U/L (ref 15–41)
Albumin: 2.8 g/dL — ABNORMAL LOW (ref 3.5–5.0)
Alkaline Phosphatase: 65 U/L (ref 38–126)
Anion gap: 9 (ref 5–15)
BUN: 7 mg/dL — ABNORMAL LOW (ref 8–23)
CO2: 29 mmol/L (ref 22–32)
Calcium: 8.7 mg/dL — ABNORMAL LOW (ref 8.9–10.3)
Chloride: 91 mmol/L — ABNORMAL LOW (ref 98–111)
Creatinine, Ser: 1.15 mg/dL (ref 0.61–1.24)
GFR, Estimated: >60 mL/min (ref >60)
Glucose, Bld: 164 mg/dL — ABNORMAL HIGH (ref 70–99)
Potassium: 4.4 mmol/L (ref 3.5–5.1)
Sodium: 129 mmol/L — ABNORMAL LOW (ref 135–145)
Total Bilirubin: 1.3 mg/dL — ABNORMAL HIGH (ref 0.3–1.2)
Total Protein: 7.1 g/dL (ref 6.5–8.1)

## 2022-12-07 LAB — RESP PANEL BY RT-PCR (RSV, FLU A&B, COVID)  RVPGX2
Influenza A by PCR: NEGATIVE
Influenza B by PCR: NEGATIVE
Resp Syncytial Virus by PCR: NEGATIVE
SARS Coronavirus 2 by RT PCR: NEGATIVE

## 2022-12-07 LAB — LACTIC ACID, PLASMA
Lactic Acid, Venous: 2.7 mmol/L (ref 0.5–1.9)
Lactic Acid, Venous: 2.7 mmol/L (ref 0.5–1.9)
Lactic Acid, Venous: 2.5 mmol/L (ref 0.5–1.9)

## 2022-12-07 LAB — PROTIME-INR
INR: 1.8 — ABNORMAL HIGH (ref 0.8–1.2)
Prothrombin Time: 20.4 seconds — ABNORMAL HIGH (ref 11.4–15.2)

## 2022-12-07 LAB — CBG MONITORING, ED
Glucose-Capillary: 150 mg/dL — ABNORMAL HIGH (ref 70–99)
Glucose-Capillary: 126 mg/dL — ABNORMAL HIGH (ref 70–99)

## 2022-12-07 LAB — APTT: aPTT: 50 seconds — ABNORMAL HIGH (ref 24–36)

## 2022-12-07 MED ORDER — ACETAMINOPHEN 325 MG PO TABS: 650.0000 mg | ORAL_TABLET | Freq: Four times a day (QID) | ORAL | Status: DC | PRN

## 2022-12-07 MED ORDER — INSULIN ASPART 100 UNIT/ML IJ SOLN
0.0000 [IU] | Freq: Every day | INTRAMUSCULAR | Status: DC
  Administered 2022-12-09: 2 [IU] via SUBCUTANEOUS
  Filled 2022-12-07: qty 1

## 2022-12-07 MED ORDER — SODIUM CHLORIDE 0.9 % IV BOLUS
1000.0000 mL | Freq: Once | INTRAVENOUS | Status: AC
  Administered 2022-12-07: 1000 mL via INTRAVENOUS

## 2022-12-07 MED ORDER — IPRATROPIUM-ALBUTEROL 0.5-2.5 (3) MG/3ML IN SOLN
3.0000 mL | Freq: Four times a day (QID) | RESPIRATORY_TRACT | Status: DC
  Administered 2022-12-07 – 2022-12-09 (×7): 3 mL via RESPIRATORY_TRACT
  Filled 2022-12-07 (×7): qty 3

## 2022-12-07 MED ORDER — ALBUTEROL SULFATE (2.5 MG/3ML) 0.083% IN NEBU: 2.5000 mg | INHALATION_SOLUTION | RESPIRATORY_TRACT | Status: DC | PRN

## 2022-12-07 MED ORDER — LEVOTHYROXINE SODIUM 100 MCG PO TABS
100.0000 ug | ORAL_TABLET | Freq: Every day | ORAL | Status: DC
  Administered 2022-12-09 – 2022-12-10 (×2): 100 ug via ORAL
  Filled 2022-12-07 (×2): qty 1

## 2022-12-07 MED ORDER — LIDOCAINE 5 % EX PTCH
1.0000 | MEDICATED_PATCH | Freq: Every day | CUTANEOUS | Status: DC
  Filled 2022-12-07 (×2): qty 1

## 2022-12-07 MED ORDER — SODIUM CHLORIDE 0.9 % IV SOLN
500.0000 mg | Freq: Once | INTRAVENOUS | Status: AC
  Administered 2022-12-07: 500 mg via INTRAVENOUS
  Filled 2022-12-07: qty 5

## 2022-12-07 MED ORDER — BACLOFEN 10 MG PO TABS
5.0000 mg | ORAL_TABLET | Freq: Every day | ORAL | Status: DC
  Administered 2022-12-08 – 2022-12-09 (×2): 5 mg via ORAL
  Filled 2022-12-07 (×2): qty 1

## 2022-12-07 MED ORDER — VANCOMYCIN HCL 1750 MG/350ML IV SOLN
1750.0000 mg | Freq: Once | INTRAVENOUS | Status: AC
  Administered 2022-12-07: 1750 mg via INTRAVENOUS
  Filled 2022-12-07 (×2): qty 350

## 2022-12-07 MED ORDER — INSULIN ASPART 100 UNIT/ML IJ SOLN
0.0000 [IU] | Freq: Three times a day (TID) | INTRAMUSCULAR | Status: DC
  Administered 2022-12-07 – 2022-12-09 (×2): 1 [IU] via SUBCUTANEOUS
  Filled 2022-12-07 (×3): qty 1

## 2022-12-07 MED ORDER — DM-GUAIFENESIN ER 30-600 MG PO TB12: 1.0000 | ORAL_TABLET | Freq: Two times a day (BID) | ORAL | Status: DC | PRN

## 2022-12-07 MED ORDER — ONDANSETRON HCL 4 MG/2ML IJ SOLN: 4.0000 mg | Freq: Three times a day (TID) | INTRAMUSCULAR | Status: DC | PRN

## 2022-12-07 MED ORDER — SODIUM CHLORIDE 0.9 % IV SOLN
3.0000 g | Freq: Once | INTRAVENOUS | Status: AC
  Administered 2022-12-07: 3 g via INTRAVENOUS
  Filled 2022-12-07: qty 8

## 2022-12-07 MED ORDER — GABAPENTIN 100 MG PO CAPS
100.0000 mg | ORAL_CAPSULE | Freq: Two times a day (BID) | ORAL | Status: DC
  Administered 2022-12-08 – 2022-12-10 (×4): 100 mg via ORAL
  Filled 2022-12-07 (×5): qty 1

## 2022-12-07 MED ORDER — SODIUM CHLORIDE 0.9 % IV SOLN: INTRAVENOUS | Status: DC

## 2022-12-07 MED ORDER — SODIUM CHLORIDE 1 G PO TABS
1.0000 g | ORAL_TABLET | Freq: Two times a day (BID) | ORAL | Status: DC
  Filled 2022-12-07 (×2): qty 1

## 2022-12-07 MED ORDER — IOHEXOL 300 MG/ML  SOLN
100.0000 mL | Freq: Once | INTRAMUSCULAR | Status: AC | PRN
  Administered 2022-12-07: 100 mL via INTRAVENOUS

## 2022-12-07 MED ORDER — SODIUM CHLORIDE 0.9 % IV BOLUS
500.0000 mL | Freq: Once | INTRAVENOUS | Status: AC
  Administered 2022-12-07: 500 mL via INTRAVENOUS

## 2022-12-07 MED ORDER — ADULT MULTIVITAMIN W/MINERALS CH
1.0000 | ORAL_TABLET | Freq: Every day | ORAL | Status: DC
  Administered 2022-12-09 – 2022-12-10 (×2): 1 via ORAL
  Filled 2022-12-07 (×3): qty 1

## 2022-12-07 MED ORDER — VANCOMYCIN HCL 1750 MG/350ML IV SOLN: 1750.0000 mg | INTRAVENOUS | Status: DC

## 2022-12-07 MED ORDER — METRONIDAZOLE 500 MG/100ML IV SOLN
500.0000 mg | Freq: Two times a day (BID) | INTRAVENOUS | Status: DC
  Administered 2022-12-08 – 2022-12-09 (×3): 500 mg via INTRAVENOUS
  Filled 2022-12-07 (×4): qty 100

## 2022-12-07 MED ORDER — INSULIN GLARGINE-YFGN 100 UNIT/ML ~~LOC~~ SOLN
5.0000 [IU] | Freq: Two times a day (BID) | SUBCUTANEOUS | Status: DC
  Administered 2022-12-07 – 2022-12-09 (×4): 5 [IU] via SUBCUTANEOUS
  Filled 2022-12-07 (×7): qty 0.05

## 2022-12-07 MED ORDER — METRONIDAZOLE 500 MG/100ML IV SOLN
500.0000 mg | Freq: Once | INTRAVENOUS | Status: AC
  Administered 2022-12-07: 500 mg via INTRAVENOUS
  Filled 2022-12-07: qty 100

## 2022-12-07 MED ORDER — SODIUM CHLORIDE 0.9 % IV SOLN
2.0000 g | Freq: Two times a day (BID) | INTRAVENOUS | Status: DC
  Administered 2022-12-08 – 2022-12-09 (×3): 2 g via INTRAVENOUS
  Filled 2022-12-07: qty 12.5
  Filled 2022-12-07: qty 2
  Filled 2022-12-07 (×2): qty 12.5

## 2022-12-07 MED ORDER — MUSCLE RUB 10-15 % EX CREA: 1.0000 | TOPICAL_CREAM | Freq: Three times a day (TID) | CUTANEOUS | Status: DC | PRN

## 2022-12-07 MED ORDER — ACETAMINOPHEN 650 MG RE SUPP: 650.0000 mg | Freq: Four times a day (QID) | RECTAL | Status: DC | PRN

## 2022-12-07 MED ORDER — SODIUM CHLORIDE 0.9 % IV SOLN
2.0000 g | Freq: Once | INTRAVENOUS | Status: AC
  Administered 2022-12-07: 2 g via INTRAVENOUS
  Filled 2022-12-07: qty 12.5

## 2022-12-07 MED ORDER — APIXABAN 5 MG PO TABS
5.0000 mg | ORAL_TABLET | Freq: Two times a day (BID) | ORAL | Status: DC
  Administered 2022-12-08 – 2022-12-10 (×4): 5 mg via ORAL
  Filled 2022-12-07 (×5): qty 1

## 2022-12-07 MED ORDER — VITAMIN B-12 1000 MCG PO TABS
1000.0000 ug | ORAL_TABLET | Freq: Every day | ORAL | Status: DC
  Administered 2022-12-09 – 2022-12-10 (×2): 1000 ug via ORAL
  Filled 2022-12-07: qty 1
  Filled 2022-12-07: qty 2
  Filled 2022-12-07: qty 1

## 2022-12-07 NOTE — ED Notes (Signed)
Patient transported to CT 

## 2022-12-07 NOTE — H&P (Signed)
History and Physical    Cory [name] [id number] DOB: Feb 11, 1931 DOA: 12/07/2022  Referring MD/NP/PA:   PCP: Madelyn Brunner, MD   Patient coming from:  The patient is coming from SNF  Chief Complaint: SOB and diarrhea  HPI: Cory [name] is a 89 y.o. male with medical history significant of DM, hypothyroidism, dementia, bed bound, CKD-3a, A fib and PE on Eliquis, BPH, aspiration pneumonia, who presents with shortness of breath and diarrhea.  Patient has worsening mental status, cannot provide any medical history.  Wife is at the bedside, but his wife seem to be intermittently confused during the interview. Per her stepdaughter (I called her stepdaughter by phone), patient's wife has dementia with intermittent confusion. Per his stepdaughter, patient is bedbound, and minimally verbal at normal baseline.  Recently patient has been declining.  Patient is normally oriented to person and place, but not to the time. Today when I saw pt in ED, pt is altered, barely arousable, not following command, not oriented x 3.  Patient does not move his legs which is normal to him.  He moves both arms.  No facial droop.   Patient was recently hospitalized from 2/28 - 3/1 due to gastroenteritis and aspiration pneumonia.  Patient had negative C diff and GI pathogen panel in previous admission. Pt was discharged on Augmentin.  Per report, patient continues to have shortness of breath, with oxygen desaturating to 85% on room air, which improved to 92-96% on 3 L oxygen.  Patient normally is not using oxygen per report.  Patient has cough, no fever, respiratory distress.  Patient continues to have diarrhea, no active nausea and vomiting, not sure if patient has abdominal pain.  Not sure if patient has symptoms of UTI.  History is very limited.  Data reviewed independently and ED Course: pt was found to have WBC 21.4, lactic acid 2.7, 2.7 INR 1.8, PTT 50, negative PCR for COVID, flu and RSV, GFR> 60, sodium 129.   Temperature is normal, initial blood pressure 88/58, which improved to 102/78 after giving 2 L normal saline bolus, heart rate 74, 115, RR 26.   Chest x-ray showed low volume. Patient is admitted to PCU as inpatient.  CT-Chest/abd/pelvis: 1. New diffuse colonic and rectal wall thickening compatible with colitis (including possibly C. difficile colitis) 2. New small bilateral pleural effusions. 3. Suspected patchy ground-glass opacities in the lungs, however assessment is severely limited by motion. 4.  Aortic Atherosclerosis (ICD10-I70.0).  EKG: Not done in ED, will get one.     Review of Systems: Could not be reviewed due to dementia, minimally verbal and altered mental status   Allergy: No Known Allergies  Past Medical History:  Diagnosis Date   BPH (benign prostatic hyperplasia)    Chronic atrial fibrillation (HCC)    Chronic kidney disease, stage 3a (HCC)    Dementia (HCC)    Diabetes mellitus without complication (HCC)    Hypothyroidism    OSA (obstructive sleep apnea)    Pulmonary embolism (HCC)    Thyroid disease     History reviewed. No pertinent surgical history. Could not be reviewed due to dementia and altered mental status  Social History:  reports that he has never smoked. He has never used smokeless tobacco. He reports current alcohol use. He reports that he does not use drugs.  Family History: Could not be reviewed due to dementia and altered mental status  Prior to Admission medications   Medication Sig Start Date End Date  Taking? Authorizing Provider  acetaminophen (TYLENOL) 500 MG tablet Take 1,000 mg by mouth every 8 (eight) hours as needed.    [provider]  amoxicillin-clavulanate (AUGMENTIN) 875-125 MG tablet Take 1 tablet by mouth 2 (two) times daily. 11/28/22   Wouk, Ailene Rud, MD  apixaban (ELIQUIS) 5 MG TABS tablet Two tabs po twice a day for five days then one tab po twice a day afterwards 06/19/20   Loletha Grayer, MD  Baclofen 5 MG  TABS Take 1 tablet by mouth daily. 11/03/22   [provider]  cyanocobalamin 1000 MCG tablet Take 1 tablet (1,000 mcg total) by mouth daily. 11/28/22   Wouk, Ailene Rud, MD  gabapentin (NEURONTIN) 100 MG capsule Take 100 mg by mouth 2 (two) times daily. 11/21/22   [provider]  HUMALOG KWIKPEN 100 UNIT/ML KwikPen Inject 2-8 Units into the skin 4 (four) times daily. Per sliding scale 11/03/22   [provider]  hydrocortisone cream 1 % Apply 1 Application topically as needed. 11/03/22   [provider]  LEVEMIR FLEXPEN 100 UNIT/ML FlexPen Inject 12-15 Units into the skin 2 (two) times daily. 15 units qam 12 units qpm 11/08/22   [provider]  levothyroxine (SYNTHROID) 100 MCG tablet Take 100 mcg by mouth daily. 10/17/22   [provider]  LIDOCAINE PAIN RELIEF 4 % 1 patch daily. 07/11/22   [provider]  Menthol, Topical Analgesic, (BIOFREEZE) 4 % GEL Apply 1 Application topically 3 (three) times daily.    [provider]  Menthol-Zinc Oxide (CALMOSEPTINE) 0.44-20.6 % OINT Apply topically.    [provider]  Multiple Vitamin (MULTIVITAMIN WITH MINERALS) TABS tablet Take 1 tablet by mouth daily.    [provider]  tamsulosin (FLOMAX) 0.4 MG CAPS capsule Take 1 capsule by mouth daily. 07/29/16   [provider]    Physical Exam: Vitals:   12/07/22 1729 12/07/22 1740 12/07/22 1800 12/07/22 1815  BP:  (!) 105/50 (!) 92/39 119/69  Pulse:   94 84  Resp:   (!) 23 (!) 27  Temp:      TempSrc:      SpO2:   92% 100%  Weight: 99.3 kg      General: Not in acute distress HEENT:       Eyes: PERRL, EOMI, no scleral icterus.       ENT: No discharge from the ears and nose       Neck: No JVD, no bruit, no mass felt. Heme: No neck lymph node enlargement. Cardiac: S1/S2, RRR, No murmurs, No gallops or rubs. Respiratory: has coarse breathing sound bilaterally GI: Soft, nondistended, nontender, no  organomegaly, BS present. GU: No hematuria Ext: No pitting leg edema bilaterally. 1+DP/PT pulse bilaterally. Musculoskeletal: No joint deformities, No joint redness or warmth, no limitation of ROM in spin. Skin: No rashes.  Neuro: Pt is altered, barely arousable, not following command, not oriented x 3. Not move his legs, moves both arms.  No facial droop. Cranial nerves II-XII grossly intact,  Psych: Patient is not psychotic, no suicidal or hemocidal ideation.  Labs on Admission: I have personally reviewed following labs and imaging studies  CBC: Recent Labs  Lab 12/07/22 1149  WBC 21.4*  NEUTROABS 17.1*  HGB 13.9  HCT 42.7  MCV 95.5  PLT 0000000   Basic Metabolic Panel: Recent Labs  Lab 12/07/22 1149  NA 129*  K 4.4  CL 91*  CO2 29  GLUCOSE 164*  BUN 7*  CREATININE 1.15  CALCIUM 8.7*   GFR: Estimated Creatinine Clearance: 49.8 mL/min (by C-G formula based on SCr of 1.15 mg/dL). Liver Function Tests: Recent Labs  Lab 12/07/22 1149  AST 36  ALT 18  ALKPHOS 65  BILITOT 1.3*  PROT 7.1  ALBUMIN 2.8*   No results for input(s): "LIPASE", "AMYLASE" in the last 168 hours. No results for input(s): "AMMONIA" in the last 168 hours. Coagulation Profile: Recent Labs  Lab 12/07/22 1149  INR 1.8*   Cardiac Enzymes: No results for input(s): "CKTOTAL", "CKMB", "CKMBINDEX", "TROPONINI" in the last 168 hours. BNP (last 3 results) No results for input(s): "PROBNP" in the last 8760 hours. HbA1C: No results for input(s): "HGBA1C" in the last 72 hours. CBG: Recent Labs  Lab 12/07/22 1744  GLUCAP 150*   Lipid Profile: No results for input(s): "CHOL", "HDL", "LDLCALC", "TRIG", "CHOLHDL", "LDLDIRECT" in the last 72 hours. Thyroid Function Tests: No results for input(s): "TSH", "T4TOTAL", "FREET4", "T3FREE", "THYROIDAB" in the last 72 hours. Anemia Panel: No results for input(s): "VITAMINB12", "FOLATE", "FERRITIN", "TIBC", "IRON", "RETICCTPCT" in the last 72 hours. Urine  analysis:    Component Value Date/Time   COLORURINE YELLOW (A) 12/07/2022 1235   APPEARANCEUR HAZY (A) 12/07/2022 1235   APPEARANCEUR Clear 11/06/2013 1847   LABSPEC 1.014 12/07/2022 1235   LABSPEC 1.018 11/06/2013 1847   PHURINE 6.0 12/07/2022 1235   GLUCOSEU NEGATIVE 12/07/2022 1235   GLUCOSEU >=500 11/06/2013 1847   HGBUR NEGATIVE 12/07/2022 1235   BILIRUBINUR NEGATIVE 12/07/2022 1235   BILIRUBINUR Negative 11/06/2013 1847   KETONESUR 5 (A) 12/07/2022 1235   PROTEINUR NEGATIVE 12/07/2022 1235   NITRITE NEGATIVE 12/07/2022 1235   LEUKOCYTESUR TRACE (A) 12/07/2022 1235   LEUKOCYTESUR Negative 11/06/2013 1847   Sepsis Labs: '@LABRCNTIP'$ (procalcitonin:4,lacticidven:4) ) Recent Results (from the past 240 hour(s))  Resp panel by RT-PCR (RSV, Flu A&B, Covid) Anterior Nasal Swab     Status: None   Collection Time: 12/07/22 11:50 AM   Specimen: Anterior Nasal Swab  Result Value Ref Range Status   SARS Coronavirus 2 by RT PCR NEGATIVE NEGATIVE Final    Comment: (NOTE) SARS-CoV-2 target nucleic acids are NOT DETECTED.  The SARS-CoV-2 RNA is generally detectable in upper respiratory specimens during the acute phase of infection. The lowest concentration of SARS-CoV-2 viral copies this assay can detect is 138 copies/mL. A negative result does not preclude SARS-Cov-2 infection and should not be used as the sole basis for treatment or other patient management decisions. A negative result may occur with  improper specimen collection/handling, submission of specimen other than nasopharyngeal swab, presence of viral mutation(s) within the areas targeted by this assay, and inadequate number of viral copies(<138 copies/mL). A negative result must be combined with clinical observations, patient history, and epidemiological information. The expected result is Negative.  Fact Sheet for Patients:  EntrepreneurPulse.com.au  Fact Sheet for Healthcare Providers:   IncredibleEmployment.be  This test is no t yet approved or cleared by the [place] FDA and  has been authorized for detection and/or diagnosis of SARS-CoV-2 by FDA under an Emergency Use Authorization (EUA). This EUA will remain  in effect (meaning this test can be used) for the duration of the COVID-19 declaration under Section 564(b)(1) of the Act, 21 U.S.C.section 360bbb-3(b)(1), unless the authorization is terminated  or revoked sooner.       Influenza A by PCR NEGATIVE NEGATIVE Final   Influenza B by PCR NEGATIVE NEGATIVE Final    Comment: (NOTE) The Xpert Xpress SARS-CoV-2/FLU/RSV plus assay is intended as  an aid in the diagnosis of influenza from Nasopharyngeal swab specimens and should not be used as a sole basis for treatment. Nasal washings and aspirates are unacceptable for Xpert Xpress SARS-CoV-2/FLU/RSV testing.  Fact Sheet for Patients: EntrepreneurPulse.com.au  Fact Sheet for Healthcare Providers: IncredibleEmployment.be  This test is not yet approved or cleared by the [place] FDA and has been authorized for detection and/or diagnosis of SARS-CoV-2 by FDA under an Emergency Use Authorization (EUA). This EUA will remain in effect (meaning this test can be used) for the duration of the COVID-19 declaration under Section 564(b)(1) of the Act, 21 U.S.C. section 360bbb-3(b)(1), unless the authorization is terminated or revoked.     Resp Syncytial Virus by PCR NEGATIVE NEGATIVE Final    Comment: (NOTE) Fact Sheet for Patients: EntrepreneurPulse.com.au  Fact Sheet for Healthcare Providers: IncredibleEmployment.be  This test is not yet approved or cleared by the [place] FDA and has been authorized for detection and/or diagnosis of SARS-CoV-2 by FDA under an Emergency Use Authorization (EUA). This EUA will remain in effect (meaning this test can be used) for  the duration of the COVID-19 declaration under Section 564(b)(1) of the Act, 21 U.S.C. section 360bbb-3(b)(1), unless the authorization is terminated or revoked.  Performed at [facility], [address] [place], [place] [address]      Radiological Exams on Admission: CT CHEST ABDOMEN PELVIS W CONTRAST  Result Date: 12/07/2022 CLINICAL DATA:  Sepsis. EXAM: CT CHEST, ABDOMEN, AND PELVIS WITH CONTRAST TECHNIQUE: Multidetector CT imaging of the chest, abdomen and pelvis was performed following the standard protocol during bolus administration of intravenous contrast. RADIATION DOSE REDUCTION: This exam was performed according to the departmental dose-optimization program which includes automated exposure control, adjustment of the mA and/or kV according to patient size and/or use of iterative reconstruction technique. CONTRAST:  136m OMNIPAQUE IOHEXOL 300 MG/ML  SOLN COMPARISON:  CT chest, abdomen, and pelvis 11/26/2022 FINDINGS: CT CHEST FINDINGS Cardiovascular: Extensive coronary atherosclerosis. Normal heart size. No pericardial effusion. Normal caliber of the thoracic aorta. Mediastinum/Nodes: No enlarged axillary, mediastinal, or hilar lymph nodes. Trace fluid in the esophagus. Unremarkable thyroid. Lungs/Pleura: New small bilateral pleural effusions with overlying minor dependent atelectasis. No pneumothorax. Severely limited assessment of the lung parenchyma due to respiratory motion. Suspected patchy ground-glass opacities bilaterally Musculoskeletal: No suspicious osseous lesion. Thoracic spondylosis with widespread bridging vertebral osteophytes. CT ABDOMEN PELVIS FINDINGS Hepatobiliary: No focal liver abnormality is identified within limitations of motion artifact. Mildly distended gallbladder without evidence of calcified gallstones. Upper limits of normal caliber of the common bile duct. Pancreas: Diffuse pancreatic atrophy. No ductal dilatation or acute inflammation. Spleen:  Unremarkable. Adrenals/Urinary Tract: Unremarkable adrenal glands. No renal calculi, hydronephrosis, or suspicious mass. Persistent mild bladder wall thickening with a few tiny dependent calcifications. Stomach/Bowel: The stomach is nondistended. There is no evidence of bowel obstruction. There is new wall thickening diffusely involving the colon and rectum. The appendix is unremarkable. Vascular/Lymphatic: Abdominal aortic atherosclerosis without aneurysm. No enlarged lymph nodes. Reproductive: Unremarkable prostate. Other: No ascites or pneumoperitoneum. Unchanged mild chronic haziness within the central mesentery and unchanged 3 cm calcified mass inferior to the hepatic flexure. Small fat-containing umbilical hernia. Musculoskeletal: No suspicious osseous lesion. Moderate spondylosis. IMPRESSION: 1. New diffuse colonic and rectal wall thickening compatible with colitis (including possibly C. difficile colitis) 2. New small bilateral pleural effusions. 3. Suspected patchy ground-glass opacities in the lungs, however assessment is severely limited by motion. 4.  Aortic Atherosclerosis (ICD10-I70.0). Electronically Signed   By: AZenia Resides  Jeralyn Ruths M.D.   On: 12/07/2022 14:37   DG Chest Port 1 View  Result Date: 12/07/2022 CLINICAL DATA:  Shortness of breath EXAM: PORTABLE CHEST 1 VIEW COMPARISON:  11/26/2022 and prior studies FINDINGS: This is a low volume study. The cardiomediastinal silhouette is unremarkable. There is no evidence of focal airspace disease, pulmonary edema, suspicious pulmonary nodule/mass, pleural effusion, or pneumothorax. No acute bony abnormalities are identified. IMPRESSION: Low volume study without evidence of acute cardiopulmonary disease. Electronically Signed   By: Margarette Canada M.D.   On: 12/07/2022 12:06      Assessment/Plan Principal Problem:   Aspiration pneumonia (HCC) Active Problems:   Colitis   Severe sepsis (HCC)   Hyponatremia   BPH (benign prostatic hyperplasia)   AF  (paroxysmal atrial fibrillation) (HCC)   Pulmonary embolus (HCC)   Acute metabolic encephalopathy   Obstructive sleep apnea on CPAP   Assessment and Plan:   Aspiration pneumonia vs. HCAP: Patient has 3 L of oxygen requirement. CT findings are suspected patchy ground-glass opacities in the lungs.  - Admitted to progressive unit as inpatient - IV Vancomycin, cefepime, flagyl (patient received 1 dose of Unasyn in ED) - Mucinex for cough  - Bronchodilators - Urine legionella and S. pneumococcal antigen - Follow up blood culture x2, sputum culture - will get Procalcitonin and trend lactic acid level per sepsis protocol - IVF: total of 3.5 L of NS bolus in ED, followed by 100 mL per hour of NS   Colitis: CT scan showed new diffuse colonic and rectal wall thickening compatible with colitis. -check C diff and GI path panel -IVF as above -on cefepime, flagyl as above  Severe sepsis [facility]): Patient meets criteria for sepsis with WBC 29.4, tachycardia with heart rate up to 115, RR 26, lactic acid 2.7. -IV fluid as above -Broad antibiotics as above -Trend lactic acid level -Check procalcitonin level  Hyponatremia: Na 129.  Possibly due to diarrhea and decreased oral intake. - IVF: as above - Sodium chloride tablet 1 g twice daily - f/u by BMP q8h - avoid over correction too fast due to risk of central pontine myelinolysis  BPH (benign prostatic hyperplasia) -hold flomax due to hypotension  AF (paroxysmal atrial fibrillation) ([place]): HR 75- 115 --> 84 -Eliquis  Pulmonary embolus (HCC) -Eliquis  Acute metabolic encephalopathy: -Frequent neurochecks -Fall precaution -Follow-up CT of head  Obstructive sleep apnea on CPAP -due to altered mental status, will hold off CPAP now      DVT ppx: on Eliquis    Code Status: DNR.  Patient's wife is at bedside, I tried to discuss with her about patient's Code status, but to me his wife is intermittently confused, I do not think she  can fully understand the meaning of CODE STATUS.  I called her step daughter, who told me that the patient's living will is DNR. It is okay to use vasopressor if needed.  Family Communication:   Yes, patient's wife at bed side. I spoke to her stepdaughter  by phone  Disposition Plan:  Anticipate discharge back to previous environment, SNF  Consults called:  none  Admission status and Level of care: Progressive:   as inpt        Dispo: The patient is from: SNF              Anticipated d/c is to: SNF              Anticipated d/c date is: 2 days  Patient currently is not medically stable to d/c.    Severity of Illness:  The appropriate patient status for this patient is INPATIENT. Inpatient status is judged to be reasonable and necessary in order to provide the required intensity of service to ensure the patient's safety. The patient's presenting symptoms, physical exam findings, and initial radiographic and laboratory data in the context of their chronic comorbidities is felt to place them at high risk for further clinical deterioration. Furthermore, it is not anticipated that the patient will be medically stable for discharge from the hospital within 2 midnights of admission.   * I certify that at the point of admission it is my clinical judgment that the patient will require inpatient hospital care spanning beyond 2 midnights from the point of admission due to high intensity of service, high risk for further deterioration and high frequency of surveillance required.*       Date of Service 12/07/2022    I[facility]   If 7PM-7AM, please contact night-coverage [email or web address] 12/07/2022, 6:34 PM

## 2022-12-07 NOTE — Consult Note (Signed)
PHARMACY -  BRIEF ANTIBIOTIC NOTE   Pharmacy has received consult(s) for Vancomycin from an ED provider.  The patient's profile has been reviewed for ht/wt/allergies/indication/available labs.    One time order(s) placed for Vancomycin '1750mg'$  IVPB x 1 dose  Further antibiotics/pharmacy consults should be ordered by admitting physician if indicated.                       Thank you, Kalesha Irving Rodriguez-Guzman PharmD, BCPS 12/07/2022 2:43 PM

## 2022-12-07 NOTE — ED Notes (Signed)
Cory [name] [phone]..call when placed in a room

## 2022-12-07 NOTE — Consult Note (Signed)
Pharmacy Antibiotic Note  Cory [name] is a 89 y.o. male admitted on 12/07/2022 with pneumonia.  Pharmacy has been consulted for cefepime and vancomycin dosing.  Vancomycin 1750 mg IV x 1 given 3/10 @ 1711  Plan: Start Vancomycin 1750 mg IV every 36 hours Goal AUC 400-550 Estimated AUC 529.7, Cmin 10.8 Wt 77.7 kg, Scr 1.15, Vd coefficient 0.72  Vancomycin levels at steady state or when clinically indicated Start Cefepime 2 grams IV every 12 hours Flagyl 500 mg IV every 12 hours ordered by provider Follow renal function and cultures for further adjustments     Temp (24hrs), Avg:98 F (36.7 C), Min:98 F (36.7 C), Max:98 F (36.7 C)  Recent Labs  Lab 12/07/22 1149 12/07/22 1415  WBC 21.4*  --   CREATININE 1.15  --   LATICACIDVEN 2.7* 2.7*    Estimated Creatinine Clearance: 42.1 mL/min (by C-G formula based on SCr of 1.15 mg/dL).    No Known Allergies  Antimicrobials this admission: Vancomycin 3/10 >>  cefepime 3/10 >>  Flagyl 3/10>> Unasyn 3/10 x 1 Azithromycin 3/10 x 1  Dose adjustments this admission: N/A  Microbiology results: 3/10 BCx: pending 3/10 Sputum: ordered  3/10 MRSA PCR: ordered  Thank you for allowing pharmacy to be a part of this patient's care.  Lorin Picket, PharmD 12/07/2022 4:21 PM

## 2022-12-07 NOTE — ED Provider Notes (Addendum)
[facility] Provider Note    Event Date/Time   First MD Initiated Contact with Patient 12/07/22 1137     (approximate)   History   Shortness of Breath   HPI  Cory [name] is a 89 y.o. male   Past medical history of diabetes, thyroid disease, HHS, atrial fibrillation on Eliquis, dysphagia and recent aspiration pneumonia, CKD who presents to the emergency department with low oxygen levels and increased lethargy over the last several days after being discharged from the hospital with aspiration pneumonia last week.  He lives at a nursing facility.  He is minimally verbal at this time and is usually verbal and conversant at baseline though bedbound.  I obtained collateral information from his wife who is at bedside as well as his daughter over the phone.  They corroborate the information is given above.  They report no falls, trauma, complaints of abdominal pain or GI/GU symptoms.  Independent Historian contributed to assessment above: The patient's wife and daughter  External Medical Documents Reviewed: Discharge summary from 11/28/2022 when he was admitted for gastroenteritis and also aspiration pneumonia      Physical Exam   Triage Vital Signs: ED Triage Vitals [12/07/22 1144]  Enc Vitals Group     BP (!) 96/49     Pulse Rate 93     Resp 20     Temp 98 F (36.7 C)     Temp Source Oral     SpO2 92 %     Weight      Height      Head Circumference      Peak Flow      Pain Score      Pain Loc      Pain Edu?      Excl. in [place]?     Most recent vital signs: Vitals:   12/07/22 1300 12/07/22 1330  BP: (!) 88/58 102/78  Pulse: 81 (!) 115  Resp: (!) 23 (!) 26  Temp:    SpO2: 96% 94%    General: Awake, no distress.  CV:  Good peripheral perfusion.  Resp:  Normal effort.  Abd:  No distention.  Other:  He was hypoxemic to the mid to high 80s on room air and put on 2 L nasal cannula with improvement to the mid 90s.  He has some rhonchi in the  right lower lung fields.  His abdomen is soft.  He is hypotensive 90s over 40s and his mucous membranes are very dry   ED Results / Procedures / Treatments   Labs (all labs ordered are listed, but only abnormal results are displayed) Labs Reviewed  LACTIC ACID, PLASMA - Abnormal; Notable for the following components:      Result Value   Lactic Acid, Venous 2.7 (*)    All other components within normal limits  COMPREHENSIVE METABOLIC PANEL - Abnormal; Notable for the following components:   Sodium 129 (*)    Chloride 91 (*)    Glucose, Bld 164 (*)    BUN 7 (*)    Calcium 8.7 (*)    Albumin 2.8 (*)    Total Bilirubin 1.3 (*)    All other components within normal limits  CBC WITH DIFFERENTIAL/PLATELET - Abnormal; Notable for the following components:   WBC 21.4 (*)    Neutro Abs 17.1 (*)    Monocytes Absolute 1.2 (*)    Abs Immature Granulocytes 0.12 (*)    All other components within normal limits  PROTIME-INR - Abnormal; Notable for the following components:   Prothrombin Time 20.4 (*)    INR 1.8 (*)    All other components within normal limits  APTT - Abnormal; Notable for the following components:   aPTT 50 (*)    All other components within normal limits  URINALYSIS, W/ REFLEX TO CULTURE (INFECTION SUSPECTED) - Abnormal; Notable for the following components:   Color, Urine YELLOW (*)    APPearance HAZY (*)    Ketones, ur 5 (*)    Leukocytes,Ua TRACE (*)    Bacteria, UA RARE (*)    All other components within normal limits  RESP PANEL BY RT-PCR (RSV, FLU A&B, COVID)  [id number]  CULTURE, BLOOD (ROUTINE X 2)  CULTURE, BLOOD (ROUTINE X 2)  LACTIC ACID, PLASMA     I ordered and reviewed the above labs they are notable for white blood cell count is markedly elevated at 21  EKG  ED ECG REPORT I, Lucillie Garfinkel, the attending physician, personally viewed and interpreted this ECG.   Date: 12/07/2022  EKG Time: 1148  Rate: 117  Rhythm: af  Axis: nl  Intervals:rbbb  ST&T  Change: no stemi    RADIOLOGY I independently reviewed and interpreted chest x-ray showed no obvious opacities or pneumothorax   PROCEDURES:  Critical Care performed: Yes, see critical care procedure note(s)  .Critical Care  Performed by: Lucillie Garfinkel, MD Authorized by: Lucillie Garfinkel, MD   Critical care provider statement:    Critical care time (minutes):  30   Critical care was time spent personally by me on the following activities:  Development of treatment plan with patient or surrogate, discussions with consultants, evaluation of patient's response to treatment, examination of patient, ordering and review of laboratory studies, ordering and review of radiographic studies, ordering and performing treatments and interventions, pulse oximetry, re-evaluation of patient's condition and review of [facility]: Medications  azithromycin (ZITHROMAX) 500 mg in sodium chloride 0.9 % 250 mL IVPB (500 mg Intravenous New Bag/Given 12/07/22 1420)  ceFEPIme (MAXIPIME) 2 g in sodium chloride 0.9 % 100 mL IVPB (has no administration in time range)  vancomycin (VANCOREADY) IVPB 1750 mg/350 mL (has no administration in time range)  metroNIDAZOLE (FLAGYL) IVPB 500 mg (has no administration in time range)  Ampicillin-Sulbactam (UNASYN) 3 g in sodium chloride 0.9 % 100 mL IVPB (0 g Intravenous Stopped 12/07/22 1351)  sodium chloride 0.9 % bolus 1,000 mL (0 mLs Intravenous Stopped 12/07/22 1335)  sodium chloride 0.9 % bolus 1,000 mL (1,000 mLs Intravenous New Bag/Given 12/07/22 1336)  iohexol (OMNIPAQUE) 300 MG/ML solution 100 mL (100 mLs Intravenous Contrast Given 12/07/22 1352)    External physician / consultants:  I spoke with hospitalist for admission and regarding care plan for this patient.   IMPRESSION / MDM / ASSESSMENT AND PLAN / ED COURSE  I reviewed the triage vital signs and the nursing notes.                                Patient's presentation is most  consistent with acute presentation with potential threat to life or bodily function.  Differential diagnosis includes, but is not limited to, sepsis most likely due to pneumonia or aspiration pneumonia, considered intra-abdominal infection, urinary tract infection   The patient is on the cardiac monitor to evaluate for evidence of arrhythmia and/or significant heart rate changes.  MDM: This is  a patient with a history of aspiration pneumonia who comes to the emergency department with lethargy and decreased oxygen saturations with focal lung rhonchi on the right side concerning for sepsis due to pneumonia.  His white blood cell count is markedly elevated and he is hypotensive.  I gave him a sepsis fluid bolus 30 cc/kg ideal body weight as well as IV antibiotics Unasyn and azithromycin for the most likely infectious cause aspiration pneumonia with atypical coverage.  However, given no findings on chest x-ray I will obtain a CT scan of the chest abdomen and pelvis to assess for other infectious causes, his creatinine is normal.  He will need admission for new O2 requirement and sepsis.  His wife is at bedside and states that the -- PATIENT IS FULL CODE -- despite multiple dnr documented from prior hospitalizations.  I spoke with daughter on the phone who states that the patient has a living well that states dnr status which she will bring to the hospital.   SEPSIS RE-EVALUATION: Mental status unchanged, making minimal verbal responses to opening eyes upon stimulation. His blood pressure has improved and his heart rate is now increased to the low 100s continues to be tachypneic but oxygen levels are stable mid 90s on 2 L nasal cannula. His CT scan is pending to evaluate for other sources of infection. Given his concern for sepsis and recent hospitalization I broadened his antibiotics to vancomycin for MRSA coverage as well as cefepime for pseudomonal coverage.  CT scan has now resulted with colitis,  so I added Flagyl for intra-abdominal coverage as well.  Admission.      FINAL CLINICAL IMPRESSION(S) / ED DIAGNOSES   Final diagnoses:  Sepsis, due to unspecified organism, unspecified whether acute organ dysfunction present ([place])  Hypoxemia  Colitis     Rx / DC Orders   ED Discharge Orders     None        Note:  This document was prepared using Dragon voice recognition software and may include unintentional dictation errors.    Lucillie Garfinkel, MD 12/07/22 1419    Lucillie Garfinkel, MD 12/07/22 [id number]    Lucillie Garfinkel, MD 12/07/22 [phone]

## 2022-12-07 NOTE — ED Triage Notes (Signed)
Pt arrives via EMS from SNF for dyspnea. Pt diagnosed with pneumonia last week. Per EMS pt was 85% spo2 on their arrival. On 3lpm via [place] spo2 90%. Pt has 2 days left of abx. Pt is non-verbal at baseline.

## 2022-12-07 NOTE — ED Notes (Signed)
Sent pt's wife at bedside at this time. Per daughter, the wife can get confused sometimes. Daughter requesting that any medical decision or be updated about this pt's care. [name] Frye [phone]. Dr. Jacelyn Grip, EDP and Ainsley Spinner, primary RN notified at this time.

## 2022-12-08 ENCOUNTER — Encounter: Payer: Self-pay | Admitting: Internal Medicine

## 2022-12-08 DIAGNOSIS — J690 Pneumonitis due to inhalation of food and vomit: Secondary | ICD-10-CM | POA: Diagnosis not present

## 2022-12-08 DIAGNOSIS — E669 Obesity, unspecified: Secondary | ICD-10-CM | POA: Insufficient documentation

## 2022-12-08 LAB — BASIC METABOLIC PANEL
Anion gap: 7 (ref 5–15)
Anion gap: 6 (ref 5–15)
Anion gap: 5 (ref 5–15)
BUN: 7 mg/dL — ABNORMAL LOW (ref 8–23)
BUN: 8 mg/dL (ref 8–23)
BUN: 8 mg/dL (ref 8–23)
CO2: 24 mmol/L (ref 22–32)
CO2: 26 mmol/L (ref 22–32)
CO2: 22 mmol/L (ref 22–32)
Calcium: 7.6 mg/dL — ABNORMAL LOW (ref 8.9–10.3)
Calcium: 7.6 mg/dL — ABNORMAL LOW (ref 8.9–10.3)
Calcium: 7.4 mg/dL — ABNORMAL LOW (ref 8.9–10.3)
Chloride: 99 mmol/L (ref 98–111)
Chloride: 101 mmol/L (ref 98–111)
Chloride: 103 mmol/L (ref 98–111)
Creatinine, Ser: 1.04 mg/dL (ref 0.61–1.24)
Creatinine, Ser: 1.04 mg/dL (ref 0.61–1.24)
Creatinine, Ser: 1.02 mg/dL (ref 0.61–1.24)
GFR, Estimated: >60 mL/min (ref >60)
GFR, Estimated: >60 mL/min (ref >60)
GFR, Estimated: >60 mL/min (ref >60)
Glucose, Bld: 144 mg/dL — ABNORMAL HIGH (ref 70–99)
Glucose, Bld: 133 mg/dL — ABNORMAL HIGH (ref 70–99)
Glucose, Bld: 136 mg/dL — ABNORMAL HIGH (ref 70–99)
Potassium: 4.3 mmol/L (ref 3.5–5.1)
Potassium: 4.2 mmol/L (ref 3.5–5.1)
Potassium: 3.9 mmol/L (ref 3.5–5.1)
Sodium: 130 mmol/L — ABNORMAL LOW (ref 135–145)
Sodium: 133 mmol/L — ABNORMAL LOW (ref 135–145)
Sodium: 130 mmol/L — ABNORMAL LOW (ref 135–145)

## 2022-12-08 LAB — BLOOD CULTURE ID PANEL (REFLEXED) - BCID2

## 2022-12-08 LAB — GASTROINTESTINAL PANEL BY PCR, STOOL (REPLACES STOOL CULTURE)

## 2022-12-08 LAB — CBC
HCT: 35.8 % — ABNORMAL LOW (ref 39.0–52.0)
Hemoglobin: 11.4 g/dL — ABNORMAL LOW (ref 13.0–17.0)
MCH: 31.1 pg (ref 26.0–34.0)
MCHC: 31.8 g/dL (ref 30.0–36.0)
MCV: 97.8 fL (ref 80.0–100.0)
Platelets: 294 10*3/uL (ref 150–400)
RBC: 3.66 MIL/uL — ABNORMAL LOW (ref 4.22–5.81)
RDW: 13.7 % (ref 11.5–15.5)
WBC: 17.6 10*3/uL — ABNORMAL HIGH (ref 4.0–10.5)
nRBC: 0 % (ref 0.0–0.2)

## 2022-12-08 LAB — GLUCOSE, CAPILLARY
Glucose-Capillary: 134 mg/dL — ABNORMAL HIGH (ref 70–99)
Glucose-Capillary: 151 mg/dL — ABNORMAL HIGH (ref 70–99)

## 2022-12-08 LAB — C DIFFICILE QUICK SCREEN W PCR REFLEX
C Diff antigen: POSITIVE — AB
C Diff toxin: NEGATIVE

## 2022-12-08 LAB — CLOSTRIDIUM DIFFICILE BY PCR, REFLEXED: Toxigenic C. Difficile by PCR: POSITIVE — AB

## 2022-12-08 LAB — CBG MONITORING, ED: Glucose-Capillary: 114 mg/dL — ABNORMAL HIGH (ref 70–99)

## 2022-12-08 LAB — MRSA NEXT GEN BY PCR, NASAL: MRSA by PCR Next Gen: NOT DETECTED

## 2022-12-08 LAB — LACTIC ACID, PLASMA
Lactic Acid, Venous: 1.6 mmol/L (ref 0.5–1.9)
Lactic Acid, Venous: 1.9 mmol/L (ref 0.5–1.9)

## 2022-12-08 MED ORDER — CLOTRIMAZOLE 1 % EX CREA
TOPICAL_CREAM | Freq: Two times a day (BID) | CUTANEOUS | Status: DC
  Filled 2022-12-08: qty 15

## 2022-12-08 MED ORDER — TAMSULOSIN HCL 0.4 MG PO CAPS
0.4000 mg | ORAL_CAPSULE | Freq: Every day | ORAL | Status: DC
  Administered 2022-12-09 – 2022-12-10 (×2): 0.4 mg via ORAL
  Filled 2022-12-08 (×3): qty 1

## 2022-12-08 NOTE — Progress Notes (Signed)
Stool sent to lab.  Barrier cream applied as well as foam dressing to eccoriation to buttocks

## 2022-12-08 NOTE — Progress Notes (Signed)
PHARMACY - PHYSICIAN COMMUNICATION CRITICAL VALUE ALERT - BLOOD CULTURE IDENTIFICATION (BCID)  Results for orders placed or performed during the hospital encounter of 12/07/22  Resp panel by RT-PCR (RSV, Flu A&B, Covid) Anterior Nasal Swab     Status: None   Collection Time: 12/07/22 11:50 AM   Specimen: Anterior Nasal Swab  Result Value Ref Range Status   SARS Coronavirus 2 by RT PCR NEGATIVE NEGATIVE Final    Comment: (NOTE) SARS-CoV-2 target nucleic acids are NOT DETECTED.  The SARS-CoV-2 RNA is generally detectable in upper respiratory specimens during the acute phase of infection. The lowest concentration of SARS-CoV-2 viral copies this assay can detect is 138 copies/mL. A negative result does not preclude SARS-Cov-2 infection and should not be used as the sole basis for treatment or other patient management decisions. A negative result may occur with  improper specimen collection/handling, submission of specimen other than nasopharyngeal swab, presence of viral mutation(s) within the areas targeted by this assay, and inadequate number of viral copies(<138 copies/mL). A negative result must be combined with clinical observations, patient history, and epidemiological information. The expected result is Negative.  Fact Sheet for Patients:  EntrepreneurPulse.com.au  Fact Sheet for Healthcare Providers:  IncredibleEmployment.be  This test is no t yet approved or cleared by the [place] FDA and  has been authorized for detection and/or diagnosis of SARS-CoV-2 by FDA under an Emergency Use Authorization (EUA). This EUA will remain  in effect (meaning this test can be used) for the duration of the COVID-19 declaration under Section 564(b)(1) of the Act, 21 U.S.C.section 360bbb-3(b)(1), unless the authorization is terminated  or revoked sooner.       Influenza A by PCR NEGATIVE NEGATIVE Final   Influenza B by PCR NEGATIVE NEGATIVE Final     Comment: (NOTE) The Xpert Xpress SARS-CoV-2/FLU/RSV plus assay is intended as an aid in the diagnosis of influenza from Nasopharyngeal swab specimens and should not be used as a sole basis for treatment. Nasal washings and aspirates are unacceptable for Xpert Xpress SARS-CoV-2/FLU/RSV testing.  Fact Sheet for Patients: EntrepreneurPulse.com.au  Fact Sheet for Healthcare Providers: IncredibleEmployment.be  This test is not yet approved or cleared by the [place] FDA and has been authorized for detection and/or diagnosis of SARS-CoV-2 by FDA under an Emergency Use Authorization (EUA). This EUA will remain in effect (meaning this test can be used) for the duration of the COVID-19 declaration under Section 564(b)(1) of the Act, 21 U.S.C. section 360bbb-3(b)(1), unless the authorization is terminated or revoked.     Resp Syncytial Virus by PCR NEGATIVE NEGATIVE Final    Comment: (NOTE) Fact Sheet for Patients: EntrepreneurPulse.com.au  Fact Sheet for Healthcare Providers: IncredibleEmployment.be  This test is not yet approved or cleared by the [place] FDA and has been authorized for detection and/or diagnosis of SARS-CoV-2 by FDA under an Emergency Use Authorization (EUA). This EUA will remain in effect (meaning this test can be used) for the duration of the COVID-19 declaration under Section 564(b)(1) of the Act, 21 U.S.C. section 360bbb-3(b)(1), unless the authorization is terminated or revoked.  Performed at [facility], [address] [place], [place] [address]   Blood Culture (routine x 2)     Status: None (Preliminary result)   Collection Time: 12/07/22  1:11 PM   Specimen: BLOOD  Result Value Ref Range Status   Specimen Description BLOOD BLOOD LEFT HAND  Final   Special Requests   Final    BOTTLES DRAWN AEROBIC ONLY Blood  Culture adequate volume   Culture  Setup Time    Final    GRAM POSITIVE COCCI AEROBIC BOTTLE ONLY Organism ID to follow CRITICAL RESULT CALLED TO, READ BACK BY AND VERIFIED WITH: Maggie Senseney 12/08/2022 AT 0422 SRR Performed at [facility], [address] [place], [place] [address]    Culture [facility] POSITIVE COCCI  Final   Report Status PENDING  Incomplete  Blood Culture ID Panel (Reflexed)     Status: Abnormal   Collection Time: 12/07/22  1:11 PM  Result Value Ref Range Status   Enterococcus faecalis NOT DETECTED NOT DETECTED Final   Enterococcus Faecium NOT DETECTED NOT DETECTED Final   Listeria monocytogenes NOT DETECTED NOT DETECTED Final   Staphylococcus species DETECTED (A) NOT DETECTED Final    Comment: CRITICAL RESULT CALLED TO, READ BACK BY AND VERIFIED WITH: Camrin Gearheart 12/08/2022 AT 0422 SRR    Staphylococcus aureus (BCID) NOT DETECTED NOT DETECTED Final   Staphylococcus epidermidis DETECTED (A) NOT DETECTED Final    Comment: Methicillin (oxacillin) resistant coagulase negative staphylococcus. Possible blood culture contaminant (unless isolated from more than one blood culture draw or clinical case suggests pathogenicity). No antibiotic treatment is indicated for blood  culture contaminants.    Staphylococcus lugdunensis NOT DETECTED NOT DETECTED Final   Streptococcus species NOT DETECTED NOT DETECTED Final   Streptococcus agalactiae NOT DETECTED NOT DETECTED Final   Streptococcus pneumoniae NOT DETECTED NOT DETECTED Final   Streptococcus pyogenes NOT DETECTED NOT DETECTED Final   A.calcoaceticus-baumannii NOT DETECTED NOT DETECTED Final   Bacteroides fragilis NOT DETECTED NOT DETECTED Final   Enterobacterales NOT DETECTED NOT DETECTED Final   Enterobacter cloacae complex NOT DETECTED NOT DETECTED Final   Escherichia coli NOT DETECTED NOT DETECTED Final   Klebsiella aerogenes NOT DETECTED NOT DETECTED Final   Klebsiella oxytoca NOT DETECTED NOT DETECTED Final   Klebsiella pneumoniae NOT DETECTED NOT DETECTED  Final   Proteus species NOT DETECTED NOT DETECTED Final   Salmonella species NOT DETECTED NOT DETECTED Final   Serratia marcescens NOT DETECTED NOT DETECTED Final   Haemophilus influenzae NOT DETECTED NOT DETECTED Final   Neisseria meningitidis NOT DETECTED NOT DETECTED Final   Pseudomonas aeruginosa NOT DETECTED NOT DETECTED Final   Stenotrophomonas maltophilia NOT DETECTED NOT DETECTED Final   Candida albicans NOT DETECTED NOT DETECTED Final   Candida auris NOT DETECTED NOT DETECTED Final   Candida glabrata NOT DETECTED NOT DETECTED Final   Candida krusei NOT DETECTED NOT DETECTED Final   Candida parapsilosis NOT DETECTED NOT DETECTED Final   Candida tropicalis NOT DETECTED NOT DETECTED Final   Cryptococcus neoformans/gattii NOT DETECTED NOT DETECTED Final   Methicillin resistance mecA/C DETECTED (A) NOT DETECTED Final    Comment: CRITICAL RESULT CALLED TO, READ BACK BY AND VERIFIED WITH: Iktan Aikman 12/08/2022 AT 0422 SRR Performed at [facility], [address] [place], [place] [address]     BCID Results: 1 (aerobic) of 3 bottles with Staph Epi, mecA/C detected.  Pt currently on Vancomycin, Cefepime, & Flagyl for pneumonia & colitis.  Name of provider contacted: Morton Amy, NP   Changes to prescribed antibiotics required: No changes at this time pending additional lab results.  Renda Rolls, PharmD, [facility] 12/08/2022 4:53 AM

## 2022-12-08 NOTE — ED Notes (Signed)
Inpatient provider made aware of patient not being given any PO medication as patient is only responsive to pain. Pain is not opening eyes and is nonverbal.

## 2022-12-08 NOTE — Progress Notes (Signed)
PROGRESS NOTE    Cory [name]  [id number] DOB: 24-Aug-1931 DOA: 12/07/2022 PCP: Madelyn Brunner, MD      Brief Narrative:   Cory [name] is a 89 y.o. male with medical history significant of DM, hypothyroidism, dementia, bed bound, CKD-3a, A fib and PE on Eliquis, BPH, aspiration pneumonia, who presents with shortness of breath and diarrhea.   Patient has worsening mental status, cannot provide any medical history.  Wife is at the bedside, but his wife seem to be intermittently confused during the interview. Per her stepdaughter (I called her stepdaughter by phone), patient's wife has dementia with intermittent confusion. Per his stepdaughter, patient is bedbound, and minimally verbal at normal baseline.  Recently patient has been declining.  Patient is normally oriented to person and place, but not to the time. Today when I saw pt in ED, pt is altered, barely arousable, not following command, not oriented x 3.  Patient does not move his legs which is normal to him.  He moves both arms.  No facial droop.    Patient was recently hospitalized from 2/28 - 3/1 due to gastroenteritis and aspiration pneumonia.  Patient had negative C diff and GI pathogen panel in previous admission. Pt was discharged on Augmentin.  Per report, patient continues to have shortness of breath, with oxygen desaturating to 85% on room air, which improved to 92-96% on 3 L oxygen.  Patient normally is not using oxygen per report.  Patient has cough, no fever, respiratory distress.  Patient continues to have diarrhea, no active nausea and vomiting, not sure if patient has abdominal pain.  Not sure if patient has symptoms of UTI.  History is very limited.   Assessment & Plan:   Principal Problem:   Aspiration pneumonia ([place]) Active Problems:   Colitis   Severe sepsis (HCC)   Hyponatremia   BPH (benign prostatic hyperplasia)   AF (paroxysmal atrial fibrillation) (HCC)   Pulmonary embolus (HCC)   Acute metabolic  encephalopathy   Obstructive sleep apnea on CPAP   Type 2 diabetes mellitus with hyperlipidemia (HCC)   CKD (chronic kidney disease), stage III ([place])   Hypothyroidism due to acquired atrophy of thyroid   Dementia ([place])   Obesity (BMI 30-39.9)  Aspiration pneumonia vs. HCAP Hypoxic respiratory failure: Patient has 3 L of oxygen requirement. CT findings are suspected patchy ground-glass opacities in the lungs. Respiratory status stable - cont vanc/cefepime/flagyl, have messaged nursing about obtaining mrsa swab, if neg can d/c vanc - SLP swallow eval - f/u cultures   Colitis: CT scan showed new diffuse colonic and rectal wall thickening compatible with colitis. C diff on ddx particularly given recent abx -check C diff and GI path panel, have messaged nursing about obtaining -IVF  -on cefepime, flagyl as above   Severe sepsis [facility]): Patient meets criteria for sepsis with WBC 29.4, tachycardia with heart rate up to 115, RR 26, lactic acid 2.7. -IV fluid as above -Broad antibiotics as above -Trend lactic acid level   Hyponatremia: Na 129.  Possibly due to diarrhea and decreased oral intake. Improved to 130 this morning with fluids - IVF, continue - trend  Staph epidermidis bacteremia On one blood culture, likely contaminant, will continue to follow   BPH (benign prostatic hyperplasia) - flomax   AF (paroxysmal atrial fibrillation) ([place]): HR 75- 115 --> 84 -Eliquis   Hx Pulmonary embolus (HCC) -Eliquis   Acute metabolic encephalopathy: Likely 2/2 above processes. CT head nothing acute   Obstructive  sleep apnea on CPAP -due to altered mental status, will hold off CPAP now  T2DM Glucose is appropriate - SSI for now  Dementia Bedbound at baseline, resides at snf  Tinea cruris - start clotrimazole   DVT prophylaxis: apixaban Code Status: dnr Family Communication: daughter Lesleigh Noe updated telephonically 3/11  Level of care: Progressive Status is: Inpatient Remains  inpatient appropriate because: severity of illness    Consultants:  none  Procedures: none  Antimicrobials:  Vanc/cefepime/flagyl    Subjective: Asleep, rouses a little  Objective: Vitals:   12/08/22 0500 12/08/22 0515 12/08/22 0615 12/08/22 0630  BP: 122/63 (!) 119/58 (!) 126/97 (!) 126/92  Pulse: 80 77 71 66  Resp: (!) 23 (!) 21 (!) 26 (!) 27  Temp:    98.3 F (36.8 C)  TempSrc:    Oral  SpO2: 96% 96% 94% 93%  Weight:        Intake/Output Summary (Last 24 hours) at 12/08/2022 0841 Last data filed at 12/08/2022 0314 Gross per 24 hour  Intake 3150 ml  Output --  Net 3150 ml   Filed Weights   12/07/22 1729  Weight: 99.3 kg    Examination:  General exam: chronically ill appearing Respiratory system: rales at bases Cardiovascular system: S1 & S2 heard, RRR. No JVD, murmurs, rubs, gallops or clicks.   Gastrointestinal system: obese, no sig tenderness Central nervous system: moving all 4 Extremities: edema to knees Skin: erythema groin Psychiatry: calm    Data Reviewed: I have personally reviewed following labs and imaging studies  CBC: Recent Labs  Lab 12/07/22 1149 12/08/22 0102  WBC 21.4* 17.6*  NEUTROABS 17.1*  --   HGB 13.9 11.4*  HCT 42.7 35.8*  MCV 95.5 97.8  PLT 348 [id number]   Basic Metabolic Panel: Recent Labs  Lab 12/07/22 1149 12/08/22 0102  NA 129* 130*  K 4.4 4.3  CL 91* 99  CO2 29 24  GLUCOSE 164* 144*  BUN 7* 7*  CREATININE 1.15 1.04  CALCIUM 8.7* 7.6*   GFR: Estimated Creatinine Clearance: 55 mL/min (by C-G formula based on SCr of 1.04 mg/dL). Liver Function Tests: Recent Labs  Lab 12/07/22 1149  AST 36  ALT 18  ALKPHOS 65  BILITOT 1.3*  PROT 7.1  ALBUMIN 2.8*   No results for input(s): "LIPASE", "AMYLASE" in the last 168 hours. No results for input(s): "AMMONIA" in the last 168 hours. Coagulation Profile: Recent Labs  Lab 12/07/22 1149  INR 1.8*   Cardiac Enzymes: No results for input(s): "CKTOTAL", "CKMB",  "CKMBINDEX", "TROPONINI" in the last 168 hours. BNP (last 3 results) No results for input(s): "PROBNP" in the last 8760 hours. HbA1C: No results for input(s): "HGBA1C" in the last 72 hours. CBG: Recent Labs  Lab 12/07/22 1744 12/07/22 2229 12/08/22 0822  GLUCAP 150* 126* 114*   Lipid Profile: No results for input(s): "CHOL", "HDL", "LDLCALC", "TRIG", "CHOLHDL", "LDLDIRECT" in the last 72 hours. Thyroid Function Tests: No results for input(s): "TSH", "T4TOTAL", "FREET4", "T3FREE", "THYROIDAB" in the last 72 hours. Anemia Panel: No results for input(s): "VITAMINB12", "FOLATE", "FERRITIN", "TIBC", "IRON", "RETICCTPCT" in the last 72 hours. Urine analysis:    Component Value Date/Time   COLORURINE YELLOW (A) 12/07/2022 1235   APPEARANCEUR HAZY (A) 12/07/2022 1235   APPEARANCEUR Clear 11/06/2013 1847   LABSPEC 1.014 12/07/2022 1235   LABSPEC 1.018 11/06/2013 1847   PHURINE 6.0 12/07/2022 1235   GLUCOSEU NEGATIVE 12/07/2022 1235   GLUCOSEU >=500 11/06/2013 1847   HGBUR NEGATIVE 12/07/2022 1235  BILIRUBINUR NEGATIVE 12/07/2022 1235   BILIRUBINUR Negative 11/06/2013 1847   KETONESUR 5 (A) 12/07/2022 1235   PROTEINUR NEGATIVE 12/07/2022 1235   NITRITE NEGATIVE 12/07/2022 1235   LEUKOCYTESUR TRACE (A) 12/07/2022 1235   LEUKOCYTESUR Negative 11/06/2013 1847   Sepsis Labs: '@LABRCNTIP'$ (procalcitonin:4,lacticidven:4)  ) Recent Results (from the past 240 hour(s))  Blood Culture (routine x 2)     Status: None (Preliminary result)   Collection Time: 12/07/22 11:49 AM   Specimen: BLOOD  Result Value Ref Range Status   Specimen Description BLOOD LEFT ANTECUBITAL  Final   Special Requests   Final    BOTTLES DRAWN AEROBIC AND ANAEROBIC Blood Culture results may not be optimal due to an excessive volume of blood received in culture bottles   Culture   Final    NO GROWTH < 24 HOURS Performed at [facility], [address] [place], [place] [address]    Report Status PENDING   Incomplete  Resp panel by RT-PCR (RSV, Flu A&B, Covid) Anterior Nasal Swab     Status: None   Collection Time: 12/07/22 11:50 AM   Specimen: Anterior Nasal Swab  Result Value Ref Range Status   SARS Coronavirus 2 by RT PCR NEGATIVE NEGATIVE Final    Comment: (NOTE) SARS-CoV-2 target nucleic acids are NOT DETECTED.  The SARS-CoV-2 RNA is generally detectable in upper respiratory specimens during the acute phase of infection. The lowest concentration of SARS-CoV-2 viral copies this assay can detect is 138 copies/mL. A negative result does not preclude SARS-Cov-2 infection and should not be used as the sole basis for treatment or other patient management decisions. A negative result may occur with  improper specimen collection/handling, submission of specimen other than nasopharyngeal swab, presence of viral mutation(s) within the areas targeted by this assay, and inadequate number of viral copies(<138 copies/mL). A negative result must be combined with clinical observations, patient history, and epidemiological information. The expected result is Negative.  Fact Sheet for Patients:  EntrepreneurPulse.com.au  Fact Sheet for Healthcare Providers:  IncredibleEmployment.be  This test is no t yet approved or cleared by the [place] FDA and  has been authorized for detection and/or diagnosis of SARS-CoV-2 by FDA under an Emergency Use Authorization (EUA). This EUA will remain  in effect (meaning this test can be used) for the duration of the COVID-19 declaration under Section 564(b)(1) of the Act, 21 U.S.C.section 360bbb-3(b)(1), unless the authorization is terminated  or revoked sooner.       Influenza A by PCR NEGATIVE NEGATIVE Final   Influenza B by PCR NEGATIVE NEGATIVE Final    Comment: (NOTE) The Xpert Xpress SARS-CoV-2/FLU/RSV plus assay is intended as an aid in the diagnosis of influenza from Nasopharyngeal swab specimens and should  not be used as a sole basis for treatment. Nasal washings and aspirates are unacceptable for Xpert Xpress SARS-CoV-2/FLU/RSV testing.  Fact Sheet for Patients: EntrepreneurPulse.com.au  Fact Sheet for Healthcare Providers: IncredibleEmployment.be  This test is not yet approved or cleared by the [place] FDA and has been authorized for detection and/or diagnosis of SARS-CoV-2 by FDA under an Emergency Use Authorization (EUA). This EUA will remain in effect (meaning this test can be used) for the duration of the COVID-19 declaration under Section 564(b)(1) of the Act, 21 U.S.C. section 360bbb-3(b)(1), unless the authorization is terminated or revoked.     Resp Syncytial Virus by PCR NEGATIVE NEGATIVE Final    Comment: (NOTE) Fact Sheet for Patients: EntrepreneurPulse.com.au  Fact Sheet for Healthcare Providers: IncredibleEmployment.be  This test is not yet approved or cleared by the [place] and has been authorized for detection and/or diagnosis of SARS-CoV-2 by FDA under an Emergency Use Authorization (EUA). This EUA will remain in effect (meaning this test can be used) for the duration of the COVID-19 declaration under Section 564(b)(1) of the Act, 21 U.S.C. section 360bbb-3(b)(1), unless the authorization is terminated or revoked.  Performed at [facility], [address] [place], [place] [address]   Blood Culture (routine x 2)     Status: None (Preliminary result)   Collection Time: 12/07/22  1:11 PM   Specimen: BLOOD  Result Value Ref Range Status   Specimen Description BLOOD BLOOD LEFT HAND  Final   Special Requests   Final    BOTTLES DRAWN AEROBIC ONLY Blood Culture adequate volume   Culture  Setup Time   Final    GRAM POSITIVE COCCI AEROBIC BOTTLE ONLY Organism ID to follow CRITICAL RESULT CALLED TO, READ BACK BY AND VERIFIED WITH: NATHAN BELUE 12/08/2022 AT 0422  SRR Performed at [facility], [address] [place], [place] [address]    Culture GRAM POSITIVE COCCI  Final   Report Status PENDING  Incomplete  Blood Culture ID Panel (Reflexed)     Status: Abnormal   Collection Time: 12/07/22  1:11 PM  Result Value Ref Range Status   Enterococcus faecalis NOT DETECTED NOT DETECTED Final   Enterococcus Faecium NOT DETECTED NOT DETECTED Final   Listeria monocytogenes NOT DETECTED NOT DETECTED Final   Staphylococcus species DETECTED (A) NOT DETECTED Final    Comment: CRITICAL RESULT CALLED TO, READ BACK BY AND VERIFIED WITH: NATHAN BELUE 12/08/2022 AT 0422 SRR    Staphylococcus aureus (BCID) NOT DETECTED NOT DETECTED Final   Staphylococcus epidermidis DETECTED (A) NOT DETECTED Final    Comment: Methicillin (oxacillin) resistant coagulase negative staphylococcus. Possible blood culture contaminant (unless isolated from more than one blood culture draw or clinical case suggests pathogenicity). No antibiotic treatment is indicated for blood  culture contaminants.    Staphylococcus lugdunensis NOT DETECTED NOT DETECTED Final   Streptococcus species NOT DETECTED NOT DETECTED Final   Streptococcus agalactiae NOT DETECTED NOT DETECTED Final   Streptococcus pneumoniae NOT DETECTED NOT DETECTED Final   Streptococcus pyogenes NOT DETECTED NOT DETECTED Final   A.calcoaceticus-baumannii NOT DETECTED NOT DETECTED Final   Bacteroides fragilis NOT DETECTED NOT DETECTED Final   Enterobacterales NOT DETECTED NOT DETECTED Final   Enterobacter cloacae complex NOT DETECTED NOT DETECTED Final   Escherichia coli NOT DETECTED NOT DETECTED Final   Klebsiella aerogenes NOT DETECTED NOT DETECTED Final   Klebsiella oxytoca NOT DETECTED NOT DETECTED Final   Klebsiella pneumoniae NOT DETECTED NOT DETECTED Final   Proteus species NOT DETECTED NOT DETECTED Final   Salmonella species NOT DETECTED NOT DETECTED Final   Serratia marcescens NOT DETECTED NOT DETECTED Final    Haemophilus influenzae NOT DETECTED NOT DETECTED Final   Neisseria meningitidis NOT DETECTED NOT DETECTED Final   Pseudomonas aeruginosa NOT DETECTED NOT DETECTED Final   Stenotrophomonas maltophilia NOT DETECTED NOT DETECTED Final   Candida albicans NOT DETECTED NOT DETECTED Final   Candida auris NOT DETECTED NOT DETECTED Final   Candida glabrata NOT DETECTED NOT DETECTED Final   Candida krusei NOT DETECTED NOT DETECTED Final   Candida parapsilosis NOT DETECTED NOT DETECTED Final   Candida tropicalis NOT DETECTED NOT DETECTED Final   Cryptococcus neoformans/gattii NOT DETECTED NOT DETECTED Final   Methicillin resistance mecA/C DETECTED (A) NOT  DETECTED Final    Comment: CRITICAL RESULT CALLED TO, READ BACK BY AND VERIFIED WITH: NATHAN BELUE 12/08/2022 AT 0422 SRR Performed at [facility], [address] [place], [place] [address]          Radiology Studies: CT HEAD WO CONTRAST (5MM)  Result Date: 12/07/2022 CLINICAL DATA:  Altered mental status EXAM: CT HEAD WITHOUT CONTRAST TECHNIQUE: Contiguous axial images were obtained from the base of the skull through the vertex without intravenous contrast. RADIATION DOSE REDUCTION: This exam was performed according to the departmental dose-optimization program which includes automated exposure control, adjustment of the mA and/or kV according to patient size and/or use of iterative reconstruction technique. COMPARISON:  Brain CT 03/17/2022 FINDINGS: Brain: Ventricles and sulci are prominent compatible with atrophy. Periventricular and subcortical white matter hypodensities compatible with chronic microvascular ischemic changes. No evidence for acute cortically based infarct, intracranial hemorrhage, mass lesion or mass effect. Vascular: Unremarkable Skull: Intact. Sinuses/Orbits: Paranasal sinuses well aerated. Opacification left mastoid air cells. Right mastoid air cells unremarkable. Other: None IMPRESSION: 1. No acute intracranial  process. 2. Atrophy and chronic microvascular ischemic changes. Electronically Signed   By: Lovey Newcomer M.D.   On: 12/07/2022 19:13   CT CHEST ABDOMEN PELVIS W CONTRAST  Result Date: 12/07/2022 CLINICAL DATA:  Sepsis. EXAM: CT CHEST, ABDOMEN, AND PELVIS WITH CONTRAST TECHNIQUE: Multidetector CT imaging of the chest, abdomen and pelvis was performed following the standard protocol during bolus administration of intravenous contrast. RADIATION DOSE REDUCTION: This exam was performed according to the departmental dose-optimization program which includes automated exposure control, adjustment of the mA and/or kV according to patient size and/or use of iterative reconstruction technique. CONTRAST:  142m OMNIPAQUE IOHEXOL 300 MG/ML  SOLN COMPARISON:  CT chest, abdomen, and pelvis 11/26/2022 FINDINGS: CT CHEST FINDINGS Cardiovascular: Extensive coronary atherosclerosis. Normal heart size. No pericardial effusion. Normal caliber of the thoracic aorta. Mediastinum/Nodes: No enlarged axillary, mediastinal, or hilar lymph nodes. Trace fluid in the esophagus. Unremarkable thyroid. Lungs/Pleura: New small bilateral pleural effusions with overlying minor dependent atelectasis. No pneumothorax. Severely limited assessment of the lung parenchyma due to respiratory motion. Suspected patchy ground-glass opacities bilaterally Musculoskeletal: No suspicious osseous lesion. Thoracic spondylosis with widespread bridging vertebral osteophytes. CT ABDOMEN PELVIS FINDINGS Hepatobiliary: No focal liver abnormality is identified within limitations of motion artifact. Mildly distended gallbladder without evidence of calcified gallstones. Upper limits of normal caliber of the common bile duct. Pancreas: Diffuse pancreatic atrophy. No ductal dilatation or acute inflammation. Spleen: Unremarkable. Adrenals/Urinary Tract: Unremarkable adrenal glands. No renal calculi, hydronephrosis, or suspicious mass. Persistent mild bladder wall thickening  with a few tiny dependent calcifications. Stomach/Bowel: The stomach is nondistended. There is no evidence of bowel obstruction. There is new wall thickening diffusely involving the colon and rectum. The appendix is unremarkable. Vascular/Lymphatic: Abdominal aortic atherosclerosis without aneurysm. No enlarged lymph nodes. Reproductive: Unremarkable prostate. Other: No ascites or pneumoperitoneum. Unchanged mild chronic haziness within the central mesentery and unchanged 3 cm calcified mass inferior to the hepatic flexure. Small fat-containing umbilical hernia. Musculoskeletal: No suspicious osseous lesion. Moderate spondylosis. IMPRESSION: 1. New diffuse colonic and rectal wall thickening compatible with colitis (including possibly C. difficile colitis) 2. New small bilateral pleural effusions. 3. Suspected patchy ground-glass opacities in the lungs, however assessment is severely limited by motion. 4.  Aortic Atherosclerosis (ICD10-I70.0). Electronically Signed   By: ALogan BoresM.D.   On: 12/07/2022 14:37   DG Chest Port 1 View  Result Date: 12/07/2022 CLINICAL DATA:  Shortness of breath  EXAM: PORTABLE CHEST 1 VIEW COMPARISON:  11/26/2022 and prior studies FINDINGS: This is a low volume study. The cardiomediastinal silhouette is unremarkable. There is no evidence of focal airspace disease, pulmonary edema, suspicious pulmonary nodule/mass, pleural effusion, or pneumothorax. No acute bony abnormalities are identified. IMPRESSION: Low volume study without evidence of acute cardiopulmonary disease. Electronically Signed   By: Margarette Canada M.D.   On: 12/07/2022 12:06        Scheduled Meds:  apixaban  5 mg Oral BID   baclofen  5 mg Oral QHS   cyanocobalamin  1,000 mcg Oral Daily   gabapentin  100 mg Oral BID   insulin aspart  0-5 Units Subcutaneous QHS   insulin aspart  0-9 Units Subcutaneous TID WC   insulin glargine-yfgn  5 Units Subcutaneous BID   ipratropium-albuterol  3 mL Nebulization Q6H    levothyroxine  100 mcg Oral Q0600   lidocaine  1 patch Transdermal Daily   multivitamin with minerals  1 tablet Oral Daily   sodium chloride  1 g Oral BID WC   Continuous Infusions:  sodium chloride 100 mL/hr at 12/08/22 0124   ceFEPime (MAXIPIME) IV Stopped (12/08/22 0314)   metronidazole Stopped (12/08/22 0230)   [START ON 12/09/2022] vancomycin       LOS: 1 day     Desma Maxim, MD [facility]   If 7PM-7AM, please contact night-coverage [email or web address] Password [facility] 12/08/2022, 8:41 AM

## 2022-12-09 ENCOUNTER — Encounter: Payer: Self-pay | Admitting: Internal Medicine

## 2022-12-09 DIAGNOSIS — Z7189 Other specified counseling: Secondary | ICD-10-CM | POA: Diagnosis not present

## 2022-12-09 DIAGNOSIS — A0472 Enterocolitis due to Clostridium difficile, not specified as recurrent: Secondary | ICD-10-CM | POA: Diagnosis not present

## 2022-12-09 DIAGNOSIS — Z515 Encounter for palliative care: Secondary | ICD-10-CM

## 2022-12-09 DIAGNOSIS — J690 Pneumonitis due to inhalation of food and vomit: Secondary | ICD-10-CM | POA: Diagnosis not present

## 2022-12-09 LAB — BRAIN NATRIURETIC PEPTIDE: B Natriuretic Peptide: 112.9 pg/mL — ABNORMAL HIGH (ref 0.0–100.0)

## 2022-12-09 LAB — GLUCOSE, CAPILLARY
Glucose-Capillary: 102 mg/dL — ABNORMAL HIGH (ref 70–99)
Glucose-Capillary: 114 mg/dL — ABNORMAL HIGH (ref 70–99)
Glucose-Capillary: 139 mg/dL — ABNORMAL HIGH (ref 70–99)

## 2022-12-09 LAB — CULTURE, BLOOD (ROUTINE X 2): Special Requests: ADEQUATE

## 2022-12-09 MED ORDER — IPRATROPIUM-ALBUTEROL 0.5-2.5 (3) MG/3ML IN SOLN
3.0000 mL | Freq: Two times a day (BID) | RESPIRATORY_TRACT | Status: DC
  Administered 2022-12-09 – 2022-12-10 (×2): 3 mL via RESPIRATORY_TRACT
  Filled 2022-12-09 (×2): qty 3

## 2022-12-09 MED ORDER — VANCOMYCIN HCL 125 MG PO CAPS
125.0000 mg | ORAL_CAPSULE | Freq: Four times a day (QID) | ORAL | Status: DC
  Administered 2022-12-09 – 2022-12-10 (×5): 125 mg via ORAL
  Filled 2022-12-09 (×7): qty 1

## 2022-12-09 NOTE — Progress Notes (Signed)
PT Cancellation Note  Patient Details Name: Cory [name] MRN: [id number] DOB: 03-22-1931   Cancelled Treatment:    Reason Eval/Treat Not Completed: PT screened, no needs identified, will sign off. This patient is bedbound, confused, and typically doesn't have much movement in his legs. He is from a facility for LTC and per notes will return back to LTC. Discussed with MD. No acute PT needs at this time. Will sign off.   Gianno Volner 12/09/2022, 1:56 PM Greggory Stallion, PT, DPT, GCS [phone]

## 2022-12-09 NOTE — Consult Note (Signed)
Consultation Note Date: 12/09/2022   Patient Name: Cory [name]  DOB: [birth year]  MRN: [id number]  Age / Sex: 89 y.o., male  PCP: Madelyn Brunner, MD Referring Physician: Enzo Bi, MD  Reason for Consultation: Establishing goals of care  HPI/Patient Profile: 89 y.o. male  with past medical history of dementia, with bedbound status being minimally verbal at normal baseline, aspiration pneumonia, CKD 3, A-fib and PE on Eliquis, BPH, DM, hypothyroid, admitted on 12/07/2022 with aspiration pneumonia versus HCAP, hypoxic respiratory failure, and colitis new diffuse colonic and rectal wall thickening compatible with colitis/C. difficile testing.   Clinical Assessment and Goals of Care: I have reviewed medical records including EPIC notes, labs and imaging, received report from RN, assessed the patient.  Cory [name] is lying quietly in bed.  He appears acutely/chronically ill and frail, pale.  He does not respond in any meaningful way to voice or touch.  He does not seem to be able to make his needs known.  There is no family at bedside at this time.  Call to step daughter, Cory [name], to discuss diagnosis prognosis, GOC, EOL wishes, disposition and options.  I introduced Palliative Medicine as specialized medical care for people living with serious illness. It focuses on providing relief from the symptoms and stress of a serious illness. The goal is to improve quality of life for both the patient and the family.  We discussed a brief life review of the patient.  Married 18 years, met online for cancer support group both spouses died with cancer.  Worked as Chief Financial Officer at [employer].  Retired 77.  No natural children. Wife has moderated dementia per her doctors. [name] has not bounced back after he got Covid.  Admitted to [facility] under LTC in Nov 2021, had LTC insurance.   We then focused on their current illness.  Overall, Cory [name] is very knowledgeable about Cory [name]'s acute and chronic health issues.  Cory [name] shares that Cory [name] has declined over the last few months.  She states that he is not eating, interacting as much.  She shares that he is sleeping a lot.  The natural disease trajectory and expectations at EOL were discussed.  Advanced directives, concepts specific to code status, artifical feeding and hydration, and rehospitalization were considered and discussed.  Cory [name] endorses DNR. States she has reviewed AD and Cory [name] would not want artificial feeding.   Hospice and Palliative Care services outpatient were explained and offered.  We talked about the benefits of treat the treatable hospice care.  Cory [name] states that she is considering hospice care but would like to speak with her siblings.  I share that she can work with the Education officer, museum at [employer] at any point for hospice care.  We talk about the concept of "let nature take its course".  We talk about the concept of do not rehospitalize.  Discussed the importance of continued conversation with family and the medical providers regarding overall plan of care and treatment options, ensuring decisions are within the context  of the patient's values and GOCs.  Questions and concerns were addressed.  Hard Choices booklet left for review. The family was encouraged to call with questions or concerns.  PMT will continue to support holistically.  Conference with attending, bedside nursing staff, transition of care team related to patient condition, needs, goals of care, disposition.   [place] paperwork notes [place] first, but as she has memory loss.  Cory [name] is second, but is primary dt [name]'s memory loss. States she has been reviewing his AD, no artificial means.     SUMMARY OF RECOMMENDATIONS   Continue to treat the treatable but no CPR or intubation Return to long-term care at [facility] where he has been since November  2021 Considering hospice care   Code Status/Advance Care Planning: DNR - NO ARTIFICIAL FEEDING   Symptom Management:  Per hospitalist, no additional needs at this time.  Palliative Prophylaxis:  Frequent Pain Assessment, Oral Care, and Turn Reposition  Additional Recommendations (Limitations, Scope, Preferences): Continue to treat the treatable but no CPR or intubation.  Psycho-social/Spiritual:  Desire for further Chaplaincy support:no Additional Recommendations: Caregiving  Support/Resources and Education on Hospice  Prognosis:  Unable to determine, guarded at this point.  3 to 6 months or less would not be surprising based on chronic illness burden, poor functional status, long-term care resident.  Discharge Planning: Anticipate return to [facility] under long-term care      Primary Diagnoses: Present on Admission:  Aspiration pneumonia ([place])  Colitis  Severe sepsis (HCC)  AF (paroxysmal atrial fibrillation) (HCC)  Hyponatremia  Pulmonary embolus (HCC)  BPH (benign prostatic hyperplasia)  Acute metabolic encephalopathy  CKD (chronic kidney disease), stage III (HCC)  Dementia (HCC)  Hypothyroidism due to acquired atrophy of thyroid  Type 2 diabetes mellitus with hyperlipidemia ([place])   I have reviewed the medical record, interviewed the patient and family, and examined the patient. The following aspects are pertinent.  Past Medical History:  Diagnosis Date   BPH (benign prostatic hyperplasia)    Chronic atrial fibrillation (HCC)    Chronic kidney disease, stage 3a (HCC)    Dementia (HCC)    Diabetes mellitus without complication (HCC)    Hypothyroidism    OSA (obstructive sleep apnea)    Pulmonary embolism (HCC)    Thyroid disease    Social History   Socioeconomic History   Marital status: Single    Spouse name: Not on file   Number of children: Not on file   Years of education: Not on file   Highest education level: Not on file  Occupational History    Not on file  Tobacco Use   Smoking status: Never   Smokeless tobacco: Never  Substance and Sexual Activity   Alcohol use: Yes   Drug use: Never   Sexual activity: Not on file  Other Topics Concern   Not on file  Social History Narrative   Not on file   Social Determinants of Health   Financial Resource Strain: Not on file  Food Insecurity: No Food Insecurity (12/08/2022)   Hunger Vital Sign    Worried About Running Out of Food in the Last Year: Never true    Ran Out of Food in the Last Year: Never true  Transportation Needs: No Transportation Needs (12/08/2022)   PRAPARE - Hydrologist (Medical): No    Lack of Transportation (Non-Medical): No  Physical Activity: Not on file  Stress: Not on file  Social  Connections: Not on file   History reviewed. No pertinent family history. Scheduled Meds:  apixaban  5 mg Oral BID   baclofen  5 mg Oral QHS   clotrimazole   Topical BID   cyanocobalamin  1,000 mcg Oral Daily   gabapentin  100 mg Oral BID   insulin aspart  0-5 Units Subcutaneous QHS   insulin aspart  0-9 Units Subcutaneous TID WC   insulin glargine-yfgn  5 Units Subcutaneous BID   ipratropium-albuterol  3 mL Nebulization BID   levothyroxine  100 mcg Oral Q0600   lidocaine  1 patch Transdermal Daily   multivitamin with minerals  1 tablet Oral Daily   tamsulosin  0.4 mg Oral Daily   vancomycin  125 mg Oral QID   Continuous Infusions: PRN Meds:.acetaminophen, acetaminophen, albuterol, dextromethorphan-guaiFENesin, Muscle Rub, ondansetron (ZOFRAN) IV Medications Prior to Admission:  Prior to Admission medications   Medication Sig Start Date End Date Taking? Authorizing Provider  apixaban (ELIQUIS) 5 MG TABS tablet Two tabs po twice a day for five days then one tab po twice a day afterwards 06/19/20  Yes Wieting, Richard, MD  Baclofen 5 MG TABS Take 1 tablet by mouth at bedtime. 11/03/22  Yes [provider]  cyanocobalamin 1000 MCG tablet  Take 1 tablet (1,000 mcg total) by mouth daily. 11/28/22  Yes Wouk, Ailene Rud, MD  gabapentin (NEURONTIN) 100 MG capsule Take 100 mg by mouth 2 (two) times daily. 11/21/22  Yes [provider]  HUMALOG KWIKPEN 100 UNIT/ML KwikPen Inject 2-8 Units into the skin 4 (four) times daily. Per sliding scale 11/03/22  Yes [provider]  ipratropium-albuterol (DUONEB) 0.5-2.5 (3) MG/3ML SOLN Take 3 mLs by nebulization every 6 (six) hours as needed (shortness of breath). 12/03/22  Yes [provider]  LEVEMIR FLEXPEN 100 UNIT/ML FlexPen Inject 12-15 Units into the skin 2 (two) times daily. 15 units qam 12 units qpm 11/08/22  Yes [provider]  levothyroxine (SYNTHROID) 100 MCG tablet Take 100 mcg by mouth daily. 10/17/22  Yes [provider]  LIDOCAINE PAIN RELIEF 4 % 1 patch daily. 07/11/22  Yes [provider]  Menthol, Topical Analgesic, (BIOFREEZE) 4 % GEL Apply 1 Application topically 3 (three) times daily.   Yes [provider]  Menthol-Zinc Oxide (CALMOSEPTINE) 0.44-20.6 % OINT Apply topically.   Yes [provider]  Multiple Vitamin (MULTIVITAMIN WITH MINERALS) TABS tablet Take 1 tablet by mouth daily.   Yes [provider]  tamsulosin (FLOMAX) 0.4 MG CAPS capsule Take 1 capsule by mouth daily. 07/29/16  Yes [provider]  acetaminophen (TYLENOL) 500 MG tablet Take 1,000 mg by mouth every 8 (eight) hours as needed.    [provider]  amoxicillin-clavulanate (AUGMENTIN) 875-125 MG tablet Take 1 tablet by mouth 2 (two) times daily. Patient not taking: Reported on 12/07/2022 11/28/22   Gwynne Edinger, MD  hydrocortisone cream 1 % Apply 1 Application topically as needed. 11/03/22   [provider]   No Known Allergies Review of Systems  Unable to perform ROS: Dementia    Physical Exam Vitals and nursing note reviewed.     Vital Signs: BP (!) 124/57 (BP Location: Left Arm)   Pulse (!) 52    Temp 98.3 F (36.8 C) (Oral)   Resp 16   Ht '5\' 8"'$  (1.727 m)   Wt 99 kg   SpO2 97%   BMI 33.19 kg/m  Pain Scale: PAINAD   Pain Score: 0-No pain  SpO2: SpO2: 97 % O2 Device:SpO2: 97 % O2 Flow Rate: .O2 Flow Rate (L/min): 2 L/min  IO: Intake/output summary:  Intake/Output Summary (Last 24 hours) at 12/09/2022 1420 Last data filed at 12/09/2022 1000 Gross per 24 hour  Intake 3528.99 ml  Output 402 ml  Net 3126.99 ml    LBM: Last BM Date : 12/09/22 Baseline Weight: Weight: 99.3 kg Most recent weight: Weight: 99 kg     Palliative Assessment/Data:     Time In: 1340 Time Out: 1455 Time Total: 75 minutes  Greater than 50%  of this time was spent counseling and coordinating care related to the above assessment and plan.  Signed by: Drue Novel, NP   Please contact Palliative Medicine Team phone at [phone] for questions and concerns.  For individual provider: See Shea Evans

## 2022-12-09 NOTE — Evaluation (Cosign Needed Addendum)
Clinical/Bedside Swallow Evaluation Patient Details  Name: Cory [name] MRN: [id number] Date of Birth: Sep 19, 1931  Today's Date: 12/09/2022 Time: SLP Start Time (ACUTE ONLY): 0900 SLP Stop Time (ACUTE ONLY): 0940 SLP Time Calculation (min) (ACUTE ONLY): 40 min  Past Medical History:  Past Medical History:  Diagnosis Date   BPH (benign prostatic hyperplasia)    Chronic atrial fibrillation (HCC)    Chronic kidney disease, stage 3a (HCC)    Dementia (HCC)    Diabetes mellitus without complication (HCC)    Hypothyroidism    OSA (obstructive sleep apnea)    Pulmonary embolism ([place])    Thyroid disease    Past Surgical History: History reviewed. No pertinent surgical history. HPI:  Per H&P, pt "is a 89 y.o. male with medical history significant of DM, hypothyroidism, dementia, bed bound, CKD-3a, A fib and PE on Eliquis, BPH, aspiration pneumonia, who presents with shortness of breath and diarrhea.     Patient has worsening mental status, cannot provide any medical history.  Wife is at the bedside, but his wife seem to be intermittently confused during the interview. Per her stepdaughter (I called her stepdaughter by phone), patient's wife has dementia with intermittent confusion. Per his stepdaughter, patient is bedbound, and minimally verbal at normal baseline.  Recently patient has been declining.  Patient is normally oriented to person and place, but not to the time. Today when I saw pt in ED, pt is altered, barely arousable, not following command, not oriented x 3.  Patient does not move his legs which is normal to him.  He moves both arms.  No facial droop.      Patient was recently hospitalized from 2/28 - 3/1 due to gastroenteritis and aspiration pneumonia.  Patient had negative C diff and GI pathogen panel in previous admission. Pt was discharged on Augmentin.  Per report, patient continues to have shortness of breath, with oxygen desaturating to 85% on room air, which improved to 92-96% on 3  L oxygen.  Patient normally is not using oxygen per report.  Patient has cough, no fever, respiratory distress.  Patient continues to have diarrhea, no active nausea and vomiting, not sure if patient has abdominal pain.  Not sure if patient has symptoms of UTI.  History is very limited.  "    Assessment / Plan / Recommendation  Clinical Impression   Pt seen today for BSE. Pt alert, cooperative, and min/mod distractible t/o eval. Noted pt intermittently slow to respond and benefited from extended wait time and repetition of statements/questions. Pt left sitting up in bed w/ call button in reach and bed alarm set.  Of note: Pt w/ baseline congested cough PRIOR TO administration of po's. Pt on RA; afebrile; WBC elevated but trending down.  Brief OM exam completed and WFL; adequate natural dentition.  Pt does not appear to present w/ any overt, apparent s/s of oropharyngeal dysphagia during this eval. Pt's swallow appears functional and at/near pt's baseline. Noted pt w/ hx of Dementia -- ANY pt w/ cognitive decline has increased risk of aspiration/aspiration pneumonia. Following General aspiration precautions can help reduce risk of aspiration.  Administered trials of thin liquids, purees, and solids (coated in puree). SLP provided full assistance w/ feeding d/t pt's arthritis. During oral phase, pt exhibited adequate mastication (rotatory chewing w/ some munching chewing patterns -- functional for solids), timely A-P bolus transit, and good bolus control/management. Noted min oral residue w/ solids which was reduced by following solids w/ a liquid. During pharyngeal  phase, pt exhibited seemingly timely pharyngeal swallow and clear vocal quality post-po's. No overt, consistent s/s of aspiration such as coughing, throat clearing, or wet vocal quality noted. Noted congested cough intermittently b/t trials. However, pt w/ baseline cough PRIOR TO po's and cough did not appear consistent w/ po's.  Noted some  slow ?auditory comprehension? difficulties t/o eval c/b slowness to respond and often requiring repetitions/redirections to respond to questions. These cues appeared helpful to effectively communicate w/ pt.  Recommend Dys 3 diet w/ thin liquids via straw. Follow General aspiration precautions and general reflux precautions. Recommend meds whole in puree. Full assist w/ feeding and provide cues for compensatory strategies. Recommend following solids w/ a liquid. Pt/RN updated and agreed.  No acute ST needs at this time. ST services will s/o. MD to reconsult if any new needs arise during admit.  SLP Visit Diagnosis: Dysphagia, unspecified (R13.10)    Aspiration Risk  Mild aspiration risk    Diet Recommendation   Recommend Dys 3 diet w/ thin liquids via straw. Follow General aspiration precautions and general reflux precautions.  Medication Administration: Whole meds with puree    Other  Recommendations Oral Care Recommendations: Oral care BID;Oral care before and after PO;Staff/trained caregiver to provide oral care    Recommendations for follow up therapy are one component of a multi-disciplinary discharge planning process, led by the attending physician.  Recommendations may be updated based on patient status, additional functional criteria and insurance authorization.  Follow up Recommendations No SLP follow up      Assistance Recommended at Discharge  Full  Functional Status Assessment    Frequency and Duration            Prognosis Prognosis for improved oropharyngeal function: Good Barriers to Reach Goals: Cognitive deficits;Time post onset;Severity of deficits      Swallow Study   General Date of Onset: 12/07/22 HPI: Per H&P, pt "is a 89 y.o. male with medical history significant of DM, hypothyroidism, dementia, bed bound, CKD-3a, A fib and PE on Eliquis, BPH, aspiration pneumonia, who presents with shortness of breath and diarrhea.     Patient has worsening mental status,  cannot provide any medical history.  Wife is at the bedside, but his wife seem to be intermittently confused during the interview. Per her stepdaughter (I called her stepdaughter by phone), patient's wife has dementia with intermittent confusion. Per his stepdaughter, patient is bedbound, and minimally verbal at normal baseline.  Recently patient has been declining.  Patient is normally oriented to person and place, but not to the time. Today when I saw pt in ED, pt is altered, barely arousable, not following command, not oriented x 3.  Patient does not move his legs which is normal to him.  He moves both arms.  No facial droop.      Patient was recently hospitalized from 2/28 - 3/1 due to gastroenteritis and aspiration pneumonia.  Patient had negative C diff and GI pathogen panel in previous admission. Pt was discharged on Augmentin.  Per report, patient continues to have shortness of breath, with oxygen desaturating to 85% on room air, which improved to 92-96% on 3 L oxygen.  Patient normally is not using oxygen per report.  Patient has cough, no fever, respiratory distress.  Patient continues to have diarrhea, no active nausea and vomiting, not sure if patient has abdominal pain.  Not sure if patient has symptoms of UTI.  History is very limited.  " Type of Study: Bedside Swallow Evaluation  Previous Swallow Assessment: 11/26/22 - pt put on Dys 3 and nectar thick liquids, upgraded to thins before d/c. 2021 - pt on dys 2 w/ thin lqiuids Diet Prior to this Study: Dysphagia 2 (finely chopped);Thin liquids (Level 0) Temperature Spikes Noted: No (WBC 17.6) Respiratory Status: Room air History of Recent Intubation: No Behavior/Cognition: Alert;Cooperative;Pleasant mood;Confused;Distractible;Requires cueing Oral Cavity Assessment: Within Functional Limits Oral Care Completed by SLP: No Oral Cavity - Dentition: Adequate natural dentition;Missing dentition Self-Feeding Abilities: Needs assist;Total assist (d/t  arthritis) Patient Positioning: Upright in bed Baseline Vocal Quality: Normal Volitional Cough:  (NT) Volitional Swallow: Able to elicit    Oral/Motor/Sensory Function Overall Oral Motor/Sensory Function: Within functional limits   Ice Chips Ice chips: Not tested   Thin Liquid Thin Liquid: Within functional limits Presentation: Straw (2-3 oz)    Nectar Thick Nectar Thick Liquid: Not tested   Honey Thick Honey Thick Liquid: Not tested   Puree Puree: Within functional limits Presentation: Spoon (~ 2 oz)   Solid     Solid: Within functional limits Presentation:  (x1 graham cracker)     Randall Hiss Graduate Clinician [place], Speech Pathology   Randall Hiss 12/09/2022,11:20 AM

## 2022-12-09 NOTE — NC FL2 (Signed)
[place] LEVEL OF CARE FORM     IDENTIFICATION  Patient Name: Cory [name] Birthdate: 30-Sep-1930 Sex: male Admission Date (Current Location): 12/07/2022  [facility] and [place] Number:  Engineering geologist and Address:         Provider Number: [id number]  Attending Physician Name and Address:  Enzo Bi, MD  Relative Name and Phone Number:       Current Level of Care: Hospital Recommended Level of Care: [place] Prior Approval Number:    Date Approved/Denied:   PASRR Number: [id number]  Discharge Plan: SNF    Current Diagnoses: Patient Active Problem List   Diagnosis Date Noted   Obesity (BMI 30-39.9) 12/08/2022   Colitis 12/07/2022   Severe sepsis ([place]) 12/07/2022   BPH (benign prostatic hyperplasia) 123456   Acute metabolic encephalopathy 123456   Gastroenteritis 11/28/2022   Pressure injury of skin 11/27/2022   Dementia ([place]) 11/27/2022   Sepsis due to undetermined organism ([place]) 11/26/2022   Lactic acidosis 11/26/2022   Aspiration pneumonia ([place]) 11/26/2022   Vomiting and diarrhea 11/26/2022   Pulmonary embolus (HCC)    Hyponatremia    Hyperkalemia    Dysphagia    Acute respiratory failure with hypoxia (HCC)    Generalized weakness    Lethargy    Acute kidney injury superimposed on CKD (HCC)    AF (paroxysmal atrial fibrillation) ([place])    Type 2 diabetes mellitus with hyperlipidemia ([place])    Acute respiratory failure due to COVID-19 ([place]) 06/13/2020   Pneumonia due to COVID-19 virus 06/12/2020   Hyperosmolar hyperglycemic state (HHS) ([place]) 06/12/2020   Intractable pain 10/17/2016   Obstructive sleep apnea on CPAP 07/29/2016   Hypothyroidism due to acquired atrophy of thyroid 01/23/2016   CKD (chronic kidney disease), stage III ([place]) 03/13/2014    Orientation RESPIRATION BLADDER Height & Weight        O2 (2L [place]) Incontinent, External catheter Weight: 99 kg Height:  '5\' 8"'$  (172.7 cm)  BEHAVIORAL  SYMPTOMS/MOOD NEUROLOGICAL BOWEL NUTRITION STATUS      Incontinent Diet (dys2)  AMBULATORY STATUS COMMUNICATION OF NEEDS Skin   Total Care   Skin abrasions, Bruising                       Personal Care Assistance Level of Assistance    Bathing Assistance: Maximum assistance Feeding assistance: Maximum assistance Dressing Assistance: Maximum assistance Total Care Assistance: Maximum assistance   Functional Limitations Info             SPECIAL CARE FACTORS FREQUENCY                       Contractures Contractures Info: Not present    Additional Factors Info  Code Status, Allergies Code Status Info: DNR Allergies Info: NKDA           Current Medications (12/09/2022):  This is the current hospital active medication list Current Facility-Administered Medications  Medication Dose Route Frequency Provider Last Rate Last Admin   acetaminophen (TYLENOL) suppository 650 mg  650 mg Rectal Q6H PRN Ivor Costa, MD       acetaminophen (TYLENOL) tablet 650 mg  650 mg Oral Q6H PRN Ivor Costa, MD       albuterol (PROVENTIL) (2.5 MG/3ML) 0.083% nebulizer solution 2.5 mg  2.5 mg Nebulization Q4H PRN Ivor Costa, MD       apixaban [facility] [place]) tablet 5 mg  5 mg Oral BID Ivor Costa, MD  5 mg at 12/09/22 0853   baclofen (LIORESAL) tablet 5 mg  5 mg Oral QHS Ivor Costa, MD   5 mg at 12/08/22 2151   clotrimazole (LOTRIMIN) 1 % cream   Topical BID Gwynne Edinger, MD   Given at 12/09/22 [id number]   cyanocobalamin (VITAMIN B12) tablet 1,000 mcg  1,000 mcg Oral Daily Ivor Costa, MD   1,000 mcg at 12/09/22 0853   dextromethorphan-guaiFENesin ([place] DM) 30-600 MG per 12 hr tablet 1 tablet  1 tablet Oral BID PRN Ivor Costa, MD       gabapentin (NEURONTIN) capsule 100 mg  100 mg Oral BID Ivor Costa, MD   100 mg at 12/09/22 0853   insulin aspart (novoLOG) injection 0-5 Units  0-5 Units Subcutaneous QHS Ivor Costa, MD       insulin aspart (novoLOG) injection 0-9 Units  0-9 Units Subcutaneous TID  WC Ivor Costa, MD   1 Units at 12/07/22 1751   insulin glargine-yfgn (SEMGLEE) injection 5 Units  5 Units Subcutaneous BID Ivor Costa, MD   5 Units at 12/08/22 2152   ipratropium-albuterol (DUONEB) 0.5-2.5 (3) MG/3ML nebulizer solution 3 mL  3 mL Nebulization Q6H Ivor Costa, MD   3 mL at 12/09/22 0743   levothyroxine (SYNTHROID) tablet 100 mcg  100 mcg Oral Q0600 Ivor Costa, MD   100 mcg at 12/09/22 0512   lidocaine (LIDODERM) 5 % 1 patch  1 patch Transdermal Daily Ivor Costa, MD       multivitamin with minerals tablet 1 tablet  1 tablet Oral Daily Ivor Costa, MD   1 tablet at 12/09/22 0853   Muscle Rub CREA 1 Application  1 Application Topical TID PRN Ivor Costa, MD       ondansetron [facility]) injection 4 mg  4 mg Intravenous Q8H PRN Ivor Costa, MD       tamsulosin [facility]) capsule 0.4 mg  0.4 mg Oral Daily Gwynne Edinger, MD   0.4 mg at 12/09/22 [id number]   vancomycin (VANCOCIN) capsule 125 mg  125 mg Oral QID Enzo Bi, MD   125 mg at 12/09/22 1148     Discharge Medications: Please see discharge summary for a list of discharge medications.  Relevant Imaging Results:  Relevant Lab Results:   Additional Information SS# [id number]  Beverly Sessions, RN

## 2022-12-09 NOTE — Progress Notes (Signed)
PROGRESS NOTE    Cory [name]  [id number] DOB: 1931/07/29 DOA: 12/07/2022 PCP: Madelyn Brunner, MD  [id number]/[id number]-[id number]  LOS: 2 days   Brief hospital course:   Assessment & Plan: Cory [name] is a 89 y.o. male with medical history significant of DM, hypothyroidism, dementia, bed bound, CKD-3a, A fib and PE on Eliquis, BPH, aspiration pneumonia, who presents with shortness of breath and diarrhea.   Patient has worsening mental status, cannot provide any medical history.  Wife is at the bedside, but his wife seem to be intermittently confused during the interview. Per her stepdaughter (I called her stepdaughter by phone), patient's wife has dementia with intermittent confusion. Per his stepdaughter, patient is bedbound, and minimally verbal at normal baseline.  Recently patient has been declining.  Patient was recently hospitalized from 2/28 - 3/1 due to gastroenteritis and aspiration pneumonia.  Patient had negative C diff and GI pathogen panel in previous admission. Pt was discharged on Augmentin.   Per report, patient continues to have shortness of breath, with oxygen desaturating to 85% on room air, which improved to 92-96% on 3 L oxygen.      Aspiration pneumonia and HCAP both ruled out --d/c abx  C diff Colitis:  CT scan showed new diffuse colonic and rectal wall thickening compatible with colitis.  presented with diarrhea. --C diff antigen pos, toxin neg, PCR pos --start oral vanc for C diff treatment.   Severe sepsis [facility]):  Patient meets criteria for sepsis with WBC 29.4, tachycardia with heart rate up to 115, RR 26, lactic acid 2.7.  Source is C diff infection.   Hyponatremia:  Na 129.  Possibly due to diarrhea and decreased oral intake. Improved to 130 with fluids   1 blood cx pos Staph epidermidis, likely contaminant   BPH (benign prostatic hyperplasia) - flomax   AF (paroxysmal atrial fibrillation) ([place]):  --cont Eliquis   Hx Pulmonary embolus (HCC) --cont  Eliquis   Acute metabolic encephalopathy: Likely 2/2 above processes. CT head nothing acute   Obstructive sleep apnea on CPAP -due to altered mental status, will hold off CPAP now   T2DM --cont glargine 5u BID --ACHS and SSI   Dementia Bedbound at baseline, resides at snf LTC   Tinea cruris - cont clotrimazole (new)   DVT prophylaxis: [id number] Code Status: DNR  Family Communication: wife updated at bedside today Level of care: Telemetry Medical Dispo:   The patient is from: SNF LTC Anticipated d/c is to: SNF LTC Anticipated d/c date is: 1-2 days   Subjective and Interval History:  RN reported pt did eat his breakfast, and then pt was sleeping mostly.   Objective: Vitals:   12/09/22 0345 12/09/22 0745 12/09/22 0851 12/09/22 1614  BP: (!) 102/49  (!) 124/57 (!) 142/94  Pulse: 63  (!) 52 72  Resp:    18  Temp: 98.6 F (37 C)  98.3 F (36.8 C) 98.2 F (36.8 C)  TempSrc:   Oral Oral  SpO2: 98% 97% 97% 97%  Weight:      Height:        Intake/Output Summary (Last 24 hours) at 12/09/2022 1852 Last data filed at 12/09/2022 1000 Gross per 24 hour  Intake 1218.38 ml  Output 302 ml  Net 916.38 ml   Filed Weights   12/07/22 1729 12/08/22 1648  Weight: 99.3 kg 99 kg    Examination:   Constitutional: NAD, sleeping CV: No cyanosis.   RESP: normal respiratory effort, on 2L  Data Reviewed: I have personally reviewed labs and imaging studies  Time spent: 50 minutes  Enzo Bi, MD [facility] If 7PM-7AM, please contact night-coverage 12/09/2022, 6:52 PM

## 2022-12-09 NOTE — TOC Initial Note (Signed)
Transition of Care [facility]) - Initial/Assessment Note    Patient Details  Name: Cory [name] MRN: [id number] Date of Birth: 1931-05-23  Transition of Care [facility]) CM/SW Contact:    Beverly Sessions, RN Phone Number: 12/09/2022, 11:53 AM  Clinical Narrative:                  Admitted for: PNA Ad[facility] from: [facility] Confirmed with Ricky at [facility] that patient is from LTC Confirmed with daughter Lesleigh Noe that plan is to return to [facility] at discharge  Chauncey sent for signature DNR placed on chart for MD to sign Will need EMS transport  MD notified that daughter request a call for update          Patient Goals and CMS Choice            Expected Discharge Plan and Services                                              Prior Living Arrangements/Services                       Activities of Daily Living Home Assistive Devices/Equipment: Wheelchair ADL Screening (condition at time of admission) Patient's cognitive ability adequate to safely complete daily activities?: No Is the patient deaf or have difficulty hearing?: No Does the patient have difficulty seeing, even when wearing glasses/contacts?: No Does the patient have difficulty concentrating, remembering, or making decisions?: Yes Patient able to express need for assistance with ADLs?: No Does the patient have difficulty dressing or bathing?: Yes Independently performs ADLs?: No Communication: Independent Dressing (OT): Dependent Is this a change from baseline?: Pre-admission baseline Grooming: Needs assistance Is this a change from baseline?: Pre-admission baseline Feeding: Independent Bathing: Dependent Is this a change from baseline?: Pre-admission baseline Toileting: Dependent Is this a change from baseline?: Pre-admission baseline In/Out Bed: Dependent Is this a change from baseline?: Pre-admission baseline Walks in Home: Dependent Is this a change from baseline?: Pre-admission  baseline Does the patient have difficulty walking or climbing stairs?: Yes Weakness of Legs: Both Weakness of Arms/Hands: Both  Permission Sought/Granted                  Emotional Assessment              Admission diagnosis:  Hypoxemia [R09.02] Colitis [K52.9] Aspiration pneumonia ([place]) [J69.0] Sepsis, due to unspecified organism, unspecified whether acute organ dysfunction present [facility]) [A41.9] Patient Active Problem List   Diagnosis Date Noted   Obesity (BMI 30-39.9) 12/08/2022   Colitis 12/07/2022   Severe sepsis ([place]) 12/07/2022   BPH (benign prostatic hyperplasia) 123456   Acute metabolic encephalopathy 123456   Gastroenteritis 11/28/2022   Pressure injury of skin 11/27/2022   Dementia ([place]) 11/27/2022   Sepsis due to undetermined organism ([place]) 11/26/2022   Lactic acidosis 11/26/2022   Aspiration pneumonia ([place]) 11/26/2022   Vomiting and diarrhea 11/26/2022   Pulmonary embolus (HCC)    Hyponatremia    Hyperkalemia    Dysphagia    Acute respiratory failure with hypoxia (HCC)    Generalized weakness    Lethargy    Acute kidney injury superimposed on CKD (HCC)    AF (paroxysmal atrial fibrillation) ([place])    Type 2 diabetes mellitus with hyperlipidemia ([place])    Acute respiratory failure due to COVID-19 ([place]) 06/13/2020  Pneumonia due to COVID-19 virus 06/12/2020   Hyperosmolar hyperglycemic state ([facility]) ([place]) 06/12/2020   Intractable pain 10/17/2016   Obstructive sleep apnea on CPAP 07/29/2016   Hypothyroidism due to acquired atrophy of thyroid 01/23/2016   CKD (chronic kidney disease), stage III ([place]) 03/13/2014   PCP:  Madelyn Brunner, MD Pharmacy:   [facility] [place] [address] Phone: [phone] Fax: [phone]     Social Determinants of Health (SDOH) Social History: SDOH Screenings   Food Insecurity: No Food Insecurity (12/08/2022)  Housing:  Low Risk  (12/08/2022)  Transportation Needs: No Transportation Needs (12/08/2022)  Utilities: Not At Risk (12/08/2022)  Tobacco Use: Low Risk  (12/08/2022)   SDOH Interventions:     Readmission Risk Interventions     No data to display

## 2022-12-10 DIAGNOSIS — Z7189 Other specified counseling: Secondary | ICD-10-CM | POA: Diagnosis not present

## 2022-12-10 DIAGNOSIS — A419 Sepsis, unspecified organism: Secondary | ICD-10-CM | POA: Diagnosis not present

## 2022-12-10 DIAGNOSIS — A0472 Enterocolitis due to Clostridium difficile, not specified as recurrent: Secondary | ICD-10-CM

## 2022-12-10 DIAGNOSIS — Z515 Encounter for palliative care: Secondary | ICD-10-CM | POA: Diagnosis not present

## 2022-12-10 LAB — CBC
HCT: 36.2 % — ABNORMAL LOW (ref 39.0–52.0)
Hemoglobin: 11.6 g/dL — ABNORMAL LOW (ref 13.0–17.0)
MCH: 31.4 pg (ref 26.0–34.0)
MCHC: 32 g/dL (ref 30.0–36.0)
MCV: 98.1 fL (ref 80.0–100.0)
Platelets: 288 10*3/uL (ref 150–400)
RBC: 3.69 MIL/uL — ABNORMAL LOW (ref 4.22–5.81)
RDW: 13.7 % (ref 11.5–15.5)
WBC: 8 10*3/uL (ref 4.0–10.5)
nRBC: 0 % (ref 0.0–0.2)

## 2022-12-10 LAB — BASIC METABOLIC PANEL
Anion gap: 5 (ref 5–15)
BUN: 7 mg/dL — ABNORMAL LOW (ref 8–23)
CO2: 25 mmol/L (ref 22–32)
Calcium: 8 mg/dL — ABNORMAL LOW (ref 8.9–10.3)
Chloride: 106 mmol/L (ref 98–111)
Creatinine, Ser: 0.91 mg/dL (ref 0.61–1.24)
GFR, Estimated: >60 mL/min (ref >60)
Glucose, Bld: 105 mg/dL — ABNORMAL HIGH (ref 70–99)
Potassium: 3.4 mmol/L — ABNORMAL LOW (ref 3.5–5.1)
Sodium: 136 mmol/L (ref 135–145)

## 2022-12-10 LAB — GLUCOSE, CAPILLARY
Glucose-Capillary: 174 mg/dL — ABNORMAL HIGH (ref 70–99)
Glucose-Capillary: 101 mg/dL — ABNORMAL HIGH (ref 70–99)

## 2022-12-10 LAB — MAGNESIUM: Magnesium: 1.9 mg/dL (ref 1.7–2.4)

## 2022-12-10 MED ORDER — VANCOMYCIN HCL 125 MG PO CAPS: 125.0000 mg | ORAL_CAPSULE | Freq: Four times a day (QID) | ORAL | 0 refills | Status: AC

## 2022-12-10 MED ORDER — CLOTRIMAZOLE 1 % EX CREA: TOPICAL_CREAM | Freq: Two times a day (BID) | CUTANEOUS | 0 refills | Status: AC

## 2022-12-10 MED ORDER — POTASSIUM CHLORIDE 20 MEQ PO PACK
40.0000 meq | PACK | Freq: Once | ORAL | Status: AC
  Administered 2022-12-10: 40 meq via ORAL
  Filled 2022-12-10: qty 2

## 2022-12-10 MED ORDER — LEVEMIR FLEXPEN 100 UNIT/ML ~~LOC~~ SOPN: 5.0000 [IU] | PEN_INJECTOR | Freq: Two times a day (BID) | SUBCUTANEOUS | Status: DC

## 2022-12-10 MED ORDER — APIXABAN 5 MG PO TABS: 5.0000 mg | ORAL_TABLET | Freq: Two times a day (BID) | ORAL | 0 refills | Status: DC

## 2022-12-10 NOTE — TOC Transition Note (Signed)
Transition of Care [facility]) - CM/SW Discharge Note   Patient Details  Name: Cory [name] MRN: [id number] Date of Birth: [birth year]  [facility] CM/SW Contact:  Beverly Sessions, RN Phone Number: 12/10/2022, 10:20 AM   Clinical Narrative:    Patient will DC to: [facility] Anticipated DC date: 12/10/22  Family notified: Aniceto Boss with palliative notified step daughter Catering manager byJohnanna Schneiders  Per MD patient ready for DC to . RN, patient's family, and facility notified of DC. Discharge Summary sent to facility. RN given number for report. DC packet on chart. Ambulance transport requested for patient.   Referral made to [facility] with Manufacturing engineer for outpatient hospice.  Tasha with Palliative discussed with Lesleigh Noe and she was in agreement  TOC signing off.  Isaias Cowman [facility] [phone]          Patient Goals and CMS Choice      Discharge Placement                         Discharge Plan and Services Additional resources added to the After Visit Summary for                                       Social Determinants of Health (SDOH) Interventions SDOH Screenings   Food Insecurity: No Food Insecurity (12/08/2022)  Housing: Low Risk  (12/08/2022)  Transportation Needs: No Transportation Needs (12/08/2022)  Utilities: Not At Risk (12/08/2022)  Tobacco Use: Low Risk  (12/09/2022)     Readmission Risk Interventions     No data to display

## 2022-12-10 NOTE — Discharge Summary (Addendum)
Physician Discharge Summary   Cory [name]  male DOB: [birth year]  [id number]  PCP: Madelyn Brunner, MD  Admit date: 12/07/2022 Discharge date: 12/10/2022  Admitted From: SNF LTC Disposition:  SNF LTC with hospice Daughter [facility]) updated on the discharge plan prior to discharge.  CODE STATUS: DNR  Discharge Instructions     Diet Carb Modified   Complete by: As directed    No wound care   Complete by: As directed       Hospital Course:  For full details, please see H&P, progress notes, consult notes and ancillary notes.  Briefly,  Cory [name] is a 89 y.o. male with medical history significant of DM, hypothyroidism, dementia, bed bound, CKD-3a, A fib and PE on Eliquis, BPH, who presented from SNF LTC with shortness of breath and diarrhea.   Per his stepdaughter, patient is bedbound, and minimally verbal at normal baseline.  Recently patient has been declining.  Patient was recently hospitalized from 2/28 - 3/1 due to gastroenteritis and aspiration pneumonia.  Patient had negative C diff and GI pathogen panel in previous admission. Pt was discharged on Augmentin.    Per report, patient continues to have shortness of breath, with intermittent oxygen desaturating to 85% on room air, which improved to 92-96% on 3 L oxygen.      Aspiration pneumonia and HCAP both ruled out --d/c'ed abx   Acute hypoxic respiratory failure, POA  --CXR showed low volume and no acute finding.  Pt is chronically bed-bound and obese.  Likely hypoventilating syndrome.  Sating high 90's-100% on 2L prior to discharge.  C diff Colitis:  CT scan showed new diffuse colonic and rectal wall thickening compatible with colitis.  presented with diarrhea. --C diff antigen pos, toxin neg, PCR pos --start oral vanc for C diff treatment, discharged on 9 more days of oral Vanc to fininsh a 10-day course.  Diarrhea improved prior to discharge.   Severe sepsis [facility]):  Patient meets criteria for  sepsis with WBC 29.4, tachycardia with heart rate up to 115, RR 26, lactic acid 2.7.  Source is C diff infection.   Hyponatremia, resolved Na 129.  Possibly due to diarrhea and decreased oral intake. Improved with fluids and was 136 prior to discharge.   1 blood cx pos Staph epidermidis, likely contaminant   BPH (benign prostatic hyperplasia) - flomax   AF (paroxysmal atrial fibrillation) ([place]):  --cont Eliquis   Hx Pulmonary embolus (HCC) --cont Eliquis   Acute metabolic encephalopathy: Likely 2/2 C diff colitis with underlying dementia. CT head nothing acute   Obstructive sleep apnea on CPAP   T2DM --BG well controlled on glargine 5u BID and SSI during hospitalization.  Pt was discharged on the same regimen.   Dementia Bedbound at baseline, resides at snf LTC   Tinea cruris - cont clotrimazole to groin area.  Continue for 7 more days after discharge.  Pressure Injury Coccyx Stage 2, POA   Discharge Diagnoses:  Principal Problem:   Aspiration pneumonia ([place]) Active Problems:   Colitis   Severe sepsis (HCC)   Hyponatremia   BPH (benign prostatic hyperplasia)   AF (paroxysmal atrial fibrillation) (HCC)   Pulmonary embolus (HCC)   Acute metabolic encephalopathy   Obstructive sleep apnea on CPAP   Type 2 diabetes mellitus with hyperlipidemia (HCC)   CKD (chronic kidney disease), stage III ([place])   Hypothyroidism due to acquired atrophy of thyroid   Dementia ([place])   Obesity (BMI 30-39.9)  30 Day Unplanned Readmission Risk Score    Flowsheet Row ED to Hosp-Admission (Current) from 12/07/2022 in [facility]  30 Day Unplanned Readmission Risk Score (%) 18.5 Filed at 12/10/2022 0801       This score is the patient's risk of an unplanned readmission within 30 days of being discharged (0 -100%). The score is based on dignosis, age, lab data, medications, orders, and past utilization.   Low:  0-14.9   Medium: 15-21.9   High: 22-29.9    Extreme: 30 and above         Discharge Instructions:  Allergies as of 12/10/2022   No Known Allergies      Medication List     STOP taking these medications    amoxicillin-clavulanate 875-125 MG tablet Commonly known as: AUGMENTIN       TAKE these medications    acetaminophen 500 MG tablet Commonly known as: TYLENOL Take 1,000 mg by mouth every 8 (eight) hours as needed.   apixaban 5 MG Tabs tablet Commonly known as: ELIQUIS Take 1 tablet (5 mg total) by mouth 2 (two) times daily. Home med. What changed:  how much to take how to take this when to take this additional instructions   Baclofen 5 MG Tabs Take 1 tablet by mouth at bedtime.   Biofreeze 4 % Gel Generic drug: Menthol (Topical Analgesic) Apply 1 Application topically 3 (three) times daily.   Calmoseptine 0.44-20.6 % Oint Generic drug: Menthol-Zinc Oxide Apply topically.   clotrimazole 1 % cream Commonly known as: LOTRIMIN Apply topically 2 (two) times daily for 7 days.   cyanocobalamin 1000 MCG tablet Take 1 tablet (1,000 mcg total) by mouth daily.   gabapentin 100 MG capsule Commonly known as: NEURONTIN Take 100 mg by mouth 2 (two) times daily.   HumaLOG KwikPen 100 UNIT/ML KwikPen Generic drug: insulin lispro Inject 2-8 Units into the skin 4 (four) times daily. Per sliding scale   hydrocortisone cream 1 % Apply 1 Application topically as needed.   ipratropium-albuterol 0.5-2.5 (3) MG/3ML Soln Commonly known as: DUONEB Take 3 mLs by nebulization every 6 (six) hours as needed (shortness of breath).   Levemir FlexPen 100 UNIT/ML FlexPen Generic drug: insulin detemir Inject 5 Units into the skin 2 (two) times daily. What changed:  how much to take additional instructions   levothyroxine 100 MCG tablet Commonly known as: SYNTHROID Take 100 mcg by mouth daily.   Lidocaine Pain Relief 4 % Generic drug: lidocaine 1 patch daily.   multivitamin with minerals Tabs tablet Take 1  tablet by mouth daily.   tamsulosin 0.4 MG Caps capsule Commonly known as: FLOMAX Take 1 capsule by mouth daily.   vancomycin 125 MG capsule Commonly known as: VANCOCIN Take 1 capsule (125 mg total) by mouth 4 (four) times daily for 9 days.          No Known Allergies   The results of significant diagnostics from this hospitalization (including imaging, microbiology, ancillary and laboratory) are listed below for reference.   Consultations:   Procedures/Studies: CT HEAD WO CONTRAST (5MM)  Result Date: 12/07/2022 CLINICAL DATA:  Altered mental status EXAM: CT HEAD WITHOUT CONTRAST TECHNIQUE: Contiguous axial images were obtained from the base of the skull through the vertex without intravenous contrast. RADIATION DOSE REDUCTION: This exam was performed according to the departmental dose-optimization program which includes automated exposure control, adjustment of the mA and/or kV according to patient size and/or use of iterative reconstruction technique. COMPARISON:  Brain CT 03/17/2022 FINDINGS: Brain: Ventricles and sulci are prominent compatible with atrophy. Periventricular and subcortical white matter hypodensities compatible with chronic microvascular ischemic changes. No evidence for acute cortically based infarct, intracranial hemorrhage, mass lesion or mass effect. Vascular: Unremarkable Skull: Intact. Sinuses/Orbits: Paranasal sinuses well aerated. Opacification left mastoid air cells. Right mastoid air cells unremarkable. Other: None IMPRESSION: 1. No acute intracranial process. 2. Atrophy and chronic microvascular ischemic changes. Electronically Signed   By: Lovey Newcomer M.D.   On: 12/07/2022 19:13   CT CHEST ABDOMEN PELVIS W CONTRAST  Result Date: 12/07/2022 CLINICAL DATA:  Sepsis. EXAM: CT CHEST, ABDOMEN, AND PELVIS WITH CONTRAST TECHNIQUE: Multidetector CT imaging of the chest, abdomen and pelvis was performed following the standard protocol during bolus administration of  intravenous contrast. RADIATION DOSE REDUCTION: This exam was performed according to the departmental dose-optimization program which includes automated exposure control, adjustment of the mA and/or kV according to patient size and/or use of iterative reconstruction technique. CONTRAST:  127m OMNIPAQUE IOHEXOL 300 MG/ML  SOLN COMPARISON:  CT chest, abdomen, and pelvis 11/26/2022 FINDINGS: CT CHEST FINDINGS Cardiovascular: Extensive coronary atherosclerosis. Normal heart size. No pericardial effusion. Normal caliber of the thoracic aorta. Mediastinum/Nodes: No enlarged axillary, mediastinal, or hilar lymph nodes. Trace fluid in the esophagus. Unremarkable thyroid. Lungs/Pleura: New small bilateral pleural effusions with overlying minor dependent atelectasis. No pneumothorax. Severely limited assessment of the lung parenchyma due to respiratory motion. Suspected patchy ground-glass opacities bilaterally Musculoskeletal: No suspicious osseous lesion. Thoracic spondylosis with widespread bridging vertebral osteophytes. CT ABDOMEN PELVIS FINDINGS Hepatobiliary: No focal liver abnormality is identified within limitations of motion artifact. Mildly distended gallbladder without evidence of calcified gallstones. Upper limits of normal caliber of the common bile duct. Pancreas: Diffuse pancreatic atrophy. No ductal dilatation or acute inflammation. Spleen: Unremarkable. Adrenals/Urinary Tract: Unremarkable adrenal glands. No renal calculi, hydronephrosis, or suspicious mass. Persistent mild bladder wall thickening with a few tiny dependent calcifications. Stomach/Bowel: The stomach is nondistended. There is no evidence of bowel obstruction. There is new wall thickening diffusely involving the colon and rectum. The appendix is unremarkable. Vascular/Lymphatic: Abdominal aortic atherosclerosis without aneurysm. No enlarged lymph nodes. Reproductive: Unremarkable prostate. Other: No ascites or pneumoperitoneum. Unchanged mild  chronic haziness within the central mesentery and unchanged 3 cm calcified mass inferior to the hepatic flexure. Small fat-containing umbilical hernia. Musculoskeletal: No suspicious osseous lesion. Moderate spondylosis. IMPRESSION: 1. New diffuse colonic and rectal wall thickening compatible with colitis (including possibly C. difficile colitis) 2. New small bilateral pleural effusions. 3. Suspected patchy ground-glass opacities in the lungs, however assessment is severely limited by motion. 4.  Aortic Atherosclerosis (ICD10-I70.0). Electronically Signed   By: ALogan BoresM.D.   On: 12/07/2022 14:37   DG Chest Port 1 View  Result Date: 12/07/2022 CLINICAL DATA:  Shortness of breath EXAM: PORTABLE CHEST 1 VIEW COMPARISON:  11/26/2022 and prior studies FINDINGS: This is a low volume study. The cardiomediastinal silhouette is unremarkable. There is no evidence of focal airspace disease, pulmonary edema, suspicious pulmonary nodule/mass, pleural effusion, or pneumothorax. No acute bony abnormalities are identified. IMPRESSION: Low volume study without evidence of acute cardiopulmonary disease. Electronically Signed   By: JMargarette CanadaM.D.   On: 12/07/2022 12:06   CT CHEST ABDOMEN PELVIS W CONTRAST  Result Date: 11/26/2022 CLINICAL DATA:  Emesis and diarrhea. Exposure to gastroenteritis. Sepsis. EXAM: CT CHEST, ABDOMEN, AND PELVIS WITH CONTRAST TECHNIQUE: Multidetector CT imaging of the chest, abdomen and pelvis was performed following the standard protocol during bolus administration  of intravenous contrast. RADIATION DOSE REDUCTION: This exam was performed according to the departmental dose-optimization program which includes automated exposure control, adjustment of the mA and/or kV according to patient size and/or use of iterative reconstruction technique. CONTRAST:  189m OMNIPAQUE IOHEXOL 300 MG/ML  SOLN COMPARISON:  Chest CTA 06/17/2020 FINDINGS: CT CHEST FINDINGS Cardiovascular: Normal heart size. No  pericardial effusion. Extensive atheromatous calcification of the coronaries. No acute vascular finding Mediastinum/Nodes: No mass or adenopathy Lungs/Pleura: Patchy ground-glass opacity in the dependent lungs. The central airways are clear. No effusion or edema there is a subpleural scar-like opacity incited by the first costochondral junction on the left. Musculoskeletal: Generalized spondylosis with multi-level bridging osteophyte. CT ABDOMEN PELVIS FINDINGS Hepatobiliary: No focal liver abnormality.No evidence of biliary obstruction or stone. Pancreas: Generalized atrophy. Spleen: Unremarkable. Adrenals/Urinary Tract: Negative adrenals. No hydronephrosis or stone. Thick walled bladder with tiny dependent calcification. Stomach/Bowel: No obstruction. No appendicitis. Hazy small bowel mesentery attributed to remote panniculitis, essentially stable. Separate 2.3 cm calcified mass beneath the hepatic flexure which is stable. No associated adenopathy. Vascular/Lymphatic: No acute vascular abnormality. Ordinary atheromatous change. No mass or adenopathy. Reproductive:No acute finding Other: No ascites or pneumoperitoneum. Musculoskeletal: Generalized spondylosis. IMPRESSION: 1. Patchy bilateral airspace disease, mainly dependent and likely from aspiration in this setting. 2. No acute intra-abdominal findings. Electronically Signed   By: JJorje GuildM.D.   On: 11/26/2022 04:37   DG Chest Portable 1 View  Result Date: 11/26/2022 CLINICAL DATA:  Fever EXAM: PORTABLE CHEST 1 VIEW COMPARISON:  07/31/2020 FINDINGS: Stable cardiomediastinal silhouette. No focal consolidation, pleural effusion, or pneumothorax. No acute osseous abnormality. IMPRESSION: No active disease. Electronically Signed   By: TPlacido SouM.D.   On: 11/26/2022 01:46      Labs: BNP (last 3 results) Recent Labs    12/09/22 1135  BNP [id number]   Basic Metabolic Panel: Recent Labs  Lab 12/07/22 1149 12/08/22 0102 12/08/22 0713  12/08/22 1626 12/10/22 0509  NA 129* 130* 133* 130* 136  K 4.4 4.3 4.2 3.9 3.4*  CL 91* 99 101 103 106  CO2 '29 24 26 22 25  '$ GLUCOSE 164* 144* 133* 136* 105*  BUN 7* 7* 8 8 7*  CREATININE 1.15 1.04 1.04 1.02 0.91  CALCIUM 8.7* 7.6* 7.6* 7.4* 8.0*  MG  --   --   --   --  1.9   Liver Function Tests: Recent Labs  Lab 12/07/22 1149  AST 36  ALT 18  ALKPHOS 65  BILITOT 1.3*  PROT 7.1  ALBUMIN 2.8*   No results for input(s): "LIPASE", "AMYLASE" in the last 168 hours. No results for input(s): "AMMONIA" in the last 168 hours. CBC: Recent Labs  Lab 12/07/22 1149 12/08/22 0102 12/10/22 0509  WBC 21.4* 17.6* 8.0  NEUTROABS 17.1*  --   --   HGB 13.9 11.4* 11.6*  HCT 42.7 35.8* 36.2*  MCV 95.5 97.8 98.1  PLT 348 294 288   Cardiac Enzymes: No results for input(s): "CKTOTAL", "CKMB", "CKMBINDEX", "TROPONINI" in the last 168 hours. BNP: Invalid input(s): "POCBNP" CBG: Recent Labs  Lab 12/09/22 0849 12/09/22 1145 12/09/22 1611 12/09/22 2128 12/10/22 0738  GLUCAP 102* 114* 139* 174* 101*   D-Dimer No results for input(s): "DDIMER" in the last 72 hours. Hgb A1c No results for input(s): "HGBA1C" in the last 72 hours. Lipid Profile No results for input(s): "CHOL", "HDL", "LDLCALC", "TRIG", "CHOLHDL", "LDLDIRECT" in the last 72 hours. Thyroid function studies No results for input(s): "TSH", "T4TOTAL", "T3FREE", "THYROIDAB" in  the last 72 hours.  Invalid input(s): "FREET3" Anemia work up No results for input(s): "VITAMINB12", "FOLATE", "FERRITIN", "TIBC", "IRON", "RETICCTPCT" in the last 72 hours. Urinalysis    Component Value Date/Time   COLORURINE YELLOW (A) 12/07/2022 1235   APPEARANCEUR HAZY (A) 12/07/2022 1235   APPEARANCEUR Clear 11/06/2013 1847   LABSPEC 1.014 12/07/2022 1235   LABSPEC 1.018 11/06/2013 1847   PHURINE 6.0 12/07/2022 1235   GLUCOSEU NEGATIVE 12/07/2022 1235   GLUCOSEU >=500 11/06/2013 1847   HGBUR NEGATIVE 12/07/2022 1235   BILIRUBINUR  NEGATIVE 12/07/2022 1235   BILIRUBINUR Negative 11/06/2013 1847   KETONESUR 5 (A) 12/07/2022 1235   PROTEINUR NEGATIVE 12/07/2022 1235   NITRITE NEGATIVE 12/07/2022 1235   LEUKOCYTESUR TRACE (A) 12/07/2022 1235   LEUKOCYTESUR Negative 11/06/2013 1847   Sepsis Labs Recent Labs  Lab 12/07/22 1149 12/08/22 0102 12/10/22 0509  WBC 21.4* 17.6* 8.0   Microbiology Recent Results (from the past 240 hour(s))  Blood Culture (routine x 2)     Status: None (Preliminary result)   Collection Time: 12/07/22 11:49 AM   Specimen: BLOOD  Result Value Ref Range Status   Specimen Description BLOOD LEFT ANTECUBITAL  Final   Special Requests   Final    BOTTLES DRAWN AEROBIC AND ANAEROBIC Blood Culture results may not be optimal due to an excessive volume of blood received in culture bottles   Culture   Final    NO GROWTH 3 DAYS Performed at [facility], [address], [place], [place] [address]    Report Status PENDING  Incomplete  Resp panel by RT-PCR (RSV, Flu A&B, Covid) Anterior Nasal Swab     Status: None   Collection Time: 12/07/22 11:50 AM   Specimen: Anterior Nasal Swab  Result Value Ref Range Status   SARS Coronavirus 2 by RT PCR NEGATIVE NEGATIVE Final    Comment: (NOTE) SARS-CoV-2 target nucleic acids are NOT DETECTED.  The SARS-CoV-2 RNA is generally detectable in upper respiratory specimens during the acute phase of infection. The lowest concentration of SARS-CoV-2 viral copies this assay can detect is 138 copies/mL. A negative result does not preclude SARS-Cov-2 infection and should not be used as the sole basis for treatment or other patient management decisions. A negative result may occur with  improper specimen collection/handling, submission of specimen other than nasopharyngeal swab, presence of viral mutation(s) within the areas targeted by this assay, and inadequate number of viral copies(<138 copies/mL). A negative result must be combined with clinical  observations, patient history, and epidemiological information. The expected result is Negative.  Fact Sheet for Patients:  EntrepreneurPulse.com.au  Fact Sheet for Healthcare Providers:  IncredibleEmployment.be  This test is no t yet approved or cleared by the [place] FDA and  has been authorized for detection and/or diagnosis of SARS-CoV-2 by FDA under an Emergency Use Authorization (EUA). This EUA will remain  in effect (meaning this test can be used) for the duration of the COVID-19 declaration under Section 564(b)(1) of the Act, 21 U.S.C.section 360bbb-3(b)(1), unless the authorization is terminated  or revoked sooner.       Influenza A by PCR NEGATIVE NEGATIVE Final   Influenza B by PCR NEGATIVE NEGATIVE Final    Comment: (NOTE) The Xpert Xpress SARS-CoV-2/FLU/RSV plus assay is intended as an aid in the diagnosis of influenza from Nasopharyngeal swab specimens and should not be used as a sole basis for treatment. Nasal washings and aspirates are unacceptable for Xpert Xpress SARS-CoV-2/FLU/RSV testing.  Fact Sheet for Patients:  EntrepreneurPulse.com.au  Fact Sheet for Healthcare Providers: IncredibleEmployment.be  This test is not yet approved or cleared by the [place] FDA and has been authorized for detection and/or diagnosis of SARS-CoV-2 by FDA under an Emergency Use Authorization (EUA). This EUA will remain in effect (meaning this test can be used) for the duration of the COVID-19 declaration under Section 564(b)(1) of the Act, 21 U.S.C. section 360bbb-3(b)(1), unless the authorization is terminated or revoked.     Resp Syncytial Virus by PCR NEGATIVE NEGATIVE Final    Comment: (NOTE) Fact Sheet for Patients: EntrepreneurPulse.com.au  Fact Sheet for Healthcare Providers: IncredibleEmployment.be  This test is not yet approved or cleared by  the [place] FDA and has been authorized for detection and/or diagnosis of SARS-CoV-2 by FDA under an Emergency Use Authorization (EUA). This EUA will remain in effect (meaning this test can be used) for the duration of the COVID-19 declaration under Section 564(b)(1) of the Act, 21 U.S.C. section 360bbb-3(b)(1), unless the authorization is terminated or revoked.  Performed at [facility], [address], [place], [place] [address]   Blood Culture (routine x 2)     Status: Abnormal   Collection Time: 12/07/22  1:11 PM   Specimen: BLOOD  Result Value Ref Range Status   Specimen Description   Final    BLOOD BLOOD LEFT HAND Performed at [facility], [address] [place], [place] [address]    Special Requests   Final    BOTTLES DRAWN AEROBIC ONLY Blood Culture adequate volume Performed at [facility], [address], [place], [place] [address]    Culture  Setup Time   Final    GRAM POSITIVE COCCI AEROBIC BOTTLE ONLY Organism ID to follow CRITICAL RESULT CALLED TO, READ BACK BY AND VERIFIED WITH: NATHAN BELUE 12/08/2022 AT 0422 SRR Performed at [facility], [address] [place], [place] [address]    Culture (A)  Final    STAPHYLOCOCCUS CAPITIS THE SIGNIFICANCE OF ISOLATING THIS ORGANISM FROM A SINGLE SET OF BLOOD CULTURES WHEN MULTIPLE SETS ARE DRAWN IS UNCERTAIN. PLEASE NOTIFY THE MICROBIOLOGY DEPARTMENT WITHIN ONE WEEK IF SPECIATION AND SENSITIVITIES ARE REQUIRED. Performed at [facility], [address] [place], [place] [address]    Report Status 12/09/2022 FINAL  Final  Blood Culture ID Panel (Reflexed)     Status: Abnormal   Collection Time: 12/07/22  1:11 PM  Result Value Ref Range Status   Enterococcus faecalis NOT DETECTED NOT DETECTED Final   Enterococcus Faecium NOT DETECTED NOT DETECTED Final   Listeria monocytogenes NOT DETECTED NOT DETECTED Final   Staphylococcus species DETECTED (A) NOT DETECTED Final     Comment: CRITICAL RESULT CALLED TO, READ BACK BY AND VERIFIED WITH: NATHAN BELUE 12/08/2022 AT 0422 SRR    Staphylococcus aureus (BCID) NOT DETECTED NOT DETECTED Final   Staphylococcus epidermidis DETECTED (A) NOT DETECTED Final    Comment: Methicillin (oxacillin) resistant coagulase negative staphylococcus. Possible blood culture contaminant (unless isolated from more than one blood culture draw or clinical case suggests pathogenicity). No antibiotic treatment is indicated for blood  culture contaminants.    Staphylococcus lugdunensis NOT DETECTED NOT DETECTED Final   Streptococcus species NOT DETECTED NOT DETECTED Final   Streptococcus agalactiae NOT DETECTED NOT DETECTED Final   Streptococcus pneumoniae NOT DETECTED NOT DETECTED Final   Streptococcus pyogenes NOT DETECTED NOT DETECTED Final   A.calcoaceticus-baumannii NOT DETECTED NOT DETECTED Final   Bacteroides fragilis NOT DETECTED NOT DETECTED Final  Enterobacterales NOT DETECTED NOT DETECTED Final   Enterobacter cloacae complex NOT DETECTED NOT DETECTED Final   Escherichia coli NOT DETECTED NOT DETECTED Final   Klebsiella aerogenes NOT DETECTED NOT DETECTED Final   Klebsiella oxytoca NOT DETECTED NOT DETECTED Final   Klebsiella pneumoniae NOT DETECTED NOT DETECTED Final   Proteus species NOT DETECTED NOT DETECTED Final   Salmonella species NOT DETECTED NOT DETECTED Final   Serratia marcescens NOT DETECTED NOT DETECTED Final   Haemophilus influenzae NOT DETECTED NOT DETECTED Final   Neisseria meningitidis NOT DETECTED NOT DETECTED Final   Pseudomonas aeruginosa NOT DETECTED NOT DETECTED Final   Stenotrophomonas maltophilia NOT DETECTED NOT DETECTED Final   Candida albicans NOT DETECTED NOT DETECTED Final   Candida auris NOT DETECTED NOT DETECTED Final   Candida glabrata NOT DETECTED NOT DETECTED Final   Candida krusei NOT DETECTED NOT DETECTED Final   Candida parapsilosis NOT DETECTED NOT DETECTED Final   Candida tropicalis  NOT DETECTED NOT DETECTED Final   Cryptococcus neoformans/gattii NOT DETECTED NOT DETECTED Final   Methicillin resistance mecA/C DETECTED (A) NOT DETECTED Final    Comment: CRITICAL RESULT CALLED TO, READ BACK BY AND VERIFIED WITH: NATHAN BELUE 12/08/2022 AT 0422 SRR Performed at [facility], [address] [place], [place] [address]   MRSA Next Gen by PCR, Nasal     Status: None   Collection Time: 12/07/22  4:42 PM   Specimen: Nasal Mucosa; Nasal Swab  Result Value Ref Range Status   MRSA by PCR Next Gen NOT DETECTED NOT DETECTED Final    Comment: (NOTE) The GeneXpert MRSA Assay (FDA approved for NASAL specimens only), is one component of a comprehensive MRSA colonization surveillance program. It is not intended to diagnose MRSA infection nor to guide or monitor treatment for MRSA infections. Test performance is not FDA approved in patients less than 62 years old. Performed at [facility], [place], [place] [address]   C Difficile Quick Screen w PCR reflex     Status: Abnormal   Collection Time: 12/07/22  5:30 PM   Specimen: STOOL  Result Value Ref Range Status   C Diff antigen POSITIVE (A) NEGATIVE Final   C Diff toxin NEGATIVE NEGATIVE Final   C Diff interpretation Results are indeterminate. See PCR results.  Final    Comment: Performed at [facility], [address] [place], [place] [address]  Gastrointestinal Panel by PCR , Stool     Status: None   Collection Time: 12/07/22  5:30 PM   Specimen: Stool  Result Value Ref Range Status   Campylobacter species NOT DETECTED NOT DETECTED Final   Plesimonas shigelloides NOT DETECTED NOT DETECTED Final   Salmonella species NOT DETECTED NOT DETECTED Final   Yersinia enterocolitica NOT DETECTED NOT DETECTED Final   Vibrio species NOT DETECTED NOT DETECTED Final   Vibrio cholerae NOT DETECTED NOT DETECTED Final   Enteroaggregative E coli (EAEC) NOT DETECTED NOT DETECTED Final    Enteropathogenic E coli (EPEC) NOT DETECTED NOT DETECTED Final   Enterotoxigenic E coli (ETEC) NOT DETECTED NOT DETECTED Final   Shiga like toxin producing E coli (STEC) NOT DETECTED NOT DETECTED Final   Shigella/Enteroinvasive E coli (EIEC) NOT DETECTED NOT DETECTED Final   Cryptosporidium NOT DETECTED NOT DETECTED Final   Cyclospora cayetanensis NOT DETECTED NOT DETECTED Final   Entamoeba histolytica NOT DETECTED NOT DETECTED Final   Giardia lamblia NOT DETECTED NOT DETECTED Final   Adenovirus F40/41 NOT DETECTED NOT  DETECTED Final   Astrovirus NOT DETECTED NOT DETECTED Final   Norovirus GI/GII NOT DETECTED NOT DETECTED Final   Rotavirus A NOT DETECTED NOT DETECTED Final   Sapovirus (I, II, IV, and V) NOT DETECTED NOT DETECTED Final    Comment: Performed at [facility], [address] [place], [place] [address]  C. Diff by PCR, Reflexed     Status: Abnormal   Collection Time: 12/07/22  5:30 PM  Result Value Ref Range Status   Toxigenic C. Difficile by PCR POSITIVE (A) NEGATIVE Final    Comment: Positive for toxigenic C. difficile with little to no toxin production. Only treat if clinical presentation suggests symptomatic illness. Performed at [facility], [address] [place],  [address]      Total time spend on discharging this patient, including the last patient exam, discussing the hospital stay, instructions for ongoing care as it relates to all pertinent caregivers, as well as preparing the medical discharge records, prescriptions, and/or referrals as applicable, is 30 minutes.    Enzo Bi, MD  [facility] 12/10/2022, 8:32 AM

## 2022-12-10 NOTE — Care Management Important Message (Signed)
Important Message  Patient Details  Name: ROMIL GILLY MRN: [id number] Date of Birth: Apr 26, 1931   Medicare Important Message Given:  Other (see comment)  Disposition to discharge with hospice services.  Medicare IM withheld at this time out of respect for patient and family.    Dannette Barbara 12/10/2022, 11:44 AM

## 2022-12-10 NOTE — Progress Notes (Signed)
Palliative: Cory [name] is lying quietly in bed being attended to by nursing staff.  He is more alert and interactive today.  There is no family at bedside at this time. Call to stepdaughter, Cory [name].  We talked about anticipated discharge today.  We talk about the benefits of hospice care.  Cory [name] states that she and the family would like "treat the treatable" hospice care when Cory [name] returns to long-term care at [facility].  Provider choice offered, they elect [facility].  Comfort with attending, bedside nursing staff, transition of care team, inpatient [facility] representative related to patient condition, needs, goals of care, disposition.  Plan:  Return to [facility] long-term care with [facility] "treat the treatable" hospice services.  Considering do not rehospitalize. DNR/goldenrod form completed and placed on chart.  70 minutes Cory Axe, NP Palliative medicine team Team phone [phone] Greater than 50% of this time was spent counseling and coordinating care related to the above assessment and plan.

## 2022-12-10 NOTE — Progress Notes (Signed)
Report called to Neoma Laming at [facility]. AVS printed, All belongings gathered. Leaving via EMS.

## 2022-12-10 NOTE — Progress Notes (Signed)
[employer]ufacturing [employer] [facility]) Hospital Liaison Note  Received request from PMT provider/T. Hulan Fray, NP for hospice services at [place] after discharge [facility] [place], aware. Chart and patient information under review by [facility] physician.   Spoke with daughter/[name] to initiate education related to hospice philosophy, services, and team approach to care. She verbalized understanding of information given. Per discussion, the plan is for patient to discharge home via ems once cleared to [place].    DME needs discussed and facility ([facility]) to provide.   Please send signed and completed DNR home with patient/family. Please provide prescriptions at discharge as needed to ensure ongoing symptom management.    [facility] information and contact numbers given to family & above information shared with TOC.   Please call with any questions/concerns.    Thank you for the opportunity to participate in this patient's care.   Phillis Haggis, MSW [facility] Liaison  [phone]

## 2022-12-12 LAB — CULTURE, BLOOD (ROUTINE X 2): Culture: NO GROWTH

## 2023-01-28 DEATH — deceased
